# Patient Record
Sex: Female | Born: 1975 | Race: White | Hispanic: No | Marital: Married | Smoking: Never smoker
Health system: Southern US, Community
[De-identification: ages and names within clinical notes are randomized; demographics above are authoritative.]

---

## 2003-12-05 ENCOUNTER — Other Ambulatory Visit: Admission: RE | Admit: 2003-12-05 | Discharge: 2003-12-05 | Payer: Self-pay | Admitting: Obstetrics and Gynecology

## 2005-02-15 ENCOUNTER — Other Ambulatory Visit: Admission: RE | Admit: 2005-02-15 | Discharge: 2005-02-15 | Payer: Self-pay | Admitting: Obstetrics and Gynecology

## 2008-07-31 ENCOUNTER — Inpatient Hospital Stay (HOSPITAL_COMMUNITY): Admission: AD | Admit: 2008-07-31 | Discharge: 2008-08-04 | Payer: Self-pay | Admitting: Obstetrics and Gynecology

## 2010-12-29 NOTE — Op Note (Signed)
Monique [name], Monique [name]            ACCOUNT NO.:  [id number]   MEDICAL RECORD NO.:  [id number]          PATIENT TYPE:  INP   LOCATION:  9123                          FACILITY:  [facility]   PHYSICIAN:  Hal Morales, M.D.DATE OF BIRTH:  1975/10/09   DATE OF PROCEDURE:  07/31/2008  DATE OF DISCHARGE:                               OPERATIVE REPORT   PREOPERATIVE DIAGNOSES:  1. Intrauterine pregnancy at 70 weeks' gestation.  2. Premature rupture of membranes.  3. Failure to progress in labor.   POSTOPERATIVE DIAGNOSES:  1. Intrauterine pregnancy at 43 weeks' gestation.  2. Premature rupture of membranes.  3. Failure to progress in labor.  4,  Asynclitism.   SURGERY:  Primary low transverse cesarean section.   SURGEON:  Vanessa P. Pennie Rushing, MD   FIRST ASSISTANT:  Renaldo Reel. Emilee Hero, certified nurse midwife.   ANESTHESIA:  Epidural.   ESTIMATED BLOOD LOSS:  800 mL.   COMPLICATIONS:  None.   FINDINGS:  The patient was delivered of a female infant weighing 6 pounds  1 ounce with Apgars of 9 and 9 at 1 at 5 minutes respectively.  The  tubes and ovaries were normal for the gravid state.  The uterus  contained a 4-cm right fundal myoma.  The skin contained a 2-mm variably  pigmented lesion in the midline approximately 2 cm above the incision.  The patient declined to have this lesion removed.   PROCEDURE:  The patient was taken to the operating room after  appropriate identification and placed on the operating table.  After the  attainment of adequate surgical anesthesia by dosing the patient's labor  epidural, the abdomen was prepped with multiple layers of Betadine and  draped as a sterile field.  The suprapubic region was infiltrated with  20 mL of 0.25% Marcaine after assurance of adequate surgical anesthesia.  A transverse incision was made and the abdomen opened in layers.  The  peritoneum was entered and the bladder blade placed.  The uterus was  incised approximately 2 cm above  the uterovesical fold and that incision  taken laterally on either side bluntly.  The infant was then delivered  from the occiput transverse position, and after having the nares and  pharynx suctioned and the cord clamped and cut, was handed off to the  awaiting pediatricians.  The placenta was allowed to spontaneously  detach from the uterus and was removed from the operative field, then  given to the employees of [facility] for cord blood  donation.  The uterine cavity was cleared of products of conception with  laparotomy pads.  The uterine incision was closed with a running  interlocking suture of 0 Vicryl.  An imbricating suture of 0 Vicryl was  then placed.  Copious irrigation was carried out.  The abdominal  peritoneum was closed with a running suture of 2-0 Vicryl.  The rectus  muscles were reapproximated in the midline with figure-of-eight suture  of 0 Vicryl.  The fascia was closed with a running suture of 0 Vicryl  then reinforced on either side of midline with figure-of-eight sutures  of 0  Vicryl.  The subcutaneous tissue was made hemostatic and irrigated.  The subcutaneous tissue was reapproximated with mattress sutures of 2-0  Vicryl.  The skin incision was reapproximated with subcuticular sutures  of 3-0 Monocryl.  Steri-Strips were applied as well as sterile dressing.  The patient was taken from the operating room to the recovery room in  satisfactory condition having tolerated the procedure well with sponge  and instrument counts correct.  The infant went to the full-term  nursery.      Hal Morales, M.D.  Electronically Signed     VPH/MEDQ  D:  08/01/2008  T:  08/01/2008  Job:  [id number]

## 2010-12-29 NOTE — Discharge Summary (Signed)
[name], Monique [name]            ACCOUNT NO.:  [id number]   MEDICAL RECORD NO.:  [id number]          PATIENT TYPE:  INP   LOCATION:  9123                          [facility]ACILITY:  [facility]   PHYSICIAN:  Janine Limbo, M.D.DATE OF BIRTH:  1976/06/11   DATE OF ADMISSION:  07/31/2008  DATE OF DISCHARGE:  08/04/2008                               DISCHARGE SUMMARY   DISCHARGING PHYSICIAN:  Sherron Monday, MD   ADMISSION DIAGNOSES:  1. Intrauterine pregnancy at 38 weeks, spontaneous rupture of      membranes.  2. Group B streptococcus negative.   DISCHARGE DIAGNOSES:  1. Intrauterine pregnancy at 38 weeks, spontaneous rupture of      membranes.  2. Group B streptococcus negative.  3. Failure to progress in labor.  4. Status post cesarean delivery of a female infant weighing 6 pounds 11      ounces, Apgars 9 and 9.   HOSPITAL PROCEDURES:  1. Epidural anesthesia.  2. Electronic fetal monitoring.  3. Low-dose Pitocin augmentation of labor.  4. Amnioinfusion.  5. Primary low transverse cesarean section of a female infant weighing 6      pounds 11 ounces, Apgars 9 and 9.   HOSPITAL COURSE:  The patient was admitted with rupture of membranes on  July 31, 2008.  Cervix was fingertip and 50% at that time.  She  began with observation and progressed to low-dose Pitocin augmentation.  She proceeded to 4 cm by middle of the day on July 31, 2008, and by  that evening got to 6 to 7 cm and then proceeded to 7 cm by 9:30 p.m.  and by midnight or so was found to be unchanged in her cervix, and  decision was made to proceed with cesarean section, which was performed  by Dr. Pennie Rushing under epidural anesthesia for a female infant weighing 6  pounds 11 ounces, Apgars 9 and 9.  She was taken to recovery and then to  [facility], where she received routine care.  On postoperative day #1,  she was doing well.  She was afebrile.  Hemoglobin was 10.4.  She was  breast-feeding.  She had some increased gas on  that day and was treated  with Dulcolax suppositories and ambulation.  On postop day #2, she was  doing better, passed gas and loose stools.  She was afebrile, and she  continued to breast-feed and ambulate.  On postop day #3, she was ready  to go home, breast-feeding well.  Vital signs were stable.  Chest was  clear.  Heart: Regular rate and rhythm.  Abdomen was soft and  appropriately tender.  Incision was clean, dry, and intact.  Lochia was  normal, and she was deemed to have received full benefit of her hospital  stay and was discharged home.   DISCHARGE MEDICATIONS:  1. Motrin 600 mg p.o. q.6 h. p.r.n.  2. Tylox 1-2 p.o. q.4 h. p.r.n.   DISCHARGE LABORATORIES:  White blood cell count 12.3, hemoglobin 10.4,  and platelets 184.   DISCHARGE INSTRUCTIONS:  Per [facility] handout.  Discharge followup in 6 weeks  or p.r.n. and condition at  discharge is good.      Marie L. Mayford Knife, C.N.M.      ______________________________  Janine Limbo, M.D.    MLW/MEDQ  D:  08/04/2008  T:  08/04/2008  Job:  [id number]

## 2010-12-29 NOTE — H&P (Signed)
NAMEJOANY, KHATIB            ACCOUNT NO.:  [id number]   MEDICAL RECORD NO.:  [id number]         PATIENT TYPE:  WINP   LOCATION:  170                           [facility]ILITY:  [facility]   PHYSICIAN:  Janine Limbo, M.D.DATE OF BIRTH:  06-13-1976   DATE OF ADMISSION:  07/31/2008  DATE OF DISCHARGE:                              HISTORY & PHYSICAL   Monique [name] is a 35 year old gravida 1, para 0 at 56 weeks who presents  status post spontaneous rupture of membranes at 2:00 a.m. with clear  fluid noted and uterine contractions, unknown status.  She reports  positive fetal movement.  She denies bleeding.  She was seen in the  office on July 29, 2008, with a mild elevation of her blood  pressure.  She was to have a recheck on August 01, 2008.  She denies  headache, visual symptoms or epigastric pain.  Pregnancy has been  remarkable for:  1. Group B strep negative.  2. Fibroid noted on 18-week ultrasound.   PRENATAL LABS:  Blood type is O+, Rh antibody negative, VDRL  nonreactive, rubella titer positive, hepatitis B surface antigen  negative, HIV was nonreactive.  GC and Chlamydia cultures were declined  in the first and third trimester.  Cystic fibrosis testing was declined.  Hemoglobin upon entering the practice was 12.8 and was 11.3 at 27 weeks.  She declined first trimester screening.  She had a normal Glucola.  She  had a negative group B strep.   HISTORY OF PRESENT PREGNANCY:  The patient entered care at approximately  10 weeks.  She traveled to [place] after her first visit for  approximately 2 months.  She saw a family practitioner there for visits  during that time away.  We did an ultrasound at her second visit prior  to her leaving for dating purposes and verification of status.  It  confirmed an [facility] of August 15, 2008.  She declined genetic screening.  She had a 4 cm fibroid noted on that 12-week ultrasound.  She returned  in August at 19 weeks.  She had her anatomy  scan at that time showing an  anterior placenta, normal cervical length, normal growth and  development.  The fibroid had not changed in size since her last exam.  She had a normal Glucola and hemoglobin was 11.3 at 29 weeks.  She  declined H1N1 vaccine.  Group B strep culture was negative at 35 weeks.  At 37-5/7 weeks, she was seen in the office and had an initial blood  pressure of 120/100, but then had a repeat of 120/80.  She had no  protein in her urine.  She had no headache, visual symptoms or  epigastric pain.  The cervix at that time was fingertip, vertex, -2 to -  3.  She was placed on increased rest and was planned to return on  August 01, 2008, for recheck and there was an ultrasound ordered for  position.   OBSTETRICAL HISTORY:  The patient is a primigravida.   MEDICAL HISTORY:  She has history of the usual childhood illnesses.  She  has an indoor  cataract, but does not have any litter.   SURGICAL HISTORY:  Adenoids removed and tube in her ears at age 33.   ALLERGIES:  SHE HAS NO KNOWN MEDICATION ALLERGIES.   FAMILY HISTORY:  Her father has varicose veins.  Her mother had  lymphoma.   GENETIC HISTORY:  Unremarkable.   SOCIAL HISTORY:  The patient is married to the father of baby.  He is  involved and supportive, his name is Monique [name].  The patient is  Caucasian.  She denies a religious affiliation.  She is from [place].  The patient has her bachelor's degree, she is a Runner, broadcasting/film/video.  Her husband  has some college, but he is currently unemployed.  Patient denies any  alcohol, drug or tobacco use during this pregnancy.  She has been  followed by the certified nurse midwife service at [facility].   PHYSICAL EXAMINATION:  VITAL SIGNS:  Blood pressure is 124/72 and  132/85.  Other vital signs are stable.  HEENT:  Within normal limits.  LUNGS:  Breath sounds are clear.  HEART:  Regular rate and rhythm without murmur.  BREASTS:  Soft and nontender.  ABDOMEN:   Fundal height is approximately 38 cm.  Estimated fetal weight  is 7 pounds.  Uterine contractions are every 3 minutes, mild quality.  The patient is known to be leaking clear fluid with positive fern,  positive pooling and positive Nitrazine.  Fetal heart rate is reactive  with no decelerations.  There is a negative spontaneous CST.  The cervix  is a fingertip, 50%, vertex, -2 with vertex verified by bedside  ultrasound.  Urine is negative for protein.  Deep tendon reflexes are 2+  without clonus.  There is no edema noted.   IMPRESSION:  1. Intrauterine pregnancy at 38 weeks.  2. Spontaneous rupture of membranes with early labor.  3. Group B strep negative.   PLAN:  1. Admit to birthing suite per consult with Dr. Stefano Gaul as attending      physician.  2. Routine certified nurse midwife orders.  3. Will observe at present and augment p.r.n.  The patient understands      that we may need to augment if no change in labor status.  4. Declines need for pain medication at present, but is open to using      pain medication as labor advances.      Monique [name], C.N.M.      Janine Limbo, M.D.  Electronically Signed    VLL/MEDQ  D:  07/31/2008  T:  07/31/2008  Job:  [id number]

## 2011-05-21 LAB — URINALYSIS, ROUTINE W REFLEX MICROSCOPIC
Glucose, UA: NEGATIVE mg/dL
pH: 5.5 (ref 5.0–8.0)

## 2011-05-21 LAB — CBC
Hemoglobin: 10.4 g/dL — ABNORMAL LOW (ref 12.0–15.0)
MCHC: 33.8 g/dL (ref 30.0–36.0)
Platelets: 219 10*3/uL (ref 150–400)
RBC: 3.42 MIL/uL — ABNORMAL LOW (ref 3.87–5.11)
RDW: 13.8 % (ref 11.5–15.5)
WBC: 12.3 10*3/uL — ABNORMAL HIGH (ref 4.0–10.5)
WBC: 6.3 10*3/uL (ref 4.0–10.5)

## 2011-05-21 LAB — URINE MICROSCOPIC-ADD ON

## 2011-05-21 LAB — RPR: RPR Ser Ql: NONREACTIVE

## 2015-08-26 ENCOUNTER — Other Ambulatory Visit: Payer: Self-pay | Admitting: Family Medicine

## 2015-08-26 ENCOUNTER — Ambulatory Visit
Admission: RE | Admit: 2015-08-26 | Discharge: 2015-08-26 | Disposition: A | Discharge status: Home / Self Care | Payer: BC Managed Care – PPO | Source: Ambulatory Visit | Attending: Family Medicine | Admitting: Family Medicine

## 2015-08-26 DIAGNOSIS — R062 Wheezing: Secondary | ICD-10-CM

## 2015-08-29 ENCOUNTER — Emergency Department (HOSPITAL_COMMUNITY): Payer: BC Managed Care – PPO

## 2015-08-29 ENCOUNTER — Inpatient Hospital Stay (HOSPITAL_COMMUNITY)
Admission: EM | Admit: 2015-08-29 | Discharge: 2015-10-15 | DRG: 853 | Disposition: E | Payer: BC Managed Care – PPO | Attending: Pulmonary Disease | Admitting: Pulmonary Disease

## 2015-08-29 ENCOUNTER — Encounter (HOSPITAL_COMMUNITY): Payer: Self-pay | Admitting: Emergency Medicine

## 2015-08-29 DIAGNOSIS — R918 Other nonspecific abnormal finding of lung field: Secondary | ICD-10-CM | POA: Diagnosis not present

## 2015-08-29 DIAGNOSIS — IMO0002 Reserved for concepts with insufficient information to code with codable children: Secondary | ICD-10-CM

## 2015-08-29 DIAGNOSIS — J9602 Acute respiratory failure with hypercapnia: Secondary | ICD-10-CM | POA: Diagnosis not present

## 2015-08-29 DIAGNOSIS — D630 Anemia in neoplastic disease: Secondary | ICD-10-CM | POA: Diagnosis present

## 2015-08-29 DIAGNOSIS — J90 Pleural effusion, not elsewhere classified: Secondary | ICD-10-CM | POA: Diagnosis not present

## 2015-08-29 DIAGNOSIS — Z515 Encounter for palliative care: Secondary | ICD-10-CM | POA: Diagnosis present

## 2015-08-29 DIAGNOSIS — Z807 Family history of other malignant neoplasms of lymphoid, hematopoietic and related tissues: Secondary | ICD-10-CM

## 2015-08-29 DIAGNOSIS — N179 Acute kidney failure, unspecified: Secondary | ICD-10-CM

## 2015-08-29 DIAGNOSIS — I468 Cardiac arrest due to other underlying condition: Secondary | ICD-10-CM | POA: Diagnosis not present

## 2015-08-29 DIAGNOSIS — R531 Weakness: Secondary | ICD-10-CM

## 2015-08-29 DIAGNOSIS — E878 Other disorders of electrolyte and fluid balance, not elsewhere classified: Secondary | ICD-10-CM | POA: Diagnosis present

## 2015-08-29 DIAGNOSIS — Z4659 Encounter for fitting and adjustment of other gastrointestinal appliance and device: Secondary | ICD-10-CM

## 2015-08-29 DIAGNOSIS — Z452 Encounter for adjustment and management of vascular access device: Secondary | ICD-10-CM

## 2015-08-29 DIAGNOSIS — E8770 Fluid overload, unspecified: Secondary | ICD-10-CM | POA: Diagnosis present

## 2015-08-29 DIAGNOSIS — R0902 Hypoxemia: Secondary | ICD-10-CM

## 2015-08-29 DIAGNOSIS — C3490 Malignant neoplasm of unspecified part of unspecified bronchus or lung: Secondary | ICD-10-CM

## 2015-08-29 DIAGNOSIS — J9601 Acute respiratory failure with hypoxia: Secondary | ICD-10-CM

## 2015-08-29 DIAGNOSIS — R591 Generalized enlarged lymph nodes: Secondary | ICD-10-CM | POA: Insufficient documentation

## 2015-08-29 DIAGNOSIS — R0600 Dyspnea, unspecified: Secondary | ICD-10-CM | POA: Diagnosis not present

## 2015-08-29 DIAGNOSIS — C801 Malignant (primary) neoplasm, unspecified: Secondary | ICD-10-CM

## 2015-08-29 DIAGNOSIS — Y95 Nosocomial condition: Secondary | ICD-10-CM | POA: Diagnosis present

## 2015-08-29 DIAGNOSIS — G6281 Critical illness polyneuropathy: Secondary | ICD-10-CM | POA: Diagnosis not present

## 2015-08-29 DIAGNOSIS — C7989 Secondary malignant neoplasm of other specified sites: Secondary | ICD-10-CM | POA: Diagnosis present

## 2015-08-29 DIAGNOSIS — C771 Secondary and unspecified malignant neoplasm of intrathoracic lymph nodes: Secondary | ICD-10-CM | POA: Diagnosis present

## 2015-08-29 DIAGNOSIS — J9600 Acute respiratory failure, unspecified whether with hypoxia or hypercapnia: Secondary | ICD-10-CM

## 2015-08-29 DIAGNOSIS — R599 Enlarged lymph nodes, unspecified: Secondary | ICD-10-CM | POA: Insufficient documentation

## 2015-08-29 DIAGNOSIS — B3789 Other sites of candidiasis: Secondary | ICD-10-CM | POA: Diagnosis not present

## 2015-08-29 DIAGNOSIS — R229 Localized swelling, mass and lump, unspecified: Secondary | ICD-10-CM

## 2015-08-29 DIAGNOSIS — I959 Hypotension, unspecified: Secondary | ICD-10-CM | POA: Diagnosis not present

## 2015-08-29 DIAGNOSIS — G709 Myoneural disorder, unspecified: Secondary | ICD-10-CM | POA: Insufficient documentation

## 2015-08-29 DIAGNOSIS — E43 Unspecified severe protein-calorie malnutrition: Secondary | ICD-10-CM | POA: Diagnosis present

## 2015-08-29 DIAGNOSIS — C799 Secondary malignant neoplasm of unspecified site: Secondary | ICD-10-CM | POA: Insufficient documentation

## 2015-08-29 DIAGNOSIS — B370 Candidal stomatitis: Secondary | ICD-10-CM | POA: Diagnosis not present

## 2015-08-29 DIAGNOSIS — R197 Diarrhea, unspecified: Secondary | ICD-10-CM | POA: Diagnosis present

## 2015-08-29 DIAGNOSIS — A419 Sepsis, unspecified organism: Principal | ICD-10-CM | POA: Diagnosis present

## 2015-08-29 DIAGNOSIS — B3749 Other urogenital candidiasis: Secondary | ICD-10-CM | POA: Diagnosis not present

## 2015-08-29 DIAGNOSIS — C7A8 Other malignant neuroendocrine tumors: Secondary | ICD-10-CM | POA: Diagnosis present

## 2015-08-29 DIAGNOSIS — I82402 Acute embolism and thrombosis of unspecified deep veins of left lower extremity: Secondary | ICD-10-CM | POA: Diagnosis not present

## 2015-08-29 DIAGNOSIS — G9340 Encephalopathy, unspecified: Secondary | ICD-10-CM | POA: Diagnosis not present

## 2015-08-29 DIAGNOSIS — Z8249 Family history of ischemic heart disease and other diseases of the circulatory system: Secondary | ICD-10-CM

## 2015-08-29 DIAGNOSIS — T8579XA Infection and inflammatory reaction due to other internal prosthetic devices, implants and grafts, initial encounter: Secondary | ICD-10-CM

## 2015-08-29 DIAGNOSIS — Z79899 Other long term (current) drug therapy: Secondary | ICD-10-CM

## 2015-08-29 DIAGNOSIS — Z789 Other specified health status: Secondary | ICD-10-CM | POA: Insufficient documentation

## 2015-08-29 DIAGNOSIS — J9690 Respiratory failure, unspecified, unspecified whether with hypoxia or hypercapnia: Secondary | ICD-10-CM

## 2015-08-29 DIAGNOSIS — Z87891 Personal history of nicotine dependence: Secondary | ICD-10-CM

## 2015-08-29 DIAGNOSIS — D6959 Other secondary thrombocytopenia: Secondary | ICD-10-CM | POA: Diagnosis present

## 2015-08-29 DIAGNOSIS — R000 Tachycardia, unspecified: Secondary | ICD-10-CM

## 2015-08-29 DIAGNOSIS — N170 Acute kidney failure with tubular necrosis: Secondary | ICD-10-CM | POA: Diagnosis present

## 2015-08-29 DIAGNOSIS — E876 Hypokalemia: Secondary | ICD-10-CM | POA: Diagnosis present

## 2015-08-29 DIAGNOSIS — J189 Pneumonia, unspecified organism: Secondary | ICD-10-CM | POA: Diagnosis present

## 2015-08-29 DIAGNOSIS — N858 Other specified noninflammatory disorders of uterus: Secondary | ICD-10-CM

## 2015-08-29 DIAGNOSIS — D61810 Antineoplastic chemotherapy induced pancytopenia: Secondary | ICD-10-CM | POA: Diagnosis not present

## 2015-08-29 DIAGNOSIS — E874 Mixed disorder of acid-base balance: Secondary | ICD-10-CM | POA: Diagnosis present

## 2015-08-29 DIAGNOSIS — E870 Hyperosmolality and hypernatremia: Secondary | ICD-10-CM | POA: Diagnosis present

## 2015-08-29 DIAGNOSIS — Z7951 Long term (current) use of inhaled steroids: Secondary | ICD-10-CM

## 2015-08-29 DIAGNOSIS — C772 Secondary and unspecified malignant neoplasm of intra-abdominal lymph nodes: Secondary | ICD-10-CM | POA: Diagnosis present

## 2015-08-29 DIAGNOSIS — C7800 Secondary malignant neoplasm of unspecified lung: Secondary | ICD-10-CM | POA: Diagnosis present

## 2015-08-29 DIAGNOSIS — K567 Ileus, unspecified: Secondary | ICD-10-CM

## 2015-08-29 DIAGNOSIS — L89152 Pressure ulcer of sacral region, stage 2: Secondary | ICD-10-CM | POA: Diagnosis present

## 2015-08-29 DIAGNOSIS — T451X5A Adverse effect of antineoplastic and immunosuppressive drugs, initial encounter: Secondary | ICD-10-CM | POA: Diagnosis not present

## 2015-08-29 DIAGNOSIS — Z978 Presence of other specified devices: Secondary | ICD-10-CM

## 2015-08-29 DIAGNOSIS — D72829 Elevated white blood cell count, unspecified: Secondary | ICD-10-CM | POA: Diagnosis present

## 2015-08-29 DIAGNOSIS — Z66 Do not resuscitate: Secondary | ICD-10-CM | POA: Diagnosis present

## 2015-08-29 DIAGNOSIS — I82621 Acute embolism and thrombosis of deep veins of right upper extremity: Secondary | ICD-10-CM | POA: Diagnosis not present

## 2015-08-29 DIAGNOSIS — D72828 Other elevated white blood cell count: Secondary | ICD-10-CM | POA: Diagnosis not present

## 2015-08-29 DIAGNOSIS — G893 Neoplasm related pain (acute) (chronic): Secondary | ICD-10-CM | POA: Diagnosis present

## 2015-08-29 DIAGNOSIS — T17890A Other foreign object in other parts of respiratory tract causing asphyxiation, initial encounter: Secondary | ICD-10-CM | POA: Diagnosis not present

## 2015-08-29 DIAGNOSIS — I82409 Acute embolism and thrombosis of unspecified deep veins of unspecified lower extremity: Secondary | ICD-10-CM

## 2015-08-29 DIAGNOSIS — C7951 Secondary malignant neoplasm of bone: Secondary | ICD-10-CM | POA: Diagnosis present

## 2015-08-29 DIAGNOSIS — R29898 Other symptoms and signs involving the musculoskeletal system: Secondary | ICD-10-CM | POA: Insufficient documentation

## 2015-08-29 DIAGNOSIS — R4182 Altered mental status, unspecified: Secondary | ICD-10-CM

## 2015-08-29 DIAGNOSIS — R569 Unspecified convulsions: Secondary | ICD-10-CM | POA: Diagnosis not present

## 2015-08-29 DIAGNOSIS — R34 Anuria and oliguria: Secondary | ICD-10-CM | POA: Diagnosis present

## 2015-08-29 DIAGNOSIS — R6521 Severe sepsis with septic shock: Secondary | ICD-10-CM | POA: Diagnosis present

## 2015-08-29 DIAGNOSIS — L8990 Pressure ulcer of unspecified site, unspecified stage: Secondary | ICD-10-CM | POA: Insufficient documentation

## 2015-08-29 LAB — CBC WITH DIFFERENTIAL/PLATELET
BASOS ABS: 0 10*3/uL (ref 0.0–0.1)
Basophils Relative: 0 %
EOS ABS: 0 10*3/uL (ref 0.0–0.7)
EOS PCT: 0 %
HCT: 39.3 % (ref 36.0–46.0)
Hemoglobin: 13 g/dL (ref 12.0–15.0)
Lymphocytes Relative: 5 %
Lymphs Abs: 0.9 10*3/uL (ref 0.7–4.0)
MCH: 27.4 pg (ref 26.0–34.0)
MCHC: 33.1 g/dL (ref 30.0–36.0)
MCV: 82.7 fL (ref 78.0–100.0)
MONO ABS: 1.8 10*3/uL — AB (ref 0.1–1.0)
Monocytes Relative: 11 %
Neutro Abs: 14.1 10*3/uL — ABNORMAL HIGH (ref 1.7–7.7)
Neutrophils Relative %: 84 %
PLATELETS: 461 10*3/uL — AB (ref 150–400)
RBC: 4.75 MIL/uL (ref 3.87–5.11)
RDW: 13.4 % (ref 11.5–15.5)
WBC: 16.9 10*3/uL — AB (ref 4.0–10.5)

## 2015-08-29 LAB — COMPREHENSIVE METABOLIC PANEL
ALT: 16 U/L (ref 14–54)
AST: 68 U/L — AB (ref 15–41)
Albumin: 3.6 g/dL (ref 3.5–5.0)
Alkaline Phosphatase: 70 U/L (ref 38–126)
Anion gap: 14 (ref 5–15)
BUN: 15 mg/dL (ref 6–20)
CHLORIDE: 100 mmol/L — AB (ref 101–111)
CO2: 22 mmol/L (ref 22–32)
CREATININE: 0.8 mg/dL (ref 0.44–1.00)
Calcium: 9.7 mg/dL (ref 8.9–10.3)
Glucose, Bld: 119 mg/dL — ABNORMAL HIGH (ref 65–99)
POTASSIUM: 4.1 mmol/L (ref 3.5–5.1)
SODIUM: 136 mmol/L (ref 135–145)
Total Bilirubin: 1.2 mg/dL (ref 0.3–1.2)
Total Protein: 7.4 g/dL (ref 6.5–8.1)

## 2015-08-29 LAB — I-STAT CG4 LACTIC ACID, ED
LACTIC ACID, VENOUS: 2.94 mmol/L — AB (ref 0.5–2.0)
Lactic Acid, Venous: 2.17 mmol/L (ref 0.5–2.0)

## 2015-08-29 MED ORDER — SODIUM CHLORIDE 0.9 % IV BOLUS (SEPSIS)
500.0000 mL | Freq: Once | INTRAVENOUS | Status: AC
  Administered 2015-08-29: 500 mL via INTRAVENOUS

## 2015-08-29 MED ORDER — CEFTRIAXONE SODIUM 1 G IJ SOLR
1.0000 g | Freq: Once | INTRAMUSCULAR | Status: AC
  Administered 2015-08-29: 1 g via INTRAVENOUS
  Filled 2015-08-29: qty 10

## 2015-08-29 MED ORDER — ALBUTEROL (5 MG/ML) CONTINUOUS INHALATION SOLN
10.0000 mg/h | INHALATION_SOLUTION | Freq: Once | RESPIRATORY_TRACT | Status: AC
  Administered 2015-08-29: 10 mg/h via RESPIRATORY_TRACT
  Filled 2015-08-29: qty 20

## 2015-08-29 MED ORDER — SODIUM CHLORIDE 0.9 % IV SOLN
INTRAVENOUS | Status: DC
  Administered 2015-08-29: 18:00:00 via INTRAVENOUS

## 2015-08-29 MED ORDER — IOHEXOL 350 MG/ML SOLN
100.0000 mL | Freq: Once | INTRAVENOUS | Status: AC | PRN
  Administered 2015-08-29: 100 mL via INTRAVENOUS

## 2015-08-29 MED ORDER — AZITHROMYCIN 500 MG IV SOLR
500.0000 mg | Freq: Once | INTRAVENOUS | Status: AC
  Administered 2015-08-29: 500 mg via INTRAVENOUS
  Filled 2015-08-29: qty 500

## 2015-08-29 NOTE — ED Notes (Signed)
Pt, being sent by [facility], c/o SOB x weeks and PNA x 4 days.  O2 Sat 91% RA.  Pt given a breathing treatment in office.  Pt has been taking prescriptions w/o relief.

## 2015-08-29 NOTE — ED Provider Notes (Signed)
CSN: [id number]     Arrival date & time 08/25/2015  1354 History   First MD Initiated Contact with Patient 09/03/2015 1636     Chief Complaint  Patient presents with  . PNA      HPI Pt, being sent by [facility], c/o SOB x weeks and PNA x 4 days. O2 Sat 91% RA. Pt given a breathing treatment in office. Pt has been taking prescriptions w/o relief. History reviewed. No pertinent past medical history. History reviewed. No pertinent past surgical history. Family History  Problem Relation Age of Onset  . Lymphoma Mother   . CAD Mother    Social History  Substance Use Topics  . Smoking status: Never Smoker   . Smokeless tobacco: None  . Alcohol Use: Yes     Comment: rare   OB History    No data available     Review of Systems  All other systems reviewed and are negative  Allergies  Review of patient's allergies indicates no known allergies.  Home Medications   Prior to Admission medications   Medication Sig Start Date End Date Taking? Authorizing Provider  albuterol (PROVENTIL HFA;VENTOLIN HFA) 108 (90 Base) MCG/ACT inhaler Inhale 2 puffs into the lungs every 4 (four) hours as needed for wheezing or shortness of breath.   Yes Historical Provider, MD  Ascorbic Acid (VITAMIN C PO) Take 1 tablet by mouth daily.   Yes Historical Provider, MD  budesonide-formoterol (SYMBICORT) 160-4.5 MCG/ACT inhaler Inhale 2 puffs into the lungs 2 (two) times daily.   Yes Historical Provider, MD  Cholecalciferol (VITAMIN D PO) Take 1 tablet by mouth daily.   Yes Historical Provider, MD  clarithromycin (BIAXIN) 500 MG tablet Take 500 mg by mouth 2 (two) times daily. For 10 days 08/26/15-09/05/15 08/26/15  Yes Historical Provider, MD  levofloxacin (LEVAQUIN) 500 MG tablet Take 500 mg by mouth daily. Reported on 08/28/2015 08/18/15   Historical Provider, MD   BP 115/68 mmHg  Pulse 134  Temp(Src) 98.7 F (37.1 C) (Axillary)  Resp 20  Ht 5' 5.5" (1.664 m)  Wt 190 lb 0.6 oz (86.2 kg)  BMI 31.13 kg/m2  SpO2  93%  LMP 08/17/2015 Physical Exam Physical Exam  Nursing note and vitals reviewed. Constitutional: She is oriented to person, place, and time. She appears well-developed and well-nourished. No distress.  HENT:  Head: Normocephalic and atraumatic.  Eyes: Pupils are equal, round, and reactive to light.  Neck: Normal range of motion.  Cardiovascular: Normal rate and intact distal pulses.   Pulmonary/Chest: No respiratory distress.  scattered expiratory wheezes to auscultation. Abdominal: Normal appearance. She exhibits no distension.  Musculoskeletal: Normal range of motion.  Neurological: She is alert and oriented to person, place, and time. No cranial nerve deficit.  Skin: Skin is warm and dry. No rash noted.  Psychiatric: She has a normal mood and affect. Her behavior is normal.   ED Course  Procedures (including critical care time) Labs Review Labs Reviewed  CBC WITH DIFFERENTIAL/PLATELET - Abnormal; Notable for the following:    WBC 16.9 (*)    Platelets 461 (*)    Neutro Abs 14.1 (*)    Monocytes Absolute 1.8 (*)    All other components within normal limits  COMPREHENSIVE METABOLIC PANEL - Abnormal; Notable for the following:    Chloride 100 (*)    Glucose, Bld 119 (*)    AST 68 (*)    All other components within normal limits  COMPREHENSIVE METABOLIC PANEL - Abnormal; Notable for  the following:    Sodium 132 (*)    Chloride 97 (*)    Albumin 3.3 (*)    AST 63 (*)    Total Bilirubin 1.4 (*)    All other components within normal limits  CBC - Abnormal; Notable for the following:    WBC 17.4 (*)    All other components within normal limits  CA 125 - Abnormal; Notable for the following:    CA 125 780.8 (*)    All other components within normal limits  CANCER ANTIGEN 19-9 - Abnormal; Notable for the following:    CA 19-9 453 (*)    All other components within normal limits  COMPREHENSIVE METABOLIC PANEL - Abnormal; Notable for the following:    Chloride 95 (*)     Glucose, Bld 123 (*)    Albumin 2.7 (*)    AST 63 (*)    All other components within normal limits  CBC WITH DIFFERENTIAL/PLATELET - Abnormal; Notable for the following:    WBC 18.7 (*)    Hemoglobin 10.8 (*)    HCT 33.4 (*)    Platelets 405 (*)    Neutro Abs 16.2 (*)    Monocytes Absolute 1.6 (*)    All other components within normal limits  BASIC METABOLIC PANEL - Abnormal; Notable for the following:    Sodium 134 (*)    Potassium 3.2 (*)    Chloride 91 (*)    Glucose, Bld 106 (*)    Anion gap 16 (*)    All other components within normal limits  CBC - Abnormal; Notable for the following:    WBC 17.7 (*)    Hemoglobin 10.7 (*)    HCT 33.5 (*)    Platelets 416 (*)    All other components within normal limits  LACTATE DEHYDROGENASE - Abnormal; Notable for the following:    LDH 554 (*)    All other components within normal limits  CBC - Abnormal; Notable for the following:    WBC 17.5 (*)    Hemoglobin 10.8 (*)    HCT 34.1 (*)    Platelets 408 (*)    All other components within normal limits  BASIC METABOLIC PANEL - Abnormal; Notable for the following:    Sodium 134 (*)    Chloride 91 (*)    Glucose, Bld 102 (*)    Anion gap 16 (*)    All other components within normal limits  CBC - Abnormal; Notable for the following:    WBC 18.0 (*)    Hemoglobin 10.8 (*)    HCT 34.1 (*)    Platelets 406 (*)    All other components within normal limits  TRIGLYCERIDES - Abnormal; Notable for the following:    Triglycerides 170 (*)    All other components within normal limits  BLOOD GAS, ARTERIAL - Abnormal; Notable for the following:    pO2, Arterial 142 (*)    Bicarbonate 28.0 (*)    Acid-Base Excess 4.1 (*)    All other components within normal limits  BASIC METABOLIC PANEL - Abnormal; Notable for the following:    Chloride 97 (*)    Glucose, Bld 101 (*)    BUN 31 (*)    Anion gap 16 (*)    All other components within normal limits  CBC - Abnormal; Notable for the  following:    WBC 16.4 (*)    Hemoglobin 10.8 (*)    HCT 34.7 (*)    Platelets 408 (*)  All other components within normal limits  BLOOD GAS, ARTERIAL - Abnormal; Notable for the following:    pH, Arterial 7.518 (*)    pCO2 arterial 33.4 (*)    pO2, Arterial 52.4 (*)    Bicarbonate 27.0 (*)    Acid-Base Excess 4.5 (*)    All other components within normal limits  BLOOD GAS, ARTERIAL - Abnormal; Notable for the following:    pH, Arterial 7.474 (*)    pO2, Arterial 60.9 (*)    Bicarbonate 27.9 (*)    Acid-Base Excess 4.5 (*)    All other components within normal limits  GLUCOSE, CAPILLARY - Abnormal; Notable for the following:    Glucose-Capillary 108 (*)    All other components within normal limits  BASIC METABOLIC PANEL - Abnormal; Notable for the following:    Chloride 99 (*)    Glucose, Bld 144 (*)    BUN 58 (*)    Creatinine, Ser 1.70 (*)    GFR calc non Af Amer 37 (*)    GFR calc Af Amer 42 (*)    All other components within normal limits  CBC - Abnormal; Notable for the following:    WBC 21.1 (*)    RBC 3.86 (*)    Hemoglobin 10.2 (*)    HCT 32.8 (*)    All other components within normal limits  GLUCOSE, CAPILLARY - Abnormal; Notable for the following:    Glucose-Capillary 132 (*)    All other components within normal limits  GLUCOSE, CAPILLARY - Abnormal; Notable for the following:    Glucose-Capillary 103 (*)    All other components within normal limits  GLUCOSE, CAPILLARY - Abnormal; Notable for the following:    Glucose-Capillary 123 (*)    All other components within normal limits  URINALYSIS, ROUTINE W REFLEX MICROSCOPIC (NOT AT [facility]) - Abnormal; Notable for the following:    APPearance CLOUDY (*)    Hgb urine dipstick MODERATE (*)    Leukocytes, UA SMALL (*)    All other components within normal limits  LACTIC ACID, PLASMA - Abnormal; Notable for the following:    Lactic Acid, Venous 3.2 (*)    All other components within normal limits  GLUCOSE,  CAPILLARY - Abnormal; Notable for the following:    Glucose-Capillary 136 (*)    All other components within normal limits  URINE MICROSCOPIC-ADD ON - Abnormal; Notable for the following:    Squamous Epithelial / LPF 0-5 (*)    Bacteria, UA FEW (*)    All other components within normal limits  GLUCOSE, CAPILLARY - Abnormal; Notable for the following:    Glucose-Capillary 138 (*)    All other components within normal limits  HEPATIC FUNCTION PANEL - Abnormal; Notable for the following:    Total Protein 5.8 (*)    Albumin 1.7 (*)    AST 128 (*)    Alkaline Phosphatase 131 (*)    Total Bilirubin 1.5 (*)    Bilirubin, Direct 0.9 (*)    All other components within normal limits  LIPASE, BLOOD - Abnormal; Notable for the following:    Lipase 134 (*)    All other components within normal limits  LACTIC ACID, PLASMA - Abnormal; Notable for the following:    Lactic Acid, Venous 2.2 (*)    All other components within normal limits  GLUCOSE, CAPILLARY - Abnormal; Notable for the following:    Glucose-Capillary 139 (*)    All other components within normal limits  TRIGLYCERIDES - Abnormal;  Notable for the following:    Triglycerides 319 (*)    All other components within normal limits  GLUCOSE, CAPILLARY - Abnormal; Notable for the following:    Glucose-Capillary 129 (*)    All other components within normal limits  GLUCOSE, CAPILLARY - Abnormal; Notable for the following:    Glucose-Capillary 366 (*)    All other components within normal limits  GLUCOSE, CAPILLARY - Abnormal; Notable for the following:    Glucose-Capillary 143 (*)    All other components within normal limits  CBC - Abnormal; Notable for the following:    WBC 20.0 (*)    RBC 3.50 (*)    Hemoglobin 9.3 (*)    HCT 31.0 (*)    All other components within normal limits  BASIC METABOLIC PANEL - Abnormal; Notable for the following:    Sodium 148 (*)    Glucose, Bld 150 (*)    BUN 66 (*)    Creatinine, Ser 1.44 (*)     Calcium 8.8 (*)    GFR calc non Af Amer 45 (*)    GFR calc Af Amer 52 (*)    All other components within normal limits  LACTIC ACID, PLASMA - Abnormal; Notable for the following:    Lactic Acid, Venous 2.6 (*)    All other components within normal limits  TROPONIN I - Abnormal; Notable for the following:    Troponin I 0.09 (*)    All other components within normal limits  TROPONIN I - Abnormal; Notable for the following:    Troponin I 0.09 (*)    All other components within normal limits  TROPONIN I - Abnormal; Notable for the following:    Troponin I 0.11 (*)    All other components within normal limits  GLUCOSE, CAPILLARY - Abnormal; Notable for the following:    Glucose-Capillary 137 (*)    All other components within normal limits  GLUCOSE, CAPILLARY - Abnormal; Notable for the following:    Glucose-Capillary 125 (*)    All other components within normal limits  BLOOD GAS, ARTERIAL - Abnormal; Notable for the following:    pH, Arterial 7.309 (*)    pCO2 arterial 55.0 (*)    pO2, Arterial 51.0 (*)    Bicarbonate 25.9 (*)    All other components within normal limits  GLUCOSE, CAPILLARY - Abnormal; Notable for the following:    Glucose-Capillary 129 (*)    All other components within normal limits  CBC - Abnormal; Notable for the following:    WBC 18.7 (*)    RBC 3.45 (*)    Hemoglobin 9.3 (*)    HCT 29.9 (*)    All other components within normal limits  BASIC METABOLIC PANEL - Abnormal; Notable for the following:    Sodium 154 (*)    Chloride 113 (*)    Glucose, Bld 128 (*)    BUN 64 (*)    Creatinine, Ser 1.36 (*)    Calcium 8.3 (*)    GFR calc non Af Amer 48 (*)    GFR calc Af Amer 56 (*)    Anion gap 16 (*)    All other components within normal limits  MAGNESIUM - Abnormal; Notable for the following:    Magnesium 2.9 (*)    All other components within normal limits  HEPATIC FUNCTION PANEL - Abnormal; Notable for the following:    Total Protein 6.1 (*)     Albumin 1.8 (*)    AST 140 (*)  Alkaline Phosphatase 128 (*)    Total Bilirubin 1.8 (*)    Bilirubin, Direct 0.9 (*)    All other components within normal limits  LACTIC ACID, PLASMA - Abnormal; Notable for the following:    Lactic Acid, Venous 2.6 (*)    All other components within normal limits  GLUCOSE, CAPILLARY - Abnormal; Notable for the following:    Glucose-Capillary 143 (*)    All other components within normal limits  GLUCOSE, CAPILLARY - Abnormal; Notable for the following:    Glucose-Capillary 120 (*)    All other components within normal limits  GLUCOSE, CAPILLARY - Abnormal; Notable for the following:    Glucose-Capillary 122 (*)    All other components within normal limits  GLUCOSE, CAPILLARY - Abnormal; Notable for the following:    Glucose-Capillary 133 (*)    All other components within normal limits  GLUCOSE, CAPILLARY - Abnormal; Notable for the following:    Glucose-Capillary 146 (*)    All other components within normal limits  CBC - Abnormal; Notable for the following:    WBC 24.0 (*)    RBC 3.23 (*)    Hemoglobin 8.7 (*)    HCT 28.6 (*)    RDW 15.6 (*)    All other components within normal limits  COMPREHENSIVE METABOLIC PANEL - Abnormal; Notable for the following:    Sodium 153 (*)    Chloride 113 (*)    Glucose, Bld 142 (*)    BUN 85 (*)    Creatinine, Ser 2.26 (*)    Total Protein 5.8 (*)    Albumin 1.7 (*)    AST 122 (*)    Alkaline Phosphatase 134 (*)    Total Bilirubin 1.4 (*)    GFR calc non Af Amer 26 (*)    GFR calc Af Amer 30 (*)    Anion gap 18 (*)    All other components within normal limits  GLUCOSE, CAPILLARY - Abnormal; Notable for the following:    Glucose-Capillary 123 (*)    All other components within normal limits  GLUCOSE, CAPILLARY - Abnormal; Notable for the following:    Glucose-Capillary 137 (*)    All other components within normal limits  GLUCOSE, CAPILLARY - Abnormal; Notable for the following:     Glucose-Capillary 133 (*)    All other components within normal limits  GLUCOSE, CAPILLARY - Abnormal; Notable for the following:    Glucose-Capillary 136 (*)    All other components within normal limits  VANCOMYCIN, TROUGH - Abnormal; Notable for the following:    Vancomycin Tr 36 (*)    All other components within normal limits  GLUCOSE, CAPILLARY - Abnormal; Notable for the following:    Glucose-Capillary 152 (*)    All other components within normal limits  GLUCOSE, CAPILLARY - Abnormal; Notable for the following:    Glucose-Capillary 144 (*)    All other components within normal limits  CBC - Abnormal; Notable for the following:    WBC 18.5 (*)    RBC 3.08 (*)    Hemoglobin 8.3 (*)    HCT 27.9 (*)    MCHC 29.7 (*)    RDW 16.1 (*)    All other components within normal limits  BASIC METABOLIC PANEL - Abnormal; Notable for the following:    Sodium 148 (*)    Glucose, Bld 140 (*)    BUN 108 (*)    Creatinine, Ser 2.55 (*)    GFR calc non Af Amer 22 (*)  GFR calc Af Amer 26 (*)    All other components within normal limits  MAGNESIUM - Abnormal; Notable for the following:    Magnesium 3.1 (*)    All other components within normal limits  LACTIC ACID, PLASMA - Abnormal; Notable for the following:    Lactic Acid, Venous 4.2 (*)    All other components within normal limits  GLUCOSE, CAPILLARY - Abnormal; Notable for the following:    Glucose-Capillary 117 (*)    All other components within normal limits  BLOOD GAS, ARTERIAL - Abnormal; Notable for the following:    pH, Arterial 7.260 (*)    pCO2 arterial 59.8 (*)    Bicarbonate 25.8 (*)    All other components within normal limits  GLUCOSE, CAPILLARY - Abnormal; Notable for the following:    Glucose-Capillary 143 (*)    All other components within normal limits  I-STAT CG4 LACTIC ACID, ED - Abnormal; Notable for the following:    Lactic Acid, Venous 2.94 (*)    All other components within normal limits  I-STAT CG4  LACTIC ACID, ED - Abnormal; Notable for the following:    Lactic Acid, Venous 2.17 (*)    All other components within normal limits  CULTURE, BLOOD (ROUTINE X 2)  CULTURE, BLOOD (ROUTINE X 2)  MRSA PCR SCREENING  CULTURE, RESPIRATORY (NON-EXPECTORATED)  C DIFFICILE QUICK SCREEN W PCR REFLEX  CULTURE, BLOOD (ROUTINE X 2)  CULTURE, BLOOD (ROUTINE X 2)  CULTURE, BAL-QUANTITATIVE  FUNGUS CULTURE W SMEAR  TSH  HIV ANTIBODY (ROUTINE TESTING)  MAGNESIUM  PHOSPHORUS  PROTIME-INR  STREP PNEUMONIAE URINARY ANTIGEN  LEGIONELLA ANTIGEN, URINE  CEA  VANCOMYCIN, TROUGH  PREGNANCY, URINE  AFP TUMOR MARKER  VANCOMYCIN, TROUGH  MAGNESIUM  PROCALCITONIN  PROCALCITONIN  AMYLASE  PROCALCITONIN  PHOSPHORUS  PHOSPHORUS  VANCOMYCIN, RANDOM  HEPARIN LEVEL (UNFRACTIONATED)  HEPARIN LEVEL (UNFRACTIONATED)  BLOOD GAS, ARTERIAL  CYTOLOGY - NON PAP  CYTOLOGY - NON PAP  CYTOLOGY - NON PAP  SURGICAL PATHOLOGY  CYTOLOGY - NON PAP  SURGICAL PATHOLOGY  CYTOLOGY - NON PAP    Imaging Review  IMPRESSION: 1. Bilateral airspace opacities are worsened, with underlying interstitial accentuation and vague nodularity of these opacities. Although multilobar bacterial pneumonia is a for possibility, other possibilities might include metastatic disease to the lungs, fungal disease, fulminant tuberculosis, or pulmonary hemorrhage. Correlate with the patient's symptoms. Chest CT to be utilized for further characterization if clinically warranted.     MDM   Final diagnoses:  Lung mass  Hypoxia        Leonard Schwartz, MD 09/10/15 [phone]

## 2015-08-29 NOTE — ED Notes (Signed)
Per pt, states sick since christmas-states diagnosed with PNA on Tuesday-on antibiotics-increased SOB this am

## 2015-08-29 NOTE — ED Notes (Signed)
RT called

## 2015-08-29 NOTE — H&P (Signed)
PCP:  No primary care provider on file.    Referring provider Dr. Audie Pinto   Chief Complaint:   Dyspnea  HPI: Monique [name] is a 40 y.o. female   has no past medical history on file.   Presented with about the month worsens worsening shortness of breath and wheezing. She has been seen for this by her primary care provider diagnosed with pneumonia based on chest x-ray and completed antibiotics is not improvement repeat chest x-ray showed persistent pneumonia she had a total three courses of antibiotics Including z-pack, levaquin and clarithromycin. She denies significant weight loss. Reports poor appetite. Denies night sweats no fevers no chills no hemoptysis She was given breathing treatments is set any improvement. Finally her primary care provider sent her in to emergency department. In ER CT scan of the chest was done showing large central right middle lobe lung mass with associated postobstructive atelectasis bulky mediastinal and bilateral hilar adenopathy multiple bilateral discrete pulmonary nodules obstructing right middle lobe bronchus lymphadenopathy causing narrowing of pulmonary arteries with adenopathy over upper abdomen near the gastroesophageal junction.  Hospitalist was called for admission for lung mass  Review of Systems:    Pertinent positives include:  shortness of breath at rest, dyspnea on exertion, productive cough,   Constitutional:  No weight loss, night sweats, Fevers, chills, fatigue, weight loss  HEENT:  No headaches, Difficulty swallowing,Tooth/dental problems,Sore throat,  No sneezing, itching, ear ache, nasal congestion, post nasal drip,  Cardio-vascular:  No chest pain, Orthopnea, PND, anasarca, dizziness, palpitations.no Bilateral lower extremity swelling  GI:  No heartburn, indigestion, abdominal pain, nausea, vomiting, diarrhea, change in bowel habits, loss of appetite, melena, blood in stool, hematemesis Resp:    No excess mucus, noNo  non-productive cough, No coughing up of blood.No change in color of mucus.No wheezing. Skin:  no rash or lesions. No jaundice GU:  no dysuria, change in color of urine, no urgency or frequency. No straining to urinate.  No flank pain.  Musculoskeletal:  No joint pain or no joint swelling. No decreased range of motion. No back pain.  Psych:  No change in mood or affect. No depression or anxiety. No memory loss.  Neuro: no localizing neurological complaints, no tingling, no weakness, no double vision, no gait abnormality, no slurred speech, no confusion  Otherwise ROS are negative except for above, 10 systems were reviewed  Past Medical History: History reviewed. No pertinent past medical history. History reviewed. No pertinent past surgical history.   Medications: Prior to Admission medications   Medication Sig Start Date End Date Taking? Authorizing Provider  albuterol (PROVENTIL HFA;VENTOLIN HFA) 108 (90 Base) MCG/ACT inhaler Inhale 2 puffs into the lungs every 4 (four) hours as needed for wheezing or shortness of breath.   Yes Historical Provider, MD  Ascorbic Acid (VITAMIN C PO) Take 1 tablet by mouth daily.   Yes Historical Provider, MD  budesonide-formoterol (SYMBICORT) 160-4.5 MCG/ACT inhaler Inhale 2 puffs into the lungs 2 (two) times daily.   Yes Historical Provider, MD  Cholecalciferol (VITAMIN D PO) Take 1 tablet by mouth daily.   Yes Historical Provider, MD  clarithromycin (BIAXIN) 500 MG tablet Take 500 mg by mouth 2 (two) times daily. For 10 days 08/26/15-09/05/15 08/26/15  Yes Historical Provider, MD  levofloxacin (LEVAQUIN) 500 MG tablet Take 500 mg by mouth daily. Reported on 09/09/2015 08/18/15   Historical Provider, MD    Allergies:  No Known Allergies  Social History:  Ambulatory independently   Lives at home  With family     reports that she has never smoked. She does not have any smokeless tobacco history on file. She reports that she drinks alcohol. She reports  that she does not use illicit drugs.     Family History: family history includes CAD in her mother; Lymphoma in her mother.    Physical Exam: Patient Vitals for the past 24 hrs:  BP Temp Temp src Pulse Resp SpO2  09/03/2015 2035 135/87 mmHg - - (!) 128 22 93 %  08/25/2015 1930 134/87 mmHg - - (!) 129 - 91 %  09/16/2015 1910 135/94 mmHg - - (!) 136 16 (!) 89 %  09/16/2015 1642 144/78 mmHg - - (!) 138 20 93 %  09/10/2015 1640 - - - - - 91 %  09/04/2015 1638 144/78 mmHg - - (!) 137 - 90 %  08/23/2015 1407 - - - - - (!) 88 %  08/17/2015 1406 (!) 161/110 mmHg 98.1 F (36.7 C) Oral (!) 136 20 96 %    1. General:  in No Acute distress but with increase work of breathing 2. Psychological: Alert and Oriented 3. Head/ENT:   Moist  Mucous Membranes                          Head Non traumatic, neck supple                          Normal   Dentition 4. SKIN:   decreased Skin turgor,  Skin clean Dry and intact no rash 5. Heart: Regular rate and rhythm no Murmur, Rub or gallop 6. Lungs:some wheezes mild crackles   7. Abdomen: Soft, non-tender, Non distended 8. Lower extremities: no clubbing, cyanosis, or edema 9. Neurologically Grossly intact, moving all 4 extremities equally 10. MSK: Normal range of motion  body mass index is unknown because there is no height or weight on file.   Labs on Admission:   Results for orders placed or performed during the hospital encounter of 08/30/2015 (from the past 24 hour(s))  Culture, blood (Routine X 2) w Reflex to ID Panel     Status: None (Preliminary result)   Collection Time: 09/15/2015  5:40 PM  Result Value Ref Range   Specimen Description      BLOOD LEFT ANTECUBITAL Performed at [place]    Culture PENDING    Report Status PENDING   Culture, blood (Routine X 2) w Reflex to ID Panel     Status: None (Preliminary result)   Collection Time: 08/20/2015  5:45 PM  Result Value Ref Range   Specimen  Description      BLOOD LEFT ANTECUBITAL Performed at [place]    Culture PENDING    Report Status PENDING   CBC with Differential/Platelet     Status: Abnormal   Collection Time: 09/02/2015  5:50 PM  Result Value Ref Range   WBC 16.9 (H) 4.0 - 10.5 K/uL   RBC 4.75 3.87 - 5.11 MIL/uL   Hemoglobin 13.0 12.0 - 15.0 g/dL   HCT 39.3 36.0 - 46.0 %   MCV 82.7 78.0 - 100.0 fL   MCH 27.4 26.0 - 34.0 pg   MCHC 33.1 30.0 - 36.0 g/dL   RDW 13.4 11.5 - 15.5 %   Platelets 461 (H) 150 -  400 K/uL   Neutrophils Relative % 84 %   Neutro Abs 14.1 (H) 1.7 - 7.7 K/uL   Lymphocytes Relative 5 %   Lymphs Abs 0.9 0.7 - 4.0 K/uL   Monocytes Relative 11 %   Monocytes Absolute 1.8 (H) 0.1 - 1.0 K/uL   Eosinophils Relative 0 %   Eosinophils Absolute 0.0 0.0 - 0.7 K/uL   Basophils Relative 0 %   Basophils Absolute 0.0 0.0 - 0.1 K/uL  Comprehensive metabolic panel     Status: Abnormal   Collection Time: 08/30/2015  5:50 PM  Result Value Ref Range   Sodium 136 135 - 145 mmol/L   Potassium 4.1 3.5 - 5.1 mmol/L   Chloride 100 (L) 101 - 111 mmol/L   CO2 22 22 - 32 mmol/L   Glucose, Bld 119 (H) 65 - 99 mg/dL   BUN 15 6 - 20 mg/dL   Creatinine, Ser 0.80 0.44 - 1.00 mg/dL   Calcium 9.7 8.9 - 10.3 mg/dL   Total Protein 7.4 6.5 - 8.1 g/dL   Albumin 3.6 3.5 - 5.0 g/dL   AST 68 (H) 15 - 41 U/L   ALT 16 14 - 54 U/L   Alkaline Phosphatase 70 38 - 126 U/L   Total Bilirubin 1.2 0.3 - 1.2 mg/dL   GFR calc non Af Amer >60 >60 mL/min   GFR calc Af Amer >60 >60 mL/min   Anion gap 14 5 - 15  I-Stat CG4 Lactic Acid, ED     Status: Abnormal   Collection Time: 09/01/2015  6:00 PM  Result Value Ref Range   Lactic Acid, Venous 2.94 (HH) 0.5 - 2.0 mmol/L   Comment NOTIFIED PHYSICIAN   I-Stat CG4 Lactic Acid, ED     Status: Abnormal   Collection Time: 09/15/2015  8:48 PM  Result Value Ref Range   Lactic Acid, Venous 2.17 (HH) 0.5 - 2.0 mmol/L   Comment  NOTIFIED PHYSICIAN     UA not obtained  No results found for: HGBA1C  CrCl cannot be calculated (Unknown ideal weight.).  BNP (last 3 results) No results for input(s): PROBNP in the last 8760 hours.  Other results:  I have pearsonaly reviewed this: ECG REPORT  Not obtained   There were no vitals filed for this visit.   Cultures:    Component Value Date/Time   SDES  08/26/2015 1745    BLOOD LEFT ANTECUBITAL Performed at [place] 09/16/2015 1745   CULT PENDING 09/15/2015 1745   REPTSTATUS PENDING 08/17/2015 1745     Radiological Exams on Admission: Dg Chest 2 View  09/09/2015  CLINICAL DATA:  Shortness of breath. Pneumonia over the past 4 days. EXAM: CHEST  2 VIEW COMPARISON:  08/26/2015 FINDINGS: Worsening patchy bilateral airspace opacities most confluent in the right middle lobe. The these have a nodular component if and underlying malignancy, fungal disease, or fulminant tuberculosis cannot be excluded particularly if the patient has immunosuppression. Right lower paratracheal density may be incidental but could be from adenopathy. There is fullness of the left hilum. IMPRESSION: 1. Bilateral airspace opacities are worsened, with underlying interstitial accentuation and vague nodularity of these opacities. Although multilobar bacterial pneumonia is a for possibility, other possibilities might include metastatic disease to the lungs, fungal disease, fulminant tuberculosis, or pulmonary hemorrhage. Correlate with the patient's symptoms. Chest CT to be utilized for further characterization if clinically warranted. Electronically Signed   By: Van Clines  M.D.   On: 09/02/2015 16:59   Ct Angio Chest Pe W/cm &/or Wo Cm  09/13/2015  CLINICAL DATA:  Patient states ill since Christmas, diagnosed with pneumonia 4 days ago on antibiotics with increase shortness of breath this morning. EXAM: CT ANGIOGRAPHY CHEST  WITH CONTRAST TECHNIQUE: Multidetector CT imaging of the chest was performed using the standard protocol during bolus administration of intravenous contrast. Multiplanar CT image reconstructions and MIPs were obtained to evaluate the vascular anatomy. CONTRAST:  158m OMNIPAQUE IOHEXOL 350 MG/ML SOLN COMPARISON:  Chest x-ray 08/28/2015 and 08/26/2015 FINDINGS: Lungs are adequately inflated demonstrate multiple bilateral discrete nodules and nodular regions of airspace opacification. There is masslike consolidation over the right midlung continuous with soft tissue density over the right hilum and subcarinal region. There is no pleural effusion. There is extensive mediastinal and bilateral hilar adenopathy. Subcarinal adenopathy measures 3.2 cm by short axis. A large right peritracheal node measures 2.7 cm by short axis. Adenopathy extends into the superior mediastinum and neck base at the level of the thyroid gland bilateral. Findings are concerning for central right lung neoplasm with metastatic disease. Adenopathy causes narrowing of the distal aspect of the main pulmonary arteries bilaterally. There is associated airway obstruction over the central right middle lobe. Heart is normal in size. No definite evidence of pulmonary embolism. No axillary adenopathy. Images through the upper abdomen demonstrate a few small right pericardial phrenic lymph nodes. Adenopathy adjacent the gastroesophageal junction with lymph node measuring 1.8 cm by short axis. There are degenerative changes of the spine. Review of the MIP images confirms the above findings. IMPRESSION: Large central right middle lobe lung mass without clear borders likely with mild associated postobstructive atelectasis. Bulky mediastinal and bilateral hilar adenopathy as described. Multiple bilateral discrete pulmonary nodules and nodular areas of airspace opacification likely metastatic disease. Right lung mass obstructs the right middle lobe bronchus.  Adenopathy causes narrowing of the main pulmonary arteries. Adenopathy over the upper abdomen near the gastroesophageal junction likely metastatic disease. Recommend tissue diagnosis. No evidence of pulmonary embolism. Electronically Signed   By: DMarin OlpM.D.   On: 08/17/2015 20:38    Chart has been reviewed  Family at  Bedside  plan of care was discussed with  Husband CAllyce [phone] Assessment/Plan 41year old female and in no significant prior history presents with one-month history of dyspnea was initially treated for presumed pneumonia but did not improve despite antibiotics CT scan today showed evidence of large lung mass  Present on Admission:  . Lung mass will need tissue diagnosis of note history of lymphoma in mother O discussed with pulmonology for possible bronchoscopy versus IR referral  . Hypoxia continue on oxygen patient requiring 4 L to maintain oxygen saturation more monitor and step down  . Acute respiratory failure ([place] with hypoxia will admit to step down patient is requiring oxygen significant shortness of breath unable to lay flat      Prophylaxis: scd  CODE STATUS:  FULL CODE    Disposition:  To home once workup is complete and patient is stable   Other plan as per orders.  I have spent a total of 65 min on this admission discussed with pulumonary for consult for possible bronchoscopy for biopsy  Yarden Hillis 08/30/2015, 12:27 AM    [facility]  Pager [phone]  after 2 AM please page floor coverage PA If 7AM-7PM, please contact the day team taking care of the patient  [email or web address]  Password [email or web address]

## 2015-08-29 NOTE — ED Notes (Signed)
Lactic acid result given to Dr Audie Pinto

## 2015-08-30 ENCOUNTER — Encounter (HOSPITAL_COMMUNITY): Payer: Self-pay | Admitting: Radiology

## 2015-08-30 ENCOUNTER — Observation Stay (HOSPITAL_COMMUNITY): Payer: BC Managed Care – PPO

## 2015-08-30 DIAGNOSIS — I8000 Phlebitis and thrombophlebitis of superficial vessels of unspecified lower extremity: Secondary | ICD-10-CM | POA: Diagnosis not present

## 2015-08-30 DIAGNOSIS — K567 Ileus, unspecified: Secondary | ICD-10-CM | POA: Diagnosis not present

## 2015-08-30 DIAGNOSIS — D696 Thrombocytopenia, unspecified: Secondary | ICD-10-CM | POA: Diagnosis not present

## 2015-08-30 DIAGNOSIS — J189 Pneumonia, unspecified organism: Secondary | ICD-10-CM | POA: Diagnosis present

## 2015-08-30 DIAGNOSIS — C3431 Malignant neoplasm of lower lobe, right bronchus or lung: Secondary | ICD-10-CM | POA: Diagnosis not present

## 2015-08-30 DIAGNOSIS — C779 Secondary and unspecified malignant neoplasm of lymph node, unspecified: Secondary | ICD-10-CM | POA: Diagnosis present

## 2015-08-30 DIAGNOSIS — Y929 Unspecified place or not applicable: Secondary | ICD-10-CM | POA: Diagnosis not present

## 2015-08-30 DIAGNOSIS — I749 Embolism and thrombosis of unspecified artery: Secondary | ICD-10-CM | POA: Diagnosis not present

## 2015-08-30 DIAGNOSIS — G9340 Encephalopathy, unspecified: Secondary | ICD-10-CM | POA: Diagnosis not present

## 2015-08-30 DIAGNOSIS — Y939 Activity, unspecified: Secondary | ICD-10-CM | POA: Diagnosis not present

## 2015-08-30 DIAGNOSIS — J9811 Atelectasis: Secondary | ICD-10-CM | POA: Diagnosis present

## 2015-08-30 DIAGNOSIS — N179 Acute kidney failure, unspecified: Secondary | ICD-10-CM | POA: Diagnosis not present

## 2015-08-30 DIAGNOSIS — G709 Myoneural disorder, unspecified: Secondary | ICD-10-CM | POA: Diagnosis not present

## 2015-08-30 DIAGNOSIS — R509 Fever, unspecified: Secondary | ICD-10-CM | POA: Diagnosis not present

## 2015-08-30 DIAGNOSIS — C569 Malignant neoplasm of unspecified ovary: Secondary | ICD-10-CM | POA: Diagnosis not present

## 2015-08-30 DIAGNOSIS — R29898 Other symptoms and signs involving the musculoskeletal system: Secondary | ICD-10-CM | POA: Diagnosis not present

## 2015-08-30 DIAGNOSIS — I82409 Acute embolism and thrombosis of unspecified deep veins of unspecified lower extremity: Secondary | ICD-10-CM | POA: Diagnosis not present

## 2015-08-30 DIAGNOSIS — E874 Mixed disorder of acid-base balance: Secondary | ICD-10-CM | POA: Diagnosis not present

## 2015-08-30 DIAGNOSIS — C801 Malignant (primary) neoplasm, unspecified: Secondary | ICD-10-CM | POA: Diagnosis not present

## 2015-08-30 DIAGNOSIS — A419 Sepsis, unspecified organism: Secondary | ICD-10-CM | POA: Diagnosis present

## 2015-08-30 DIAGNOSIS — R062 Wheezing: Secondary | ICD-10-CM | POA: Diagnosis not present

## 2015-08-30 DIAGNOSIS — D72829 Elevated white blood cell count, unspecified: Secondary | ICD-10-CM | POA: Diagnosis present

## 2015-08-30 DIAGNOSIS — E878 Other disorders of electrolyte and fluid balance, not elsewhere classified: Secondary | ICD-10-CM | POA: Diagnosis not present

## 2015-08-30 DIAGNOSIS — M79606 Pain in leg, unspecified: Secondary | ICD-10-CM | POA: Diagnosis not present

## 2015-08-30 DIAGNOSIS — R531 Weakness: Secondary | ICD-10-CM | POA: Diagnosis not present

## 2015-08-30 DIAGNOSIS — R1900 Intra-abdominal and pelvic swelling, mass and lump, unspecified site: Secondary | ICD-10-CM | POA: Diagnosis present

## 2015-08-30 DIAGNOSIS — R590 Localized enlarged lymph nodes: Secondary | ICD-10-CM | POA: Diagnosis present

## 2015-08-30 DIAGNOSIS — Z5111 Encounter for antineoplastic chemotherapy: Secondary | ICD-10-CM | POA: Diagnosis not present

## 2015-08-30 DIAGNOSIS — Z87891 Personal history of nicotine dependence: Secondary | ICD-10-CM | POA: Diagnosis not present

## 2015-08-30 DIAGNOSIS — J9601 Acute respiratory failure with hypoxia: Secondary | ICD-10-CM | POA: Diagnosis present

## 2015-08-30 DIAGNOSIS — N9489 Other specified conditions associated with female genital organs and menstrual cycle: Secondary | ICD-10-CM | POA: Diagnosis not present

## 2015-08-30 DIAGNOSIS — R591 Generalized enlarged lymph nodes: Secondary | ICD-10-CM | POA: Diagnosis not present

## 2015-08-30 DIAGNOSIS — J9690 Respiratory failure, unspecified, unspecified whether with hypoxia or hypercapnia: Secondary | ICD-10-CM | POA: Diagnosis not present

## 2015-08-30 DIAGNOSIS — R401 Stupor: Secondary | ICD-10-CM | POA: Diagnosis not present

## 2015-08-30 DIAGNOSIS — T17990A Other foreign object in respiratory tract, part unspecified in causing asphyxiation, initial encounter: Secondary | ICD-10-CM | POA: Diagnosis not present

## 2015-08-30 DIAGNOSIS — R599 Enlarged lymph nodes, unspecified: Secondary | ICD-10-CM | POA: Diagnosis not present

## 2015-08-30 DIAGNOSIS — D6489 Other specified anemias: Secondary | ICD-10-CM | POA: Diagnosis present

## 2015-08-30 DIAGNOSIS — R0902 Hypoxemia: Secondary | ICD-10-CM | POA: Diagnosis not present

## 2015-08-30 DIAGNOSIS — X58XXXA Exposure to other specified factors, initial encounter: Secondary | ICD-10-CM | POA: Diagnosis present

## 2015-08-30 DIAGNOSIS — C7B8 Other secondary neuroendocrine tumors: Secondary | ICD-10-CM | POA: Diagnosis not present

## 2015-08-30 DIAGNOSIS — E870 Hyperosmolality and hypernatremia: Secondary | ICD-10-CM | POA: Diagnosis not present

## 2015-08-30 DIAGNOSIS — C7800 Secondary malignant neoplasm of unspecified lung: Secondary | ICD-10-CM | POA: Diagnosis not present

## 2015-08-30 DIAGNOSIS — D3910 Neoplasm of uncertain behavior of unspecified ovary: Secondary | ICD-10-CM | POA: Diagnosis not present

## 2015-08-30 DIAGNOSIS — C7A8 Other malignant neuroendocrine tumors: Secondary | ICD-10-CM | POA: Diagnosis not present

## 2015-08-30 DIAGNOSIS — C562 Malignant neoplasm of left ovary: Secondary | ICD-10-CM | POA: Diagnosis not present

## 2015-08-30 DIAGNOSIS — R918 Other nonspecific abnormal finding of lung field: Secondary | ICD-10-CM | POA: Diagnosis present

## 2015-08-30 DIAGNOSIS — Z515 Encounter for palliative care: Secondary | ICD-10-CM | POA: Diagnosis not present

## 2015-08-30 DIAGNOSIS — N19 Unspecified kidney failure: Secondary | ICD-10-CM | POA: Diagnosis not present

## 2015-08-30 DIAGNOSIS — E876 Hypokalemia: Secondary | ICD-10-CM | POA: Diagnosis present

## 2015-08-30 DIAGNOSIS — E781 Pure hyperglyceridemia: Secondary | ICD-10-CM | POA: Diagnosis present

## 2015-08-30 DIAGNOSIS — R4020 Unspecified coma: Secondary | ICD-10-CM | POA: Diagnosis not present

## 2015-08-30 DIAGNOSIS — J9600 Acute respiratory failure, unspecified whether with hypoxia or hypercapnia: Secondary | ICD-10-CM | POA: Diagnosis not present

## 2015-08-30 DIAGNOSIS — C3490 Malignant neoplasm of unspecified part of unspecified bronchus or lung: Secondary | ICD-10-CM | POA: Diagnosis not present

## 2015-08-30 LAB — COMPREHENSIVE METABOLIC PANEL
ALBUMIN: 3.3 g/dL — AB (ref 3.5–5.0)
ALT: 15 U/L (ref 14–54)
AST: 63 U/L — AB (ref 15–41)
Alkaline Phosphatase: 74 U/L (ref 38–126)
Anion gap: 13 (ref 5–15)
BUN: 15 mg/dL (ref 6–20)
CHLORIDE: 97 mmol/L — AB (ref 101–111)
CO2: 22 mmol/L (ref 22–32)
Calcium: 8.9 mg/dL (ref 8.9–10.3)
Creatinine, Ser: 0.7 mg/dL (ref 0.44–1.00)
GFR calc Af Amer: >60 mL/min (ref >60)
Glucose, Bld: 93 mg/dL (ref 65–99)
POTASSIUM: 4.2 mmol/L (ref 3.5–5.1)
SODIUM: 132 mmol/L — AB (ref 135–145)
Total Bilirubin: 1.4 mg/dL — ABNORMAL HIGH (ref 0.3–1.2)
Total Protein: 6.7 g/dL (ref 6.5–8.1)

## 2015-08-30 LAB — CBC
HEMATOCRIT: 37.8 % (ref 36.0–46.0)
Hemoglobin: 12.3 g/dL (ref 12.0–15.0)
MCH: 26.9 pg (ref 26.0–34.0)
MCHC: 32.5 g/dL (ref 30.0–36.0)
MCV: 82.7 fL (ref 78.0–100.0)
Platelets: 387 10*3/uL (ref 150–400)
RBC: 4.57 MIL/uL (ref 3.87–5.11)
RDW: 13.5 % (ref 11.5–15.5)
WBC: 17.4 10*3/uL — AB (ref 4.0–10.5)

## 2015-08-30 LAB — TSH: TSH: 0.55 u[IU]/mL (ref 0.350–4.500)

## 2015-08-30 LAB — STREP PNEUMONIAE URINARY ANTIGEN: STREP PNEUMO URINARY ANTIGEN: NEGATIVE

## 2015-08-30 LAB — MRSA PCR SCREENING: MRSA by PCR: NEGATIVE

## 2015-08-30 LAB — PHOSPHORUS: PHOSPHORUS: 2.7 mg/dL (ref 2.5–4.6)

## 2015-08-30 LAB — PROTIME-INR
INR: 1.18 (ref 0.00–1.49)
Prothrombin Time: 15.2 seconds (ref 11.6–15.2)

## 2015-08-30 LAB — MAGNESIUM: MAGNESIUM: 2.2 mg/dL (ref 1.7–2.4)

## 2015-08-30 MED ORDER — SODIUM CHLORIDE 0.9 % IV BOLUS (SEPSIS)
1000.0000 mL | Freq: Once | INTRAVENOUS | Status: AC
  Administered 2015-08-30: 1000 mL via INTRAVENOUS

## 2015-08-30 MED ORDER — VANCOMYCIN HCL IN DEXTROSE 1-5 GM/200ML-% IV SOLN
1000.0000 mg | Freq: Three times a day (TID) | INTRAVENOUS | Status: DC
  Administered 2015-08-30 – 2015-09-04 (×15): 1000 mg via INTRAVENOUS
  Filled 2015-08-30 (×13): qty 200

## 2015-08-30 MED ORDER — CETYLPYRIDINIUM CHLORIDE 0.05 % MT LIQD
7.0000 mL | Freq: Two times a day (BID) | OROMUCOSAL | Status: DC
  Administered 2015-08-30 – 2015-09-16 (×25): 7 mL via OROMUCOSAL

## 2015-08-30 MED ORDER — SODIUM CHLORIDE 0.9 % IJ SOLN
3.0000 mL | Freq: Two times a day (BID) | INTRAMUSCULAR | Status: DC
  Administered 2015-08-30 – 2015-09-19 (×21): 3 mL via INTRAVENOUS

## 2015-08-30 MED ORDER — ONDANSETRON HCL 4 MG/2ML IJ SOLN: 4.0000 mg | Freq: Four times a day (QID) | INTRAMUSCULAR | Status: DC | PRN

## 2015-08-30 MED ORDER — IOHEXOL 300 MG/ML  SOLN
25.0000 mL | Freq: Once | INTRAMUSCULAR | Status: AC | PRN
  Administered 2015-08-30: 25 mL via ORAL

## 2015-08-30 MED ORDER — LEVALBUTEROL HCL 0.63 MG/3ML IN NEBU: 0.6300 mg | INHALATION_SOLUTION | RESPIRATORY_TRACT | Status: DC | PRN

## 2015-08-30 MED ORDER — ONDANSETRON HCL 4 MG PO TABS: 4.0000 mg | ORAL_TABLET | Freq: Four times a day (QID) | ORAL | Status: DC | PRN

## 2015-08-30 MED ORDER — BUDESONIDE-FORMOTEROL FUMARATE 160-4.5 MCG/ACT IN AERO
2.0000 | INHALATION_SPRAY | Freq: Two times a day (BID) | RESPIRATORY_TRACT | Status: DC
  Administered 2015-08-30 – 2015-09-01 (×4): 2 via RESPIRATORY_TRACT
  Filled 2015-08-30: qty 6

## 2015-08-30 MED ORDER — IPRATROPIUM-ALBUTEROL 0.5-2.5 (3) MG/3ML IN SOLN
3.0000 mL | RESPIRATORY_TRACT | Status: DC
  Administered 2015-08-30 – 2015-08-31 (×3): 3 mL via RESPIRATORY_TRACT
  Filled 2015-08-30 (×3): qty 3

## 2015-08-30 MED ORDER — SODIUM CHLORIDE 0.9 % IV SOLN
INTRAVENOUS | Status: DC
  Administered 2015-09-04: 17:00:00 via INTRAVENOUS

## 2015-08-30 MED ORDER — ACETAMINOPHEN 325 MG PO TABS
650.0000 mg | ORAL_TABLET | Freq: Four times a day (QID) | ORAL | Status: DC | PRN
  Administered 2015-09-05 – 2015-09-07 (×4): 650 mg via ORAL
  Filled 2015-08-30 (×3): qty 2

## 2015-08-30 MED ORDER — HYDRALAZINE HCL 10 MG PO TABS
10.0000 mg | ORAL_TABLET | Freq: Once | ORAL | Status: AC
  Administered 2015-08-30: 10 mg via ORAL
  Filled 2015-08-30: qty 1

## 2015-08-30 MED ORDER — IPRATROPIUM-ALBUTEROL 0.5-2.5 (3) MG/3ML IN SOLN: 3.0000 mL | RESPIRATORY_TRACT | Status: DC | PRN

## 2015-08-30 MED ORDER — HYDROCODONE-ACETAMINOPHEN 5-325 MG PO TABS: 1.0000 | ORAL_TABLET | ORAL | Status: DC | PRN

## 2015-08-30 MED ORDER — HYDRALAZINE HCL 20 MG/ML IJ SOLN
10.0000 mg | Freq: Four times a day (QID) | INTRAMUSCULAR | Status: DC | PRN
  Administered 2015-08-30 – 2015-09-03 (×3): 10 mg via INTRAVENOUS
  Filled 2015-08-30 (×4): qty 1

## 2015-08-30 MED ORDER — PIPERACILLIN-TAZOBACTAM 3.375 G IVPB
3.3750 g | Freq: Three times a day (TID) | INTRAVENOUS | Status: AC
  Administered 2015-08-30 – 2015-09-05 (×20): 3.375 g via INTRAVENOUS
  Filled 2015-08-30 (×19): qty 50

## 2015-08-30 MED ORDER — ACETAMINOPHEN 650 MG RE SUPP: 650.0000 mg | Freq: Four times a day (QID) | RECTAL | Status: DC | PRN

## 2015-08-30 MED ORDER — GUAIFENESIN ER 600 MG PO TB12
600.0000 mg | ORAL_TABLET | Freq: Two times a day (BID) | ORAL | Status: DC
  Administered 2015-08-30 – 2015-09-02 (×6): 600 mg via ORAL
  Filled 2015-08-30 (×7): qty 1

## 2015-08-30 MED ORDER — IOHEXOL 300 MG/ML  SOLN
100.0000 mL | Freq: Once | INTRAMUSCULAR | Status: AC | PRN
  Administered 2015-08-30: 100 mL via INTRAVENOUS

## 2015-08-30 NOTE — ED Notes (Signed)
Called charge nurse, take patient up around 1:30pm

## 2015-08-30 NOTE — Progress Notes (Signed)
ANTIBIOTIC CONSULT NOTE - INITIAL  Pharmacy Consult for vancomycin/Zosyn Indication: HCAP/post obstructive PNA  No Known Allergies  Patient Measurements: Height: 5' 5.5" (166.4 cm) Weight: 182 lb (82.555 kg) IBW/kg (Calculated) : 58.15 Adjusted Body Weight:   Vital Signs: Temp: 97.6 F (36.4 C) (01/14 0618) Temp Source: Oral (01/14 0618) BP: 143/96 mmHg (01/14 0618) Pulse Rate: 106 (01/14 0618) Intake/Output from previous day:   Intake/Output from this shift:    Labs:  Recent Labs  08/30/2015 1750  WBC 16.9*  HGB 13.0  PLT 461*  CREATININE 0.80   Estimated Creatinine Clearance: 100.3 mL/min (by C-G formula based on Cr of 0.8). No results for input(s): VANCOTROUGH, VANCOPEAK, VANCORANDOM, GENTTROUGH, GENTPEAK, GENTRANDOM, TOBRATROUGH, TOBRAPEAK, TOBRARND, AMIKACINPEAK, AMIKACINTROU, AMIKACIN in the last 72 hours.   Microbiology: Recent Results (from the past 720 hour(s))  Culture, blood (Routine X 2) w Reflex to ID Panel     Status: None (Preliminary result)   Collection Time: 09/05/2015  5:40 PM  Result Value Ref Range Status   Specimen Description   Final    BLOOD LEFT ANTECUBITAL Performed at [facility]  Final   Culture PENDING  Incomplete   Report Status PENDING  Incomplete  Culture, blood (Routine X 2) w Reflex to ID Panel     Status: None (Preliminary result)   Collection Time: 08/22/2015  5:45 PM  Result Value Ref Range Status   Specimen Description   Final    BLOOD LEFT ANTECUBITAL Performed at [facility]  Final   Culture PENDING  Incomplete   Report Status PENDING  Incomplete    Medical History: History reviewed. No pertinent past medical history.  Medications:  Scheduled:  . budesonide-formoterol  2 puff Inhalation BID   Assessment: Pt is a 40 yo female recently diagnosed with HCAP/post obstructive PNA. Pt had no PMHx  but developed dyspnea, wheezing, for the past 3-5 weeks. CT revealed lung mass. Pt had recent treatment with levofloxacin, azithromycin and clarithromycin recently. Pharmacy is consulted to dose vancomycin and Zosyn.   Levofloxacin, azithromycin, clarithromycin PTA in last month   1/14 >> vancomycin >> 1/14  >> Zosyn >>    1/14/ blood x 2   Goal of Therapy:  Vancomycin trough level 15-20 mcg/ml  Monitor clinical course  Plan:  Follow up culture results  Vancomycin 1000 mg IV q8h Zosyn 3.375 gr IV q8h EI  Royetta Asal, PharmD, BCPS Pager [phone] 08/30/2015 10:47 AM

## 2015-08-30 NOTE — ED Notes (Signed)
Pt denies pain and continues to rest comfortably with spouse at the bedside.  O2 saturation WNL on 2L nasal cannula.  RN provided ice and repositioned pt per request. No additional needs and no acute distress noted at this time.

## 2015-08-30 NOTE — Progress Notes (Addendum)
Patient ID: Monique [name], female   DOB: March 04, 1976, 40 y.o.   MRN: [id number] [facility] PROGRESS NOTE  Monique [name] AOZ:[id number] DOB: July 26, 1976 DOA: 08/31/2015 PCP: No primary care provider on file.  Brief narrative:    40 year old female with no significant past medical history , has family history of lymphoma in her mother (succesfully treated), patient husband smokes but not in household and pt does not smoke. She first presented to PCP 12/19 because of shortness of breath and was given zpack. She got better initially but then started to get short of breath. She went to PCP and was given albuterol inhaler, got better for a short time and then again shortness of breath started. She went back to PCP and was given Levaquin and steroids which helped for few days but then she again got short of breath and came to ED for further evaluation.   In ED, CXR and CT with findings concerning for malignancy although sarcoidosis or COP remain in the differential. Plan is for bronchoscopy per pulmonary schedule.    Assessment/Plan:    Active Problems: Acute respiratory failure with hypoxia / Postobstructive pneumonia / Lung malignancy / Leukocytosis  - Ultimately she will need tissue biopsy, i think per pulmonary this will be done via bronchoscope - Appreciate pulmonary assistance - Start vanco and zosyn - Added duoneb every 4 hours PRN and scheduled - Oxygen support via [place] to keep O2 sats above 90%  Sepsis due to pneumonia - Sepsis criteria met on admission with tachycardia, tachypnea, hypoxia, leukocytosis, lactic acidosis - She is on vanco and zosyn - Blood cultures so far negative  - HIV test pending - Resp culture is pending, strep pneumo and legionella are pending    DVT Prophylaxis  - SCD's bilaterally    Code Status: Full.  Family Communication:  plan of care discussed with the patient Disposition Plan: Home once resp status better, probably by 09/02/2015  IV  access:  Peripheral IV  Procedures and diagnostic studies:    Dg Chest 2 View 09/11/2015   1. Bilateral airspace opacities are worsened, with underlying interstitial accentuation and vague nodularity of these opacities. Although multilobar bacterial pneumonia is a for possibility, other possibilities might include metastatic disease to the lungs, fungal disease, fulminant tuberculosis, or pulmonary hemorrhage. Correlate with the patient's symptoms. Chest CT to be utilized for further characterization if clinically warranted. Electronically Signed   By: Van Clines M.D.   On: 09/14/2015 16:59   Dg Chest 2 View 08/26/2015  Multifocal alveolar opacities bilaterally most compatible with pneumonia. The right middle lobe appears the most significantly involved. These results will be called to the ordering clinician or representative by the Radiologist Assistant, and communication documented in the PACS or zVision Dashboard. Electronically Signed   By: David  Martinique M.D.   On: 08/26/2015 12:31   Ct Angio Chest Pe W/cm &/or Wo Cm 09/10/2015  Large central right middle lobe lung mass without clear borders likely with mild associated postobstructive atelectasis. Bulky mediastinal and bilateral hilar adenopathy as described. Multiple bilateral discrete pulmonary nodules and nodular areas of airspace opacification likely metastatic disease. Right lung mass obstructs the right middle lobe bronchus. Adenopathy causes narrowing of the main pulmonary arteries. Adenopathy over the upper abdomen near the gastroesophageal junction likely metastatic disease. Recommend tissue diagnosis. No evidence of pulmonary embolism. Electronically Signed   By: Marin Olp M.D.   On: 09/13/2015 20:38    Medical Consultants:  Pulmonary   Other  Consultants:  None   IAnti-Infectives:   Azithromycin and rocephin 1/13 Vanco and zosyn 08/30/2015 -->    Leisa Lenz, MD  [facility] Pager [phone]  Time spent in  minutes: 25 minutes  If 7PM-7AM, please contact night-coverage [email or web address] Password [facility] 08/30/2015, 8:28 AM      HPI/Subjective: No acute overnight events. Patient reports she feels better with oxygen.  Objective: Filed Vitals:   08/30/15 0200 08/30/15 0230 08/30/15 0241 08/30/15 0618  BP: 149/128 134/62 134/62 143/96  Pulse: 111 116 109 106  Temp:    97.6 F (36.4 C)  TempSrc:    Oral  Resp:   16 19  SpO2: 90% 89% 89% 90%   No intake or output data in the 24 hours ending 08/30/15 0828  Exam:   General:  Pt is alert, follows commands appropriately, not in acute distress  Cardiovascular: Regular rate and rhythm, S1/S2, no murmurs  Respiratory: diminished on right side but no wheezing, air entry better on left side   Abdomen: Soft, non tender, non distended, bowel sounds present  Extremities: No edema, pulses DP and PT palpable bilaterally  Neuro: Grossly nonfocal  Data Reviewed: Basic Metabolic Panel:  Recent Labs Lab 08/25/2015 1750  NA 136  K 4.1  CL 100*  CO2 22  GLUCOSE 119*  BUN 15  CREATININE 0.80  CALCIUM 9.7   Liver Function Tests:  Recent Labs Lab 09/14/2015 1750  AST 68*  ALT 16  ALKPHOS 70  BILITOT 1.2  PROT 7.4  ALBUMIN 3.6   No results for input(s): LIPASE, AMYLASE in the last 168 hours. No results for input(s): AMMONIA in the last 168 hours. CBC:  Recent Labs Lab 08/20/2015 1750  WBC 16.9*  NEUTROABS 14.1*  HGB 13.0  HCT 39.3  MCV 82.7  PLT 461*   Cardiac Enzymes: No results for input(s): CKTOTAL, CKMB, CKMBINDEX, TROPONINI in the last 168 hours. BNP: Invalid input(s): POCBNP CBG: No results for input(s): GLUCAP in the last 168 hours.  Recent Results (from the past 240 hour(s))  Culture, blood (Routine X 2) w Reflex to ID Panel     Status: None (Preliminary result)   Collection Time: 09/05/2015  5:40 PM  Result Value Ref Range Status   Specimen Description   Final    BLOOD LEFT ANTECUBITAL Performed at [place] [facility]  Final   Culture PENDING  Incomplete   Report Status PENDING  Incomplete  Culture, blood (Routine X 2) w Reflex to ID Panel     Status: None (Preliminary result)   Collection Time: 08/17/2015  5:45 PM  Result Value Ref Range Status   Specimen Description   Final    BLOOD LEFT ANTECUBITAL Performed at [facility]  Final   Culture PENDING  Incomplete   Report Status PENDING  Incomplete     Scheduled Meds:  Continuous Infusions: . sodium chloride 125 mL/hr at 09/12/2015 1747

## 2015-08-30 NOTE — ED Notes (Signed)
Pt ambulated to BR and back to room w/o difficulty accompanied by significant other

## 2015-08-30 NOTE — Consult Note (Addendum)
PULMONARY MEDICINE CONSULTATION   Name: Monique [name] MRN: [id number] DOB: [birth year]    ADMISSION DATE:  09/04/2015 CONSULTATION DATE: August 30, 2015  REQUESTING CLINICIAN: Alison Murray, MD PRIMARY SERVICE: [facility]  CHIEF COMPLAINT:  Dyspnea   HISTORY OF PRESENT ILLNESS:   Monique [name] is a 40 y/o woman with no PMHx who has had dyspnea, wheezing, for the past 3-5 weeks. She had seen her PCP and treated for possible pneumonia with azithromycin, etc. without improvement. She ultimately presented to the ED where a CT was concerning for malignancy. Pulm was consulted for possible intervention.  PAST MEDICAL HISTORY :  History reviewed. No pertinent past medical history. History reviewed. No pertinent past surgical history. Prior to Admission medications   Medication Sig Start Date End Date Taking? Authorizing Provider  albuterol (PROVENTIL HFA;VENTOLIN HFA) 108 (90 Base) MCG/ACT inhaler Inhale 2 puffs into the lungs every 4 (four) hours as needed for wheezing or shortness of breath.   Yes Historical Provider, MD  Ascorbic Acid (VITAMIN C PO) Take 1 tablet by mouth daily.   Yes Historical Provider, MD  budesonide-formoterol (SYMBICORT) 160-4.5 MCG/ACT inhaler Inhale 2 puffs into the lungs 2 (two) times daily.   Yes Historical Provider, MD  Cholecalciferol (VITAMIN D PO) Take 1 tablet by mouth daily.   Yes Historical Provider, MD  clarithromycin (BIAXIN) 500 MG tablet Take 500 mg by mouth 2 (two) times daily. For 10 days 08/26/15-09/05/15 08/26/15  Yes Historical Provider, MD  levofloxacin (LEVAQUIN) 500 MG tablet Take 500 mg by mouth daily. Reported on 08/23/2015 08/18/15   Historical Provider, MD   No Known Allergies  FAMILY HISTORY:  Family History  Problem Relation Age of Onset  . Lymphoma Mother   . CAD Mother    SOCIAL HISTORY:  reports that she has never smoked. She does not have any smokeless tobacco history on file. She reports that she drinks alcohol. She reports  that she does not use illicit drugs.  REVIEW OF SYSTEMS:  No "B" symptoms, no weight loss, no significant cough.  SUBJECTIVE:   VITAL SIGNS: Temp:  [97.6 F (36.4 C)-98.1 F (36.7 C)] 97.6 F (36.4 C) (01/14 0618) Pulse Rate:  [106-138] 106 (01/14 0618) Resp:  [16-26] 19 (01/14 0618) BP: (115-161)/(62-128) 143/96 mmHg (01/14 0618) SpO2:  [88 %-96 %] 90 % (01/14 0618) HEMODYNAMICS:   VENTILATOR SETTINGS:   INTAKE / OUTPUT: Intake/Output    None     PHYSICAL EXAMINATION: General:  Well appearing woman, NAD Neuro:  A&O x3 HEENT:  Normal Neck: No LAD Cardiovascular:  Heart rate normal Lungs:  Expiratory wheezes heard centrally. Abdomen:  Obese Musculoskeletal:  No joint swelling Skin:  No rashes.  Imaging Dg Chest 2 View  08/25/2015  CLINICAL DATA:  Shortness of breath. Pneumonia over the past 4 days. EXAM: CHEST  2 VIEW COMPARISON:  08/26/2015 FINDINGS: Worsening patchy bilateral airspace opacities most confluent in the right middle lobe. The these have a nodular component if and underlying malignancy, fungal disease, or fulminant tuberculosis cannot be excluded particularly if the patient has immunosuppression. Right lower paratracheal density may be incidental but could be from adenopathy. There is fullness of the left hilum. IMPRESSION: 1. Bilateral airspace opacities are worsened, with underlying interstitial accentuation and vague nodularity of these opacities. Although multilobar bacterial pneumonia is a for possibility, other possibilities might include metastatic disease to the lungs, fungal disease, fulminant tuberculosis, or pulmonary hemorrhage. Correlate with the patient's symptoms. Chest CT to be utilized for further  characterization if clinically warranted. Electronically Signed   By: Van Clines M.D.   On: 09/02/2015 16:59   Ct Angio Chest Pe W/cm &/or Wo Cm  08/28/2015  CLINICAL DATA:  Patient states ill since Christmas, diagnosed with pneumonia 4 days ago  on antibiotics with increase shortness of breath this morning. EXAM: CT ANGIOGRAPHY CHEST WITH CONTRAST TECHNIQUE: Multidetector CT imaging of the chest was performed using the standard protocol during bolus administration of intravenous contrast. Multiplanar CT image reconstructions and MIPs were obtained to evaluate the vascular anatomy. CONTRAST:  156m OMNIPAQUE IOHEXOL 350 MG/ML SOLN COMPARISON:  Chest x-ray 08/26/2015 and 08/26/2015 FINDINGS: Lungs are adequately inflated demonstrate multiple bilateral discrete nodules and nodular regions of airspace opacification. There is masslike consolidation over the right midlung continuous with soft tissue density over the right hilum and subcarinal region. There is no pleural effusion. There is extensive mediastinal and bilateral hilar adenopathy. Subcarinal adenopathy measures 3.2 cm by short axis. A large right peritracheal node measures 2.7 cm by short axis. Adenopathy extends into the superior mediastinum and neck base at the level of the thyroid gland bilateral. Findings are concerning for central right lung neoplasm with metastatic disease. Adenopathy causes narrowing of the distal aspect of the main pulmonary arteries bilaterally. There is associated airway obstruction over the central right middle lobe. Heart is normal in size. No definite evidence of pulmonary embolism. No axillary adenopathy. Images through the upper abdomen demonstrate a few small right pericardial phrenic lymph nodes. Adenopathy adjacent the gastroesophageal junction with lymph node measuring 1.8 cm by short axis. There are degenerative changes of the spine. Review of the MIP images confirms the above findings. IMPRESSION: Large central right middle lobe lung mass without clear borders likely with mild associated postobstructive atelectasis. Bulky mediastinal and bilateral hilar adenopathy as described. Multiple bilateral discrete pulmonary nodules and nodular areas of airspace  opacification likely metastatic disease. Right lung mass obstructs the right middle lobe bronchus. Adenopathy causes narrowing of the main pulmonary arteries. Adenopathy over the upper abdomen near the gastroesophageal junction likely metastatic disease. Recommend tissue diagnosis. No evidence of pulmonary embolism. Electronically Signed   By: DMarin OlpM.D.   On: 08/26/2015 20:38    ASSESSMENT / PLAN:  Ms. PPreteis an unfortunate 40y/o woman with imaging findings concerning for malignancy, but other items (sarcoidosis, COP) remain on the differential. A tissue diganosis will be impairitive. Additionally, [place] new markers such as EGFR, etc are critical in providing patients with targeted therapies. The patient will need a bronchoscopy with probable EBUS for staging, although the timing of this is not clear. We will keep the patient NPO and discuss with the daytime team what timing is appropriate. Additionally, consideration should be given for evaluation for interventional pulmonology at a tertiary/quatinary care center as she does have narrowing of her airways and may ultimately benefit from a stent, although her involved airways are likely distal at present.   ALuz Brazen MD Pulmonary and CMangumPager: [phone]  08/30/2015, 6:54 AM

## 2015-08-30 NOTE — ED Notes (Signed)
Pt gets very SOB when ambulating to restroom; increased O2 to 3L nasal cannula for pt comfort.  Pt O2 saturation remains in low 90s on 3L.

## 2015-08-30 NOTE — ED Notes (Signed)
Per Dr Charlies Silvers, pt provided with juice and water. Dr Charlies Silvers sts that she is placing orders for pt

## 2015-08-31 DIAGNOSIS — C801 Malignant (primary) neoplasm, unspecified: Secondary | ICD-10-CM

## 2015-08-31 DIAGNOSIS — D3910 Neoplasm of uncertain behavior of unspecified ovary: Secondary | ICD-10-CM

## 2015-08-31 LAB — VANCOMYCIN, TROUGH: VANCOMYCIN TR: 15 ug/mL (ref 10.0–20.0)

## 2015-08-31 LAB — HIV ANTIBODY (ROUTINE TESTING W REFLEX): HIV Screen 4th Generation wRfx: NONREACTIVE

## 2015-08-31 LAB — C DIFFICILE QUICK SCREEN W PCR REFLEX
C DIFFICILE (CDIFF) INTERP: NEGATIVE
C DIFFICILE (CDIFF) TOXIN: NEGATIVE
C Diff antigen: NEGATIVE

## 2015-08-31 MED ORDER — SODIUM CHLORIDE 0.9 % IJ SOLN: 10.0000 mL | INTRAMUSCULAR | Status: DC | PRN

## 2015-08-31 MED ORDER — LORAZEPAM 2 MG/ML IJ SOLN
INTRAMUSCULAR | Status: AC
  Administered 2015-08-31: 0.5 mg
  Filled 2015-08-31: qty 1

## 2015-08-31 MED ORDER — SIMETHICONE 80 MG PO CHEW
80.0000 mg | CHEWABLE_TABLET | Freq: Four times a day (QID) | ORAL | Status: DC | PRN
  Administered 2015-08-31: 80 mg via ORAL
  Filled 2015-08-31 (×2): qty 1

## 2015-08-31 MED ORDER — MORPHINE SULFATE (PF) 2 MG/ML IV SOLN
INTRAVENOUS | Status: AC
  Administered 2015-08-31: 0.5 mg via INTRAVENOUS
  Filled 2015-08-31: qty 1

## 2015-08-31 MED ORDER — LEVALBUTEROL HCL 1.25 MG/0.5ML IN NEBU
1.2500 mg | INHALATION_SOLUTION | Freq: Four times a day (QID) | RESPIRATORY_TRACT | Status: DC | PRN
  Administered 2015-09-01: 1.25 mg via RESPIRATORY_TRACT
  Filled 2015-08-31: qty 0.5

## 2015-08-31 MED ORDER — LEVALBUTEROL HCL 0.63 MG/3ML IN NEBU
0.6300 mg | INHALATION_SOLUTION | RESPIRATORY_TRACT | Status: DC
  Administered 2015-08-31 – 2015-09-01 (×5): 0.63 mg via RESPIRATORY_TRACT
  Filled 2015-08-31 (×8): qty 3

## 2015-08-31 MED ORDER — MORPHINE SULFATE (PF) 2 MG/ML IV SOLN
0.5000 mg | INTRAVENOUS | Status: DC | PRN
  Administered 2015-09-01 – 2015-09-03 (×5): 0.5 mg via INTRAVENOUS
  Filled 2015-08-31 (×7): qty 1

## 2015-08-31 MED ORDER — LORAZEPAM 2 MG/ML IJ SOLN
0.5000 mg | Freq: Four times a day (QID) | INTRAMUSCULAR | Status: DC | PRN
  Administered 2015-09-01 – 2015-09-04 (×2): 0.5 mg via INTRAVENOUS
  Filled 2015-08-31 (×3): qty 1

## 2015-08-31 MED ORDER — SODIUM CHLORIDE 0.9 % IJ SOLN
10.0000 mL | Freq: Two times a day (BID) | INTRAMUSCULAR | Status: DC
Start: 2015-08-31 — End: 2015-09-30
  Administered 2015-09-01: 20 mL
  Administered 2015-09-01 – 2015-09-09 (×11): 10 mL
  Administered 2015-09-09: 20 mL
  Administered 2015-09-10: 10 mL
  Administered 2015-09-10: 20 mL
  Administered 2015-09-11 – 2015-09-19 (×9): 10 mL
  Administered 2015-09-20: 20 mL
  Administered 2015-09-21 – 2015-09-28 (×12): 10 mL

## 2015-08-31 NOTE — Progress Notes (Signed)
Pharmacy Antibiotic Follow-up Note  Monique [name] is a 40 y.o. year-old female admitted on 08/17/2015.  The patient is currently on day 2 of Vancomycin 1g IV q8h and Zosyn 3.375g IV q8h (4 hour infusion time) for post-obstructive pneumonia.  Failed outpatient treatments with azithromycin, levofloxacin, clarithromycin in past month.  Assessment/Plan: This patient's current antibiotics will be continued without adjustments.  Obtain vancomycin trough this evening and may adjust dose based on level.  Temp (24hrs), Avg:98.9 F (37.2 C), Min:98.4 F (36.9 C), Max:99.2 F (37.3 C)   Recent Labs Lab 09/09/2015 1750 08/30/15 1826  WBC 16.9* 17.4*    Recent Labs Lab 09/09/2015 1750 08/30/15 1826  CREATININE 0.80 0.70   Estimated Creatinine Clearance: 99 mL/min (by C-G formula based on Cr of 0.7).    No Known Allergies  Antimicrobials this admission: 1/14 >> vancomycin >> 1/14 >> Zosyn >>   Levels/dose changes this admission: 1/15 1900 VT: ___ on 1g IV q8h  Microbiology results: 1/13 blood x 2 ngtd < 24 h HIV screen neg Strep pneumo ur ag neg MRSA neg Cdiff sent  Thank you for allowing pharmacy to be a part of this patient's care.  Hershal Coria PharmD 08/31/2015 1:45 PM

## 2015-08-31 NOTE — Consult Note (Deleted)
Reason for Consult: Pelvic mass Referring Physician: Leisa Lenz, MD  Monique [name] is an 40 y.o. female.  HPI:  40 yo P1 LMP 1/13 who is seen at the request of Leisa Lenz MD for further evaluation of a newly diagnosed pelvic mass.  The patient gives a several month history of cough, DOE and fevers/night sweats.  She has been treated for a community-acquired pneumonia.  She has also developed diarrhea.  She presented to the [facility] 2 days ago with worsening symptoms.  Labs were remarkable for a leukocytosis.  Imaging including a CT of the chest showed a right-middle lobe mass with lesions worrisome for metastatic disease.  A CT scan of the abdomen/pelvis a possible 10 cm adnexal mass.  She denies any changes in her weight, bladder or bowel habits prior to the onset of the diarrhea.  She denies any recent abnormal uterine bleeding.  Her Pap smears have been normal per her report.  She has not started routine mammogram screenings.  The couple uses the withdrawal method for contraception.  History reviewed. No pertinent past medical history.  History reviewed. No pertinent past surgical history.  Family History  Problem Relation Age of Onset  . Lymphoma Mother   . CAD Mother     Social History:  reports that she has never smoked. She does not have any smokeless tobacco history on file. She reports that she drinks alcohol. She reports that she does not use illicit drugs.  Allergies: No Known Allergies  Medications: I have reviewed the patient's current medications.  Results for orders placed or performed during the hospital encounter of 08/31/2015 (from the past 48 hour(s))  Culture, blood (Routine X 2) w Reflex to ID Panel     Status: None (Preliminary result)   Collection Time: 09/15/2015  5:40 PM  Result Value Ref Range   Specimen Description BLOOD LEFT ANTECUBITAL    Special Requests BOTTLES DRAWN AEROBIC ONLY 6CC    Culture      NO GROWTH < 24 HOURS Performed at [facility]     Report Status PENDING   Culture, blood (Routine X 2) w Reflex to ID Panel     Status: None (Preliminary result)   Collection Time: 09/11/2015  5:45 PM  Result Value Ref Range   Specimen Description BLOOD LEFT ANTECUBITAL    Special Requests BOTTLES DRAWN AEROBIC AND ANAEROBIC 6CC    Culture      NO GROWTH < 24 HOURS Performed at [facility]    Report Status PENDING   CBC with Differential/Platelet     Status: Abnormal   Collection Time: 08/26/2015  5:50 PM  Result Value Ref Range   WBC 16.9 (H) 4.0 - 10.5 K/uL   RBC 4.75 3.87 - 5.11 MIL/uL   Hemoglobin 13.0 12.0 - 15.0 g/dL   HCT 39.3 36.0 - 46.0 %   MCV 82.7 78.0 - 100.0 fL   MCH 27.4 26.0 - 34.0 pg   MCHC 33.1 30.0 - 36.0 g/dL   RDW 13.4 11.5 - 15.5 %   Platelets 461 (H) 150 - 400 K/uL   Neutrophils Relative % 84 %   Neutro Abs 14.1 (H) 1.7 - 7.7 K/uL   Lymphocytes Relative 5 %   Lymphs Abs 0.9 0.7 - 4.0 K/uL   Monocytes Relative 11 %   Monocytes Absolute 1.8 (H) 0.1 - 1.0 K/uL   Eosinophils Relative 0 %   Eosinophils Absolute 0.0 0.0 - 0.7 K/uL   Basophils Relative  0 %   Basophils Absolute 0.0 0.0 - 0.1 K/uL  Comprehensive metabolic panel     Status: Abnormal   Collection Time: 08/26/2015  5:50 PM  Result Value Ref Range   Sodium 136 135 - 145 mmol/L   Potassium 4.1 3.5 - 5.1 mmol/L   Chloride 100 (L) 101 - 111 mmol/L   CO2 22 22 - 32 mmol/L   Glucose, Bld 119 (H) 65 - 99 mg/dL   BUN 15 6 - 20 mg/dL   Creatinine, Ser 0.80 0.44 - 1.00 mg/dL   Calcium 9.7 8.9 - 10.3 mg/dL   Total Protein 7.4 6.5 - 8.1 g/dL   Albumin 3.6 3.5 - 5.0 g/dL   AST 68 (H) 15 - 41 U/L   ALT 16 14 - 54 U/L   Alkaline Phosphatase 70 38 - 126 U/L   Total Bilirubin 1.2 0.3 - 1.2 mg/dL   GFR calc non Af Amer >60 >60 mL/min   GFR calc Af Amer >60 >60 mL/min    Comment: (NOTE) The eGFR has been calculated using the CKD EPI equation. This calculation has not been validated in all clinical situations. eGFR's persistently <60 mL/min signify  possible Chronic Kidney Disease.    Anion gap 14 5 - 15  I-Stat CG4 Lactic Acid, ED     Status: Abnormal   Collection Time: 08/18/2015  6:00 PM  Result Value Ref Range   Lactic Acid, Venous 2.94 (HH) 0.5 - 2.0 mmol/L   Comment NOTIFIED PHYSICIAN   I-Stat CG4 Lactic Acid, ED     Status: Abnormal   Collection Time: 08/18/2015  8:48 PM  Result Value Ref Range   Lactic Acid, Venous 2.17 (HH) 0.5 - 2.0 mmol/L   Comment NOTIFIED PHYSICIAN   TSH     Status: None   Collection Time: 08/30/15 10:30 AM  Result Value Ref Range   TSH 0.550 0.350 - 4.500 uIU/mL  HIV antibody     Status: None   Collection Time: 08/30/15 11:59 AM  Result Value Ref Range   HIV Screen 4th Generation wRfx Non Reactive Non Reactive    Comment: (NOTE) Performed At: [place], [place] [id number] Lindon Romp MD RP:[id number]   MRSA PCR Screening     Status: None   Collection Time: 08/30/15  2:30 PM  Result Value Ref Range   MRSA by PCR NEGATIVE NEGATIVE    Comment:        The GeneXpert MRSA Assay (FDA approved for NASAL specimens only), is one component of a comprehensive MRSA colonization surveillance program. It is not intended to diagnose MRSA infection nor to guide or monitor treatment for MRSA infections.   Strep pneumoniae urinary antigen     Status: None   Collection Time: 08/30/15  5:00 PM  Result Value Ref Range   Strep Pneumo Urinary Antigen NEGATIVE NEGATIVE    Comment:        Infection due to S. pneumoniae cannot be absolutely ruled out since the antigen present may be below the detection limit of the test. Performed at [facility]   Magnesium     Status: None   Collection Time: 08/30/15  6:26 PM  Result Value Ref Range   Magnesium 2.2 1.7 - 2.4 mg/dL  Phosphorus     Status: None   Collection Time: 08/30/15  6:26 PM  Result Value Ref Range   Phosphorus 2.7 2.5 - 4.6 mg/dL  Comprehensive metabolic panel  Status: Abnormal   Collection  Time: 08/30/15  6:26 PM  Result Value Ref Range   Sodium 132 (L) 135 - 145 mmol/L   Potassium 4.2 3.5 - 5.1 mmol/L   Chloride 97 (L) 101 - 111 mmol/L   CO2 22 22 - 32 mmol/L   Glucose, Bld 93 65 - 99 mg/dL   BUN 15 6 - 20 mg/dL   Creatinine, Ser 0.70 0.44 - 1.00 mg/dL   Calcium 8.9 8.9 - 10.3 mg/dL   Total Protein 6.7 6.5 - 8.1 g/dL   Albumin 3.3 (L) 3.5 - 5.0 g/dL   AST 63 (H) 15 - 41 U/L   ALT 15 14 - 54 U/L   Alkaline Phosphatase 74 38 - 126 U/L   Total Bilirubin 1.4 (H) 0.3 - 1.2 mg/dL   GFR calc non Af Amer >60 >60 mL/min   GFR calc Af Amer >60 >60 mL/min    Comment: (NOTE) The eGFR has been calculated using the CKD EPI equation. This calculation has not been validated in all clinical situations. eGFR's persistently <60 mL/min signify possible Chronic Kidney Disease.    Anion gap 13 5 - 15  CBC     Status: Abnormal   Collection Time: 08/30/15  6:26 PM  Result Value Ref Range   WBC 17.4 (H) 4.0 - 10.5 K/uL   RBC 4.57 3.87 - 5.11 MIL/uL   Hemoglobin 12.3 12.0 - 15.0 g/dL   HCT 37.8 36.0 - 46.0 %   MCV 82.7 78.0 - 100.0 fL   MCH 26.9 26.0 - 34.0 pg   MCHC 32.5 30.0 - 36.0 g/dL   RDW 13.5 11.5 - 15.5 %   Platelets 387 150 - 400 K/uL  Protime-INR     Status: None   Collection Time: 08/30/15  6:26 PM  Result Value Ref Range   Prothrombin Time 15.2 11.6 - 15.2 seconds   INR 1.18 0.00 - 1.49    Dg Chest 2 View  08/19/2015  CLINICAL DATA:  Shortness of breath. Pneumonia over the past 4 days. EXAM: CHEST  2 VIEW COMPARISON:  08/26/2015 FINDINGS: Worsening patchy bilateral airspace opacities most confluent in the right middle lobe. The these have a nodular component if and underlying malignancy, fungal disease, or fulminant tuberculosis cannot be excluded particularly if the patient has immunosuppression. Right lower paratracheal density may be incidental but could be from adenopathy. There is fullness of the left hilum. IMPRESSION: 1. Bilateral airspace opacities are worsened,  with underlying interstitial accentuation and vague nodularity of these opacities. Although multilobar bacterial pneumonia is a for possibility, other possibilities might include metastatic disease to the lungs, fungal disease, fulminant tuberculosis, or pulmonary hemorrhage. Correlate with the patient's symptoms. Chest CT to be utilized for further characterization if clinically warranted. Electronically Signed   By: Van Clines M.D.   On: 08/20/2015 16:59   Ct Angio Chest Pe W/cm &/or Wo Cm  09/15/2015  CLINICAL DATA:  Patient states ill since Christmas, diagnosed with pneumonia 4 days ago on antibiotics with increase shortness of breath this morning. EXAM: CT ANGIOGRAPHY CHEST WITH CONTRAST TECHNIQUE: Multidetector CT imaging of the chest was performed using the standard protocol during bolus administration of intravenous contrast. Multiplanar CT image reconstructions and MIPs were obtained to evaluate the vascular anatomy. CONTRAST:  174m OMNIPAQUE IOHEXOL 350 MG/ML SOLN COMPARISON:  Chest x-ray 08/22/2015 and 08/26/2015 FINDINGS: Lungs are adequately inflated demonstrate multiple bilateral discrete nodules and nodular regions of airspace opacification. There is masslike consolidation over the  right midlung continuous with soft tissue density over the right hilum and subcarinal region. There is no pleural effusion. There is extensive mediastinal and bilateral hilar adenopathy. Subcarinal adenopathy measures 3.2 cm by short axis. A large right peritracheal node measures 2.7 cm by short axis. Adenopathy extends into the superior mediastinum and neck base at the level of the thyroid gland bilateral. Findings are concerning for central right lung neoplasm with metastatic disease. Adenopathy causes narrowing of the distal aspect of the main pulmonary arteries bilaterally. There is associated airway obstruction over the central right middle lobe. Heart is normal in size. No definite evidence of pulmonary  embolism. No axillary adenopathy. Images through the upper abdomen demonstrate a few small right pericardial phrenic lymph nodes. Adenopathy adjacent the gastroesophageal junction with lymph node measuring 1.8 cm by short axis. There are degenerative changes of the spine. Review of the MIP images confirms the above findings. IMPRESSION: Large central right middle lobe lung mass without clear borders likely with mild associated postobstructive atelectasis. Bulky mediastinal and bilateral hilar adenopathy as described. Multiple bilateral discrete pulmonary nodules and nodular areas of airspace opacification likely metastatic disease. Right lung mass obstructs the right middle lobe bronchus. Adenopathy causes narrowing of the main pulmonary arteries. Adenopathy over the upper abdomen near the gastroesophageal junction likely metastatic disease. Recommend tissue diagnosis. No evidence of pulmonary embolism. Electronically Signed   By: Marin Olp M.D.   On: 09/09/2015 20:38   Ct Abdomen Pelvis W Contrast  08/30/2015  CLINICAL DATA:  Pulmonary mass and bilateral metastatic nodules. EXAM: CT ABDOMEN AND PELVIS WITH CONTRAST TECHNIQUE: Multidetector CT imaging of the abdomen and pelvis was performed using the standard protocol following bolus administration of intravenous contrast. CONTRAST:  104m OMNIPAQUE IOHEXOL 300 MG/ML SOLN, 1052mOMNIPAQUE IOHEXOL 300 MG/ML SOLN COMPARISON:  Chest CT 08/30/2015 FINDINGS: Lower chest: Mass in the rib RIGHT lower lobe/RIGHT middle lobe is again demonstrated. Multiple bilateral pulmonary nodules. Hepatobiliary: No focal hepatic lesion. No biliary duct dilatation. Gallbladder is normal. Common bile duct is normal. Pancreas: Pancreas is normal. No ductal dilatation. No pancreatic inflammation. Spleen: Normal spleen Adrenals/urinary tract: Adrenal glands and kidneys are normal. The ureters and bladder normal. Stomach/Bowel: Stomach, small bowel, appendix, and cecum are normal. The  colon and rectosigmoid colon are normal. Vascular/Lymphatic: Abdominal aorta normal caliber. Gastrohepatic ligament and periaortic retroperitoneal adenopathy noted. Example gastrohepatic lymph node measures 19 mm (image 14, series 2). LEFT periaortic lymph node measures 1.9 cm (image 28, series 2). Reproductive: There is a large mass within the central pelvis superior to the uterus measuring 10 cm by 9.5 cm (image 58, series 2). This mass positioned between the ovaries. RIGHT ovary appears normal. LEFT ovary is not as well-defined. Uterus appears normal. Other: Free fluid the pelvis. Musculoskeletal: No aggressive osseous lesion. IMPRESSION: 1. Large mass superior to the uterus and position between the ovaries measuring up to 10 cm. Differential includes metastasis from potential bronchogenic carcinoma versus a PRIMARY OVARIAN MALIGNANCY. Lymphoma could also be considered. 2. Retroperitoneal and gastrohepatic ligament metastatic adenopathy. 3. 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 Differential would include primary bronchogenic carcinoma including SMALL CELL LUNG CARCINOMA. Electronically Signed   By: StSuzy Bouchard.D.   On: 08/30/2015 19:15    Review of Systems  Constitutional: Positive for fever.  Respiratory: Positive for cough and shortness of breath.   Gastrointestinal: Negative for abdominal pain.   Blood pressure 166/100, pulse 126, temperature 98.4 F (36.9 C), temperature source Oral, resp. rate 36,  height 5' 5.5" (1.664 m), weight 177 lb 7.5 oz (80.5 kg), last menstrual period 08/26/2015, SpO2 94 %. Physical Exam  Constitutional: She appears well-developed. No distress.  GI: Soft. She exhibits no distension. There is no tenderness.    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   No obvious peritoneal carcinomatosis on imaging  >aggree with efforts to establish a tissue diagnosis; biopsies  planned of the pulmonary lesion and periaortic lymph node >will check tumor markers >will continue to follow along with you  JACKSON-MOORE,Koty Anctil A 08/31/2015, 2:32 PM

## 2015-08-31 NOTE — Progress Notes (Addendum)
Pt was lying in bed, w/oxygen, when I arrived. Her friend and co-worker was bedside. She indicated problems w/breathing (of which nurse was aware and attempting to treat). Because of her breathing, [place] visit was brief. Explained to pt we are here for her whenever she needs.  Please page whenever support is needed. [place], MDiv   08/31/15 1500  Clinical Encounter Type  Visited With Patient and family together

## 2015-08-31 NOTE — Progress Notes (Signed)
Patient ID: Monique [name], female   DOB: 1975-09-10, 40 y.o.   MRN: [id number] We are referred this patient for possible abdominal biopsy. CT CAP demonstrates widespread lung lesions, adenopathy, and a large pelvic mass. Rec Gyn/Onc consultation prior to pelvic mass biopsy.

## 2015-08-31 NOTE — Progress Notes (Addendum)
Patient ID: Monique [name], female   DOB: [birth year], 40 y.o.   MRN: [id number] [facility] PROGRESS NOTE  Daizee Firmin Mcdermott YTK:[id number] DOB: 04-25-1976 DOA: 08/18/2015 PCP: No primary care provider on file.  Brief narrative:    40 year old female with no significant past medical history, has family history of lymphoma in her mother (succesfully treated), patient husband smokes but not in household and pt does not smoke. She first presented to PCP 12/19 because of shortness of breath and was given zpack. She got better initially but then started to get short of breath. She went to PCP and was given albuterol inhaler, got better for a short time and then again shortness of breath started. She went back to PCP and was given Levaquin and steroids which helped for few days but then she again got short of breath and came to ED for further evaluation.   In ED, CXR and CT chest with findings concerning for malignancy. She has large central right middle lobe lung mass associated with postobstructive atelectasis, bulky mediastinal and bilateral hilar adenopathy, multiple bilateral discrete pulmonary nodules and nodular areas of airspace opacification likely representing metastatic disease, right lung mass obstructing the right middle lobe bronchus, adenopathy causing the narrowing of the main pulmonary arteries. Further imaging of the abdomen area points towards malignancy. She has a large mass superior to the uterus positioned between the ovaries and measures about 10 cm with a differential including metastatic disease from bronchogenic carcinoma or primary ovarian carcinoma and possible lymphoma. She also has retroperitoneal and gastrohepatic ligament metastatic adenopathy. Also visualized is again a right lower lobe mass with pulmonary metastasis.   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 I spoke with GYN oncology who will see the patient in consultation and at this point suggesting biopsying lung mass and not biopsying the mass superior to the uterus  - I also spoke with oncology, Dr. 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 - Appreciate very much everyone's help and input for this patient - Per GYN oncology, prefer to have biopsy of lung mass  Sepsis due to postobstructive pneumonia - Sepsis criteria met on admission with tachycardia, tachypnea, hypoxia, leukocytosis, lactic acidosis - Continue vanco and zosyn - Blood cultures so far negative  - Resp culture is pending, legionella are pending  - Strep pneumo is negative   DVT Prophylaxis  - SCD's bilaterally in hospital    Code Status: Full.  Family Communication:  plan of care discussed with the patient and husband at the bedside  Disposition Plan: Patient is acutely ill, unclear at this point when she will be stable for discharge  IV access:  Peripheral IV  Procedures and diagnostic studies:    Ct Abdomen Pelvis W Contrast 08/30/2015  1. Large mass superior to the uterus and  position between the ovaries measuring up to 10 cm. Differential includes metastasis from potential bronchogenic carcinoma versus a PRIMARY OVARIAN MALIGNANCY. Lymphoma could also be considered. 2. Retroperitoneal and  gastrohepatic ligament metastatic adenopathy. 3. 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 Differential would include primary bronchogenic carcinoma including SMALL CELL LUNG CARCINOMA. Electronically Signed   By: Suzy Bouchard M.D.   On: 08/30/2015 19:15   Dg Chest 2 View 08/28/2015   1. Bilateral airspace opacities are worsened, with underlying interstitial accentuation and vague nodularity of these opacities. Although multilobar bacterial pneumonia is a for possibility, other possibilities might include metastatic disease to the lungs, fungal disease, fulminant tuberculosis, or pulmonary hemorrhage. Correlate with the patient's symptoms. Chest CT to be utilized for further characterization if clinically warranted. Electronically Signed   By: Van Clines M.D.   On: 08/28/2015 16:59   Dg Chest 2 View 08/26/2015  Multifocal alveolar opacities bilaterally most compatible with pneumonia. The right middle lobe appears the most significantly involved. These results will be called to the ordering clinician or representative by the Radiologist Assistant, and communication documented in the PACS or zVision Dashboard. Electronically Signed   By: David  Martinique M.D.   On: 08/26/2015 12:31   Ct Angio Chest Pe W/cm &/or Wo Cm 09/15/2015  Large central right middle lobe lung mass without clear borders likely with mild associated postobstructive atelectasis. Bulky mediastinal and bilateral hilar adenopathy as described. Multiple bilateral discrete pulmonary nodules and nodular areas of airspace opacification likely metastatic disease. Right lung mass obstructs the right middle lobe bronchus. Adenopathy causes narrowing of the main pulmonary arteries. Adenopathy over the upper abdomen near the gastroesophageal junction likely metastatic disease. Recommend tissue diagnosis. No evidence of pulmonary embolism. Electronically Signed   By: Marin Olp M.D.   On: 08/25/2015 20:38    Medical  Consultants:  Pulmonary  GYN ON - Dr. Delsa Sale Oncology, Dr. Alen Blew IR  Other Consultants:  None   IAnti-Infectives:   Azithromycin and rocephin 1/13 Vanco and zosyn 08/30/2015 -->    Leisa Lenz, MD  [facility] Pager [phone]  Time spent in minutes: 25 minutes  If 7PM-7AM, please contact night-coverage [email or web address] Password [facility] 08/31/2015, 9:17 AM   LOS: 1 day    HPI/Subjective: No acute overnight events. Patient still short of breath.  Objective: Filed Vitals:   08/31/15 0300 08/31/15 0400 08/31/15 0500 08/31/15 0600  BP: 143/96 154/96 140/123 142/88  Pulse: 134 135 138 134  Temp:  98.4 F (36.9 C)    TempSrc:  Oral    Resp: 35 29 30 35  Height:      Weight:      SpO2: 93% 94% 92% 92%    Intake/Output Summary (Last 24 hours) at 08/31/15 0917 Last data filed at 08/31/15 0600  Gross per 24 hour  Intake   2515 ml  Output      0 ml  Net   2515 ml    Exam:   General:  Pt is alert, not in acute distress  Cardiovascular: Tachycardic, S1/S2 (+)  Respiratory: diminished with wheezing   Abdomen: obese, non tender, (+) BS  Extremities: No swelling, pulses palpable bilaterally  Neuro: Nonfocal  Data Reviewed: Basic Metabolic Panel:  Recent Labs Lab 08/25/2015 1750 08/30/15 1826  NA 136 132*  K 4.1 4.2  CL 100* 97*  CO2 22 22  GLUCOSE 119* 93  BUN 15 15  CREATININE 0.80 0.70  CALCIUM 9.7 8.9  MG  --  2.2  PHOS  --  2.7   Liver Function Tests:  Recent Labs Lab 08/30/2015 1750 08/30/15 1826  AST 68* 63*  ALT 16 15  ALKPHOS 70 74  BILITOT 1.2 1.4*  PROT 7.4 6.7  ALBUMIN 3.6 3.3*   No results for input(s): LIPASE, AMYLASE in the last 168 hours. No results for input(s): AMMONIA in the last 168 hours. CBC:  Recent Labs Lab 09/10/2015 1750 08/30/15 1826  WBC 16.9* 17.4*  NEUTROABS 14.1*  --   HGB 13.0 12.3  HCT 39.3 37.8  MCV 82.7 82.7  PLT 461* 387   Cardiac Enzymes: No results for input(s): CKTOTAL, CKMB,  CKMBINDEX, TROPONINI in the last 168 hours. BNP: Invalid input(s): POCBNP CBG: No results for input(s): GLUCAP in the last 168 hours.  Recent Results (from the past 240 hour(s))  Culture, blood (Routine X 2) w Reflex to ID Panel     Status: None (Preliminary result)   Collection Time: 09/15/2015  5:40 PM  Result Value Ref Range Status   Specimen Description BLOOD LEFT ANTECUBITAL  Final   Special Requests BOTTLES DRAWN AEROBIC ONLY [place]  Final   Culture   Final    NO GROWTH < 24 HOURS Performed at [facility]    Report Status PENDING  Incomplete  Culture, blood (Routine X 2) w Reflex to ID Panel     Status: None (Preliminary result)   Collection Time: 08/19/2015  5:45 PM  Result Value Ref Range Status   Specimen Description BLOOD LEFT ANTECUBITAL  Final   Special Requests BOTTLES DRAWN AEROBIC AND ANAEROBIC 6CC  Final   Culture   Final    NO GROWTH < 24 HOURS Performed at [facility]    Report Status PENDING  Incomplete  MRSA PCR Screening     Status: None   Collection Time: 08/30/15  2:30 PM  Result Value Ref Range Status   MRSA by PCR NEGATIVE NEGATIVE Final    Comment:        The GeneXpert MRSA Assay (FDA approved for NASAL specimens only), is one component of a comprehensive MRSA colonization surveillance program. It is not intended to diagnose MRSA infection nor to guide or monitor treatment for MRSA infections.      Scheduled Meds: . budesonide-formoterol  2 puff Inhalation BID  . guaiFENesin  600 mg Oral BID  . levalbuterol  0.63 mg Nebulization Q4H  . piperacillin-tazobactam (ZOSYN)  IV  3.375 g Intravenous Q8H  . vancomycin  1,000 mg Intravenous Q8H   Continuous Infusions: . sodium chloride 10 mL/hr at 08/30/15 2000

## 2015-08-31 NOTE — Consult Note (Signed)
Reason for Consult: Pelvic mass Referring Physician: Leisa Lenz, MD  Monique [name] is an 40 y.o. female.  HPI:  40 yo P1 LMP 1/13 who is seen at the request of Leisa Lenz MD for further evaluation of [name] newly diagnosed pelvic mass.  The patient gives [name] several month history of cough, DOE and fevers/night sweats.  She has been treated for [name] community-acquired pneumonia.  She has also developed diarrhea.  She presented to the [facility] 2 days ago with worsening symptoms.  Labs were remarkable for [name] leukocytosis.  Imaging including [name] CT of the chest showed [name] right-middle lobe mass with lesions worrisome for metastatic disease.  [name] CT scan of the abdomen/pelvis [name] possible 10 cm adnexal mass.  She denies any changes in her weight, bladder or bowel habits prior to the onset of the diarrhea.  She denies any recent abnormal uterine bleeding.  Her Pap smears have been normal per her report.  She has not started routine mammogram screenings.  The couple uses the withdrawal method for contraception.  History reviewed. No pertinent past medical history.  History reviewed. No pertinent past surgical history.  Family History  Problem Relation Age of Onset  . Lymphoma Mother   . CAD Mother     Social History:  reports that she has never smoked. She does not have any smokeless tobacco history on file. She reports that she drinks alcohol. She reports that she does not use illicit drugs.  Allergies: No Known Allergies  Medications: I have reviewed the patient's current medications.  Results for orders placed or performed during the hospital encounter of 08/31/2015 (from the past 48 hour(s))  Culture, blood (Routine X 2) w Reflex to ID Panel     Status: None (Preliminary result)   Collection Time: 09/15/2015  5:40 PM  Result Value Ref Range   Specimen Description BLOOD LEFT ANTECUBITAL    Special Requests BOTTLES DRAWN AEROBIC ONLY 6CC    Culture      NO GROWTH < 24 HOURS Performed at [facility]     Report Status PENDING   Culture, blood (Routine X 2) w Reflex to ID Panel     Status: None (Preliminary result)   Collection Time: 09/11/2015  5:45 PM  Result Value Ref Range   Specimen Description BLOOD LEFT ANTECUBITAL    Special Requests BOTTLES DRAWN AEROBIC AND ANAEROBIC 6CC    Culture      NO GROWTH < 24 HOURS Performed at [facility]    Report Status PENDING   CBC with Differential/Platelet     Status: Abnormal   Collection Time: 08/26/2015  5:50 PM  Result Value Ref Range   WBC 16.9 (H) 4.0 - 10.5 K/uL   RBC 4.75 3.87 - 5.11 MIL/uL   Hemoglobin 13.0 12.0 - 15.0 g/dL   HCT 39.3 36.0 - 46.0 %   MCV 82.7 78.0 - 100.0 fL   MCH 27.4 26.0 - 34.0 pg   MCHC 33.1 30.0 - 36.0 g/dL   RDW 13.4 11.5 - 15.5 %   Platelets 461 (H) 150 - 400 K/uL   Neutrophils Relative % 84 %   Neutro Abs 14.1 (H) 1.7 - 7.7 K/uL   Lymphocytes Relative 5 %   Lymphs Abs 0.9 0.7 - 4.0 K/uL   Monocytes Relative 11 %   Monocytes Absolute 1.8 (H) 0.1 - 1.0 K/uL   Eosinophils Relative 0 %   Eosinophils Absolute 0.0 0.0 - 0.7 K/uL   Basophils Relative  0 %   Basophils Absolute 0.0 0.0 - 0.1 K/uL  Comprehensive metabolic panel     Status: Abnormal   Collection Time: 08/26/2015  5:50 PM  Result Value Ref Range   Sodium 136 135 - 145 mmol/L   Potassium 4.1 3.5 - 5.1 mmol/L   Chloride 100 (L) 101 - 111 mmol/L   CO2 22 22 - 32 mmol/L   Glucose, Bld 119 (H) 65 - 99 mg/dL   BUN 15 6 - 20 mg/dL   Creatinine, Ser 0.80 0.44 - 1.00 mg/dL   Calcium 9.7 8.9 - 10.3 mg/dL   Total Protein 7.4 6.5 - 8.1 g/dL   Albumin 3.6 3.5 - 5.0 g/dL   AST 68 (H) 15 - 41 U/L   ALT 16 14 - 54 U/L   Alkaline Phosphatase 70 38 - 126 U/L   Total Bilirubin 1.2 0.3 - 1.2 mg/dL   GFR calc non Af Amer >60 >60 mL/min   GFR calc Af Amer >60 >60 mL/min    Comment: (NOTE) The eGFR has been calculated using the CKD EPI equation. This calculation has not been validated in all clinical situations. eGFR's persistently <60 mL/min signify  possible Chronic Kidney Disease.    Anion gap 14 5 - 15  I-Stat CG4 Lactic Acid, ED     Status: Abnormal   Collection Time: 08/18/2015  6:00 PM  Result Value Ref Range   Lactic Acid, Venous 2.94 (HH) 0.5 - 2.0 mmol/L   Comment NOTIFIED PHYSICIAN   I-Stat CG4 Lactic Acid, ED     Status: Abnormal   Collection Time: 08/18/2015  8:48 PM  Result Value Ref Range   Lactic Acid, Venous 2.17 (HH) 0.5 - 2.0 mmol/L   Comment NOTIFIED PHYSICIAN   TSH     Status: None   Collection Time: 08/30/15 10:30 AM  Result Value Ref Range   TSH 0.550 0.350 - 4.500 uIU/mL  HIV antibody     Status: None   Collection Time: 08/30/15 11:59 AM  Result Value Ref Range   HIV Screen 4th Generation wRfx Non Reactive Non Reactive    Comment: (NOTE) Performed At: [place], [place] [id number] Lindon Romp MD RP:[id number]   MRSA PCR Screening     Status: None   Collection Time: 08/30/15  2:30 PM  Result Value Ref Range   MRSA by PCR NEGATIVE NEGATIVE    Comment:        The GeneXpert MRSA Assay (FDA approved for NASAL specimens only), is one component of [name] comprehensive MRSA colonization surveillance program. It is not intended to diagnose MRSA infection nor to guide or monitor treatment for MRSA infections.   Strep pneumoniae urinary antigen     Status: None   Collection Time: 08/30/15  5:00 PM  Result Value Ref Range   Strep Pneumo Urinary Antigen NEGATIVE NEGATIVE    Comment:        Infection due to S. pneumoniae cannot be absolutely ruled out since the antigen present may be below the detection limit of the test. Performed at [facility]   Magnesium     Status: None   Collection Time: 08/30/15  6:26 PM  Result Value Ref Range   Magnesium 2.2 1.7 - 2.4 mg/dL  Phosphorus     Status: None   Collection Time: 08/30/15  6:26 PM  Result Value Ref Range   Phosphorus 2.7 2.5 - 4.6 mg/dL  Comprehensive metabolic panel  Status: Abnormal   Collection  Time: 08/30/15  6:26 PM  Result Value Ref Range   Sodium 132 (L) 135 - 145 mmol/L   Potassium 4.2 3.5 - 5.1 mmol/L   Chloride 97 (L) 101 - 111 mmol/L   CO2 22 22 - 32 mmol/L   Glucose, Bld 93 65 - 99 mg/dL   BUN 15 6 - 20 mg/dL   Creatinine, Ser 0.70 0.44 - 1.00 mg/dL   Calcium 8.9 8.9 - 10.3 mg/dL   Total Protein 6.7 6.5 - 8.1 g/dL   Albumin 3.3 (L) 3.5 - 5.0 g/dL   AST 63 (H) 15 - 41 U/L   ALT 15 14 - 54 U/L   Alkaline Phosphatase 74 38 - 126 U/L   Total Bilirubin 1.4 (H) 0.3 - 1.2 mg/dL   GFR calc non Af Amer >60 >60 mL/min   GFR calc Af Amer >60 >60 mL/min    Comment: (NOTE) The eGFR has been calculated using the CKD EPI equation. This calculation has not been validated in all clinical situations. eGFR's persistently <60 mL/min signify possible Chronic Kidney Disease.    Anion gap 13 5 - 15  CBC     Status: Abnormal   Collection Time: 08/30/15  6:26 PM  Result Value Ref Range   WBC 17.4 (H) 4.0 - 10.5 K/uL   RBC 4.57 3.87 - 5.11 MIL/uL   Hemoglobin 12.3 12.0 - 15.0 g/dL   HCT 37.8 36.0 - 46.0 %   MCV 82.7 78.0 - 100.0 fL   MCH 26.9 26.0 - 34.0 pg   MCHC 32.5 30.0 - 36.0 g/dL   RDW 13.5 11.5 - 15.5 %   Platelets 387 150 - 400 K/uL  Protime-INR     Status: None   Collection Time: 08/30/15  6:26 PM  Result Value Ref Range   Prothrombin Time 15.2 11.6 - 15.2 seconds   INR 1.18 0.00 - 1.49    Dg Chest 2 View  08/27/2015  CLINICAL DATA:  Shortness of breath. Pneumonia over the past 4 days. EXAM: CHEST  2 VIEW COMPARISON:  08/26/2015 FINDINGS: Worsening patchy bilateral airspace opacities most confluent in the right middle lobe. The these have [name] nodular component if and underlying malignancy, fungal disease, or fulminant tuberculosis cannot be excluded particularly if the patient has immunosuppression. Right lower paratracheal density may be incidental but could be from adenopathy. There is fullness of the left hilum. IMPRESSION: 1. Bilateral airspace opacities are worsened,  with underlying interstitial accentuation and vague nodularity of these opacities. Although multilobar bacterial pneumonia is [name] for possibility, other possibilities might include metastatic disease to the lungs, fungal disease, fulminant tuberculosis, or pulmonary hemorrhage. Correlate with the patient's symptoms. Chest CT to be utilized for further characterization if clinically warranted. Electronically Signed   By: Van Clines M.D.   On: 09/06/2015 16:59   Ct Angio Chest Pe W/cm &/or Wo Cm  09/01/2015  CLINICAL DATA:  Patient states ill since Christmas, diagnosed with pneumonia 4 days ago on antibiotics with increase shortness of breath this morning. EXAM: CT ANGIOGRAPHY CHEST WITH CONTRAST TECHNIQUE: Multidetector CT imaging of the chest was performed using the standard protocol during bolus administration of intravenous contrast. Multiplanar CT image reconstructions and MIPs were obtained to evaluate the vascular anatomy. CONTRAST:  174m OMNIPAQUE IOHEXOL 350 MG/ML SOLN COMPARISON:  Chest x-ray 08/27/2015 and 08/26/2015 FINDINGS: Lungs are adequately inflated demonstrate multiple bilateral discrete nodules and nodular regions of airspace opacification. There is masslike consolidation over the  right midlung continuous with soft tissue density over the right hilum and subcarinal region. There is no pleural effusion. There is extensive mediastinal and bilateral hilar adenopathy. Subcarinal adenopathy measures 3.2 cm by short axis. [name] large right peritracheal node measures 2.7 cm by short axis. Adenopathy extends into the superior mediastinum and neck base at the level of the thyroid gland bilateral. Findings are concerning for central right lung neoplasm with metastatic disease. Adenopathy causes narrowing of the distal aspect of the main pulmonary arteries bilaterally. There is associated airway obstruction over the central right middle lobe. Heart is normal in size. No definite evidence of pulmonary  embolism. No axillary adenopathy. Images through the upper abdomen demonstrate [name] few small right pericardial phrenic lymph nodes. Adenopathy adjacent the gastroesophageal junction with lymph node measuring 1.8 cm by short axis. There are degenerative changes of the spine. Review of the MIP images confirms the above findings. IMPRESSION: Large central right middle lobe lung mass without clear borders likely with mild associated postobstructive atelectasis. Bulky mediastinal and bilateral hilar adenopathy as described. Multiple bilateral discrete pulmonary nodules and nodular areas of airspace opacification likely metastatic disease. Right lung mass obstructs the right middle lobe bronchus. Adenopathy causes narrowing of the main pulmonary arteries. Adenopathy over the upper abdomen near the gastroesophageal junction likely metastatic disease. Recommend tissue diagnosis. No evidence of pulmonary embolism. Electronically Signed   By: Marin Olp M.D.   On: 09/09/2015 20:38   Ct Abdomen Pelvis W Contrast  08/30/2015  CLINICAL DATA:  Pulmonary mass and bilateral metastatic nodules. EXAM: CT ABDOMEN AND PELVIS WITH CONTRAST TECHNIQUE: Multidetector CT imaging of the abdomen and pelvis was performed using the standard protocol following bolus administration of intravenous contrast. CONTRAST:  104m OMNIPAQUE IOHEXOL 300 MG/ML SOLN, 1052mOMNIPAQUE IOHEXOL 300 MG/ML SOLN COMPARISON:  Chest CT 08/30/2015 FINDINGS: Lower chest: Mass in the rib RIGHT lower lobe/RIGHT middle lobe is again demonstrated. Multiple bilateral pulmonary nodules. Hepatobiliary: No focal hepatic lesion. No biliary duct dilatation. Gallbladder is normal. Common bile duct is normal. Pancreas: Pancreas is normal. No ductal dilatation. No pancreatic inflammation. Spleen: Normal spleen Adrenals/urinary tract: Adrenal glands and kidneys are normal. The ureters and bladder normal. Stomach/Bowel: Stomach, small bowel, appendix, and cecum are normal. The  colon and rectosigmoid colon are normal. Vascular/Lymphatic: Abdominal aorta normal caliber. Gastrohepatic ligament and periaortic retroperitoneal adenopathy noted. Example gastrohepatic lymph node measures 19 mm (image 14, series 2). LEFT periaortic lymph node measures 1.9 cm (image 28, series 2). Reproductive: There is [name] large mass within the central pelvis superior to the uterus measuring 10 cm by 9.5 cm (image 58, series 2). This mass positioned between the ovaries. RIGHT ovary appears normal. LEFT ovary is not as well-defined. Uterus appears normal. Other: Free fluid the pelvis. Musculoskeletal: No aggressive osseous lesion. IMPRESSION: 1. Large mass superior to the uterus and position between the ovaries measuring up to 10 cm. Differential includes metastasis from potential bronchogenic carcinoma versus [name] PRIMARY OVARIAN MALIGNANCY. Lymphoma could also be considered. 2. Retroperitoneal and gastrohepatic ligament metastatic adenopathy. 3. 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 Differential would include primary bronchogenic carcinoma including SMALL CELL LUNG CARCINOMA. Electronically Signed   By: StSuzy Bouchard.D.   On: 08/30/2015 19:15    Review of Systems  Constitutional: Positive for fever.  Respiratory: Positive for cough and shortness of breath.   Gastrointestinal: Negative for abdominal pain.   Blood pressure 166/100, pulse 126, temperature 98.4 F (36.9 C), temperature source Oral, resp. rate 36,  height 5' 5.5" (1.664 m), weight 177 lb 7.5 oz (80.5 kg), last menstrual period 08/22/2015, SpO2 94 %. Physical Exam  Constitutional: She appears well-developed. No distress.  GI: Soft. She exhibits no distension. There is no tenderness.    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   No obvious peritoneal carcinomatosis on imaging  >aggree with efforts to establish [name] tissue diagnosis; biopsies  planned of the pulmonary lesion and periaortic lymph node >will check tumor markers >will continue to follow along with you  JACKSON-MOORE,Monique [name] 08/31/2015, 1:52 PM

## 2015-08-31 NOTE — Progress Notes (Signed)
PCCM PROGRESS NOTE  ADMISSION DATE: 08/20/2015 CONSULT DATE: 08/30/2015 REFERRING PROVIDER: Dr. Roel Cluck, [facility]  CC: Short of breath  SUBJECTIVE: C/o diarrhea and bloating.  Has some wheezing  VITAL SIGNS: BP 142/88 mmHg  Pulse 134  Temp(Src) 98.4 F (36.9 C) (Oral)  Resp 35  Ht 5' 5.5" (1.664 m)  Wt 177 lb 7.5 oz (80.5 kg)  BMI 29.07 kg/m2  SpO2 92%  LMP 09/10/2015  INTAKE/OUTPUT: I/O last 3 completed shifts: In: 2515 [I.V.:140; Other:1000; IV Piggyback:1375] Out: -   General: alert HEENT: no sinus tenderness Cardiac: regular, tachycardic Chest: b/l wheeze, rales Rt > Lt base Abd: bloated, soft, non tender Ext: no edema Neuro: normal strength Skin: no rashes   CMP Latest Ref Rng 08/30/2015 08/23/2015  Glucose 65 - 99 mg/dL 93 119(H)  BUN 6 - 20 mg/dL 15 15  Creatinine 0.44 - 1.00 mg/dL 0.70 0.80  Sodium 135 - 145 mmol/L 132(L) 136  Potassium 3.5 - 5.1 mmol/L 4.2 4.1  Chloride 101 - 111 mmol/L 97(L) 100(L)  CO2 22 - 32 mmol/L 22 22  Calcium 8.9 - 10.3 mg/dL 8.9 9.7  Total Protein 6.5 - 8.1 g/dL 6.7 7.4  Total Bilirubin 0.3 - 1.2 mg/dL 1.4(H) 1.2  Alkaline Phos 38 - 126 U/L 74 70  AST 15 - 41 U/L 63(H) 68(H)  ALT 14 - 54 U/L 15 16     CBC Latest Ref Rng 08/30/2015 08/25/2015 08/02/2008  WBC 4.0 - 10.5 K/uL 17.4(H) 16.9(H) 12.3(H)  Hemoglobin 12.0 - 15.0 g/dL 12.3 13.0 10.4(L)  Hematocrit 36.0 - 46.0 % 37.8 39.3 30.9(L)  Platelets 150 - 400 K/uL 387 461(H) 184     Dg Chest 2 View  09/02/2015  CLINICAL DATA:  Shortness of breath. Pneumonia over the past 4 days. EXAM: CHEST  2 VIEW COMPARISON:  08/26/2015 FINDINGS: Worsening patchy bilateral airspace opacities most confluent in the right middle lobe. The these have a nodular component if and underlying malignancy, fungal disease, or fulminant tuberculosis cannot be excluded particularly if the patient has immunosuppression. Right lower paratracheal density may be incidental but could be from adenopathy. There is  fullness of the left hilum. IMPRESSION: 1. Bilateral airspace opacities are worsened, with underlying interstitial accentuation and vague nodularity of these opacities. Although multilobar bacterial pneumonia is a for possibility, other possibilities might include metastatic disease to the lungs, fungal disease, fulminant tuberculosis, or pulmonary hemorrhage. Correlate with the patient's symptoms. Chest CT to be utilized for further characterization if clinically warranted. Electronically Signed   By: Van Clines M.D.   On: 08/30/2015 16:59   Ct Angio Chest Pe W/cm &/or Wo Cm  08/20/2015  CLINICAL DATA:  Patient states ill since Christmas, diagnosed with pneumonia 4 days ago on antibiotics with increase shortness of breath this morning. EXAM: CT ANGIOGRAPHY CHEST WITH CONTRAST TECHNIQUE: Multidetector CT imaging of the chest was performed using the standard protocol during bolus administration of intravenous contrast. Multiplanar CT image reconstructions and MIPs were obtained to evaluate the vascular anatomy. CONTRAST:  124m OMNIPAQUE IOHEXOL 350 MG/ML SOLN COMPARISON:  Chest x-ray 08/26/2015 and 08/26/2015 FINDINGS: Lungs are adequately inflated demonstrate multiple bilateral discrete nodules and nodular regions of airspace opacification. There is masslike consolidation over the right midlung continuous with soft tissue density over the right hilum and subcarinal region. There is no pleural effusion. There is extensive mediastinal and bilateral hilar adenopathy. Subcarinal adenopathy measures 3.2 cm by short axis. A large right peritracheal node measures 2.7 cm by short axis. Adenopathy  extends into the superior mediastinum and neck base at the level of the thyroid gland bilateral. Findings are concerning for central right lung neoplasm with metastatic disease. Adenopathy causes narrowing of the distal aspect of the main pulmonary arteries bilaterally. There is associated airway obstruction over the  central right middle lobe. Heart is normal in size. No definite evidence of pulmonary embolism. No axillary adenopathy. Images through the upper abdomen demonstrate a few small right pericardial phrenic lymph nodes. Adenopathy adjacent the gastroesophageal junction with lymph node measuring 1.8 cm by short axis. There are degenerative changes of the spine. Review of the MIP images confirms the above findings. IMPRESSION: Large central right middle lobe lung mass without clear borders likely with mild associated postobstructive atelectasis. Bulky mediastinal and bilateral hilar adenopathy as described. Multiple bilateral discrete pulmonary nodules and nodular areas of airspace opacification likely metastatic disease. Right lung mass obstructs the right middle lobe bronchus. Adenopathy causes narrowing of the main pulmonary arteries. Adenopathy over the upper abdomen near the gastroesophageal junction likely metastatic disease. Recommend tissue diagnosis. No evidence of pulmonary embolism. Electronically Signed   By: Marin Olp M.D.   On: 09/01/2015 20:38   Ct Abdomen Pelvis W Contrast  08/30/2015  CLINICAL DATA:  Pulmonary mass and bilateral metastatic nodules. EXAM: CT ABDOMEN AND PELVIS WITH CONTRAST TECHNIQUE: Multidetector CT imaging of the abdomen and pelvis was performed using the standard protocol following bolus administration of intravenous contrast. CONTRAST:  56m OMNIPAQUE IOHEXOL 300 MG/ML SOLN, 1060mOMNIPAQUE IOHEXOL 300 MG/ML SOLN COMPARISON:  Chest CT 08/25/2015 FINDINGS: Lower chest: Mass in the rib RIGHT lower lobe/RIGHT middle lobe is again demonstrated. Multiple bilateral pulmonary nodules. Hepatobiliary: No focal hepatic lesion. No biliary duct dilatation. Gallbladder is normal. Common bile duct is normal. Pancreas: Pancreas is normal. No ductal dilatation. No pancreatic inflammation. Spleen: Normal spleen Adrenals/urinary tract: Adrenal glands and kidneys are normal. The ureters and  bladder normal. Stomach/Bowel: Stomach, small bowel, appendix, and cecum are normal. The colon and rectosigmoid colon are normal. Vascular/Lymphatic: Abdominal aorta normal caliber. Gastrohepatic ligament and periaortic retroperitoneal adenopathy noted. Example gastrohepatic lymph node measures 19 mm (image 14, series 2). LEFT periaortic lymph node measures 1.9 cm (image 28, series 2). Reproductive: There is a large mass within the central pelvis superior to the uterus measuring 10 cm by 9.5 cm (image 58, series 2). This mass positioned between the ovaries. RIGHT ovary appears normal. LEFT ovary is not as well-defined. Uterus appears normal. Other: Free fluid the pelvis. Musculoskeletal: No aggressive osseous lesion. IMPRESSION: 1. Large mass superior to the uterus and position between the ovaries measuring up to 10 cm. Differential includes metastasis from potential bronchogenic carcinoma versus a PRIMARY OVARIAN MALIGNANCY. Lymphoma could also be considered. 2. Retroperitoneal and gastrohepatic ligament metastatic adenopathy. 3. 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 Differential would include primary bronchogenic carcinoma including SMALL CELL LUNG CARCINOMA. Electronically Signed   By: StSuzy Bouchard.D.   On: 08/30/2015 19:15     CULTURES: 1/13 Blood >> 1/14 Pneumococcal Ag >> negative 1/14 Legionella Ag >>  1/15 C diff PCR >>   ANTIBIOTICS: 1/13 Rocephin >> 1/13 1/13 Zithromax >> 1/13 1/14 Vancomycin >> 1/14 Zosyn >>   STUDIES: 1/13 CT chest >> multiple b/l nodules, mass like consolidation Rt mid lung, Rt hilar and subcarinal mass, Lt hilar LAN, 2.7 cm Rt paratracheal LAN 1/14 CT abd/pelvis >> 10 cm mass superior to uterus  EVENTS: 1/13 Admit  DISCUSSION: [age]o female with progressive dyspnea, wheezing.  She was found to have abnormal CT chest/abdomen/pelvis concerning for metastatic cancer with ?of lung versus Gyn primary.  ASSESSMENT/PLAN:  Metastatic  cancer. Plan: - d/w IR about whether it would be better to try doing biopsy of abdominal mass first >> if not, then will arrange for bronchoscopy - primary team to order tumor markers - okay to resume diet for now  Post-obstructive pneumonia Rt lung. Plan: - Day 3 of Abx per primary team  Diarrhea. Plan: - f/u C diff PCR  D/w Dr. Charlies Silvers.  Updated pt's family at bedside.  Chesley Mires, MD [facility] Pulmonary/Critical Care 08/31/2015, 9:08 AM Pager:  [phone] After 3pm call: [phone]

## 2015-08-31 NOTE — Progress Notes (Signed)
Pharmacy Antibiotic Follow-up Note  Monique [name] is a 40 y.o. year-old female admitted on 09/04/2015.  The patient is currently on day 2 of Vancomycin 1g IV q8h and Zosyn 3.375g IV q8h (4 hour infusion time) for post-obstructive pneumonia.  Failed outpatient treatments with azithromycin, levofloxacin, clarithromycin in past month.  Assessment/Plan: This patient's current antibiotics will be continued without adjustments.  Obtain vancomycin trough this evening and may adjust dose based on level.   Vancomycin trough on 1/15 is 15 mcg/ml. Continue current dosing of 1000 mg IV q8h.  Temp (24hrs), Avg:98.9 F (37.2 C), Min:98.4 F (36.9 C), Max:99.2 F (37.3 C)   Recent Labs Lab 08/28/2015 1750 08/30/15 1826  WBC 16.9* 17.4*     Recent Labs Lab 09/01/2015 1750 08/30/15 1826  CREATININE 0.80 0.70   Estimated Creatinine Clearance: 99 mL/min (by C-G formula based on Cr of 0.7).    No Known Allergies  Antimicrobials this admission: 1/14 >> vancomycin >> 1/14 >> Zosyn >>   Levels/dose changes this admission: 1/15 1900 VT: 15 on 1g IV q8h  Microbiology results: 1/13 blood x 2 ngtd < 24 h HIV screen neg Strep pneumo ur ag neg MRSA neg Cdiff sent  Thank you for allowing pharmacy to be a part of this patient's care.  Royetta Asal, PharmD, BCPS Pager [phone] 08/31/2015 8:12 PM

## 2015-09-01 ENCOUNTER — Inpatient Hospital Stay (HOSPITAL_COMMUNITY): Payer: BC Managed Care – PPO

## 2015-09-01 DIAGNOSIS — J189 Pneumonia, unspecified organism: Secondary | ICD-10-CM

## 2015-09-01 DIAGNOSIS — R0902 Hypoxemia: Secondary | ICD-10-CM

## 2015-09-01 DIAGNOSIS — R062 Wheezing: Secondary | ICD-10-CM

## 2015-09-01 DIAGNOSIS — N858 Other specified noninflammatory disorders of uterus: Secondary | ICD-10-CM | POA: Insufficient documentation

## 2015-09-01 DIAGNOSIS — R918 Other nonspecific abnormal finding of lung field: Secondary | ICD-10-CM

## 2015-09-01 DIAGNOSIS — J9601 Acute respiratory failure with hypoxia: Secondary | ICD-10-CM

## 2015-09-01 LAB — LEGIONELLA ANTIGEN, URINE

## 2015-09-01 LAB — CA 125: CA 125: 780.8 U/mL — ABNORMAL HIGH (ref 0.0–38.1)

## 2015-09-01 LAB — CANCER ANTIGEN 19-9: CA 19-9: 453 U/mL — ABNORMAL HIGH (ref 0–35)

## 2015-09-01 LAB — PREGNANCY, URINE: PREG TEST UR: NEGATIVE

## 2015-09-01 LAB — CEA: CEA: 3.9 ng/mL (ref 0.0–4.7)

## 2015-09-01 MED ORDER — BUDESONIDE 0.5 MG/2ML IN SUSP
0.5000 mg | Freq: Two times a day (BID) | RESPIRATORY_TRACT | Status: DC
  Administered 2015-09-01 – 2015-09-26 (×51): 0.5 mg via RESPIRATORY_TRACT
  Filled 2015-09-01 (×52): qty 2

## 2015-09-01 MED ORDER — DEXMEDETOMIDINE HCL IN NACL 200 MCG/50ML IV SOLN
0.2000 ug/kg/h | INTRAVENOUS | Status: DC
  Administered 2015-09-01: 0.2 ug/kg/h via INTRAVENOUS
  Filled 2015-09-01: qty 50

## 2015-09-01 MED ORDER — FENTANYL CITRATE (PF) 100 MCG/2ML IJ SOLN
200.0000 ug | Freq: Once | INTRAMUSCULAR | Status: AC
  Administered 2015-09-01: 125 ug via INTRAVENOUS
  Filled 2015-09-01: qty 4

## 2015-09-01 MED ORDER — LEVALBUTEROL HCL 1.25 MG/0.5ML IN NEBU
1.2500 mg | INHALATION_SOLUTION | RESPIRATORY_TRACT | Status: DC | PRN
  Administered 2015-09-01 – 2015-09-24 (×4): 1.25 mg via RESPIRATORY_TRACT
  Filled 2015-09-01 (×6): qty 0.5

## 2015-09-01 MED ORDER — LEVALBUTEROL HCL 0.63 MG/3ML IN NEBU
0.6300 mg | INHALATION_SOLUTION | Freq: Four times a day (QID) | RESPIRATORY_TRACT | Status: DC
  Administered 2015-09-01 – 2015-09-04 (×11): 0.63 mg via RESPIRATORY_TRACT
  Filled 2015-09-01 (×11): qty 3

## 2015-09-01 MED ORDER — LIDOCAINE HCL 1 % IJ SOLN
6.0000 mL | Freq: Once | INTRAMUSCULAR | Status: AC
  Administered 2015-09-01: 6 mL via RESPIRATORY_TRACT

## 2015-09-01 MED ORDER — PHENYLEPHRINE HCL 0.25 % NA SOLN
1.0000 | Freq: Four times a day (QID) | NASAL | Status: DC | PRN
  Administered 2015-09-01: 1 via NASAL
  Filled 2015-09-01: qty 15

## 2015-09-01 MED ORDER — MIDAZOLAM HCL 2 MG/2ML IJ SOLN
INTRAMUSCULAR | Status: AC
  Administered 2015-09-01: 4 mg via INTRAVENOUS
  Filled 2015-09-01: qty 6

## 2015-09-01 MED ORDER — LIDOCAINE HCL 2 % EX GEL
1.0000 "application " | Freq: Once | CUTANEOUS | Status: AC
  Administered 2015-09-01: 1
  Filled 2015-09-01: qty 5

## 2015-09-01 MED ORDER — MIDAZOLAM HCL 2 MG/2ML IJ SOLN
4.0000 mg | Freq: Once | INTRAMUSCULAR | Status: AC
  Administered 2015-09-01: 4 mg via INTRAVENOUS
  Filled 2015-09-01: qty 4

## 2015-09-01 NOTE — Procedures (Signed)
Bedside Bronchoscopy Procedure Note KATIEJO GILROY [id number] Nov 29, 1975  Procedure: Bronchoscopy Indications: Obtain specimens for culture and/or other diagnostic studies  Procedure Details: Bite block in place: No, bronchoscope entered through nose In preparation for procedure, Patient hyper-oxygenated with 100 % FiO2 NRB mask Airway entered and the following bronchi were examined: RUL, RML, RLL, LUL, LLL and Bronchi.   Bronchoscope removed.  , Patient remained on 100% NRB throughout procedure.    Evaluation BP 161/79 mmHg  Pulse 134  Temp(Src) 98.4 F (36.9 C) (Oral)  Resp 34  Ht 5' 5.5" (1.664 m)  Wt 177 lb 7.5 oz (80.5 kg)  BMI 29.07 kg/m2  SpO2 97%  LMP 08/28/2015 Breath Sounds:Expiratory wheezes O2 sats: stable throughout Patient's Current Condition: stable Specimens: bronchial brushings, bronchial biopsies, needle biopsies, bronchial washings Complications: No apparent complications Patient did tolerate procedure well.   Kathie Dike 09/01/2015, 3:13 PM

## 2015-09-01 NOTE — Procedures (Signed)
Video Bronchoscopy Procedure Note  Date of Operation: 09/01/2015  Pre-op Diagnosis: Bilateral lung masses, mediastinal lymphadenopathy  Post-op Diagnosis: Same  Surgeon: Baltazar Apo  Assistants: Noe Gens   Anesthesia: conscious sedation, moderate sedation  Meds Given: fentanyl 198mg, versed '4mg'$  in divided doses, Precedex infusion titrated up to 0.9,  1% lidocaine 20cc total  Operation: Flexible video fiberoptic bronchoscopy and biopsies.  Estimated Blood Loss: 168GS Complications: none noted  Indications and History: Monique KIRSTENis 40y.o. with history of a newly discovered pelvic mass and multiple round metastatic lesions bilaterally in the lungs, the largest taking up multiple segments of the RML. Also noted was widespread mediastinal LAD particularly at SDyess 4L and 7.  Recommendation was to perform video fiberoptic bronchoscopy with biopsies. The risks, benefits, complications, treatment options and expected outcomes were discussed with the patient.  The possibilities of pneumothorax, pneumonia, reaction to medication, pulmonary aspiration, perforation of a viscus, bleeding, failure to diagnose a condition and creating a complication requiring transfusion or operation were discussed with the patient who freely signed the consent.    Description of Procedure: [facility] was examined and was deemed appropriate to proceed.  The patient was identified as Monique ELIZONDOand the procedure verified as Flexible Video Fiberoptic Bronchoscopy.  A Time Out was held and the above information confirmed.   Conscious sedation was initiated as indicated above. The video fiberoptic bronchoscope was introduced via the L nare and a general inspection was performed which showed cobblestoning of the posterior pharynx without any obvious mass. There were normal false and true cords. The trachea was entered and appeared normal, terminating in a slightly  splayed main carina. The R sided airways were inspected and were diffusely erythematous, narrowed. All lobar bronchi were narrowed. The RUL bronchus was passable and revealed a small exophytic endobronchial lesion in the anterior segment that was brushed and biopsied. The RLL bronchus was similarly narrow but all segmental airways were visible. The RML was narrowed and had an apparent endobronchial lesion occluding it. The scope would not pass into the RML. Endobronchial brushings and biopsies were obtained at the RML. The L sided exam was significant for narrow erythematous airways but there were no endobronchial lesions seen. Wang needle biopsies were obtained from the station 7 node and were sent for cytology. Finally bronchial washings were obtained from the RUL to be sent for cytology. The patient tolerated the procedure well. The bronchoscope was removed. There were no obvious complications.   Samples: 1. Endobronchial brushings from RUL and RML bronchi 2. Wang needle biopsies from Station 7 node 3. Endobronchial forceps biopsies from RUL and RML bronchi 4. Bronchial washings from RUL  Plans:  We will review the cytology, pathology results with the patient when they become available.     RBaltazar Apo MD, PhD 09/01/2015, 3:04 PM [facility] [phone] if no answer [phone]

## 2015-09-01 NOTE — Progress Notes (Signed)
PCCM PROGRESS NOTE  ADMISSION DATE: 08/18/2015 CONSULT DATE: 08/30/2015 REFERRING PROVIDER: Dr. Roel Cluck, [facility]  CC: Short of breath  SUBJECTIVE: Breathing better after getting nebulizer treatment.  Still feels short of breath.  VITAL SIGNS: BP 147/87 mmHg  Pulse 138  Temp(Src) 98.2 F (36.8 C) (Oral)  Resp 27  Ht 5' 5.5" (1.664 m)  Wt 177 lb 7.5 oz (80.5 kg)  BMI 29.07 kg/m2  SpO2 92%  LMP 09/14/2015  INTAKE/OUTPUT: I/O last 3 completed shifts: In: 4185 [P.O.:960; I.V.:350; Other:1000; IV Piggyback:1875] Out: -   General: alert HEENT: no sinus tenderness Cardiac: regular, tachycardic Chest: faint b/l wheeze, poor air movement Abd: bloated, soft, non tender Ext: no edema Neuro: normal strength Skin: no rashes   CMP Latest Ref Rng 08/30/2015 08/31/2015  Glucose 65 - 99 mg/dL 93 119(H)  BUN 6 - 20 mg/dL 15 15  Creatinine 0.44 - 1.00 mg/dL 0.70 0.80  Sodium 135 - 145 mmol/L 132(L) 136  Potassium 3.5 - 5.1 mmol/L 4.2 4.1  Chloride 101 - 111 mmol/L 97(L) 100(L)  CO2 22 - 32 mmol/L 22 22  Calcium 8.9 - 10.3 mg/dL 8.9 9.7  Total Protein 6.5 - 8.1 g/dL 6.7 7.4  Total Bilirubin 0.3 - 1.2 mg/dL 1.4(H) 1.2  Alkaline Phos 38 - 126 U/L 74 70  AST 15 - 41 U/L 63(H) 68(H)  ALT 14 - 54 U/L 15 16     CBC Latest Ref Rng 08/30/2015 09/10/2015 08/02/2008  WBC 4.0 - 10.5 K/uL 17.4(H) 16.9(H) 12.3(H)  Hemoglobin 12.0 - 15.0 g/dL 12.3 13.0 10.4(L)  Hematocrit 36.0 - 46.0 % 37.8 39.3 30.9(L)  Platelets 150 - 400 K/uL 387 461(H) 184     Ct Abdomen Pelvis W Contrast  08/30/2015  CLINICAL DATA:  Pulmonary mass and bilateral metastatic nodules. EXAM: CT ABDOMEN AND PELVIS WITH CONTRAST TECHNIQUE: Multidetector CT imaging of the abdomen and pelvis was performed using the standard protocol following bolus administration of intravenous contrast. CONTRAST:  34m OMNIPAQUE IOHEXOL 300 MG/ML SOLN, 1050mOMNIPAQUE IOHEXOL 300 MG/ML SOLN COMPARISON:  Chest CT 08/28/2015 FINDINGS: Lower chest:  Mass in the rib RIGHT lower lobe/RIGHT middle lobe is again demonstrated. Multiple bilateral pulmonary nodules. Hepatobiliary: No focal hepatic lesion. No biliary duct dilatation. Gallbladder is normal. Common bile duct is normal. Pancreas: Pancreas is normal. No ductal dilatation. No pancreatic inflammation. Spleen: Normal spleen Adrenals/urinary tract: Adrenal glands and kidneys are normal. The ureters and bladder normal. Stomach/Bowel: Stomach, small bowel, appendix, and cecum are normal. The colon and rectosigmoid colon are normal. Vascular/Lymphatic: Abdominal aorta normal caliber. Gastrohepatic ligament and periaortic retroperitoneal adenopathy noted. Example gastrohepatic lymph node measures 19 mm (image 14, series 2). LEFT periaortic lymph node measures 1.9 cm (image 28, series 2). Reproductive: There is a large mass within the central pelvis superior to the uterus measuring 10 cm by 9.5 cm (image 58, series 2). This mass positioned between the ovaries. RIGHT ovary appears normal. LEFT ovary is not as well-defined. Uterus appears normal. Other: Free fluid the pelvis. Musculoskeletal: No aggressive osseous lesion. IMPRESSION: 1. Large mass superior to the uterus and position between the ovaries measuring up to 10 cm. Differential includes metastasis from potential bronchogenic carcinoma versus a PRIMARY OVARIAN MALIGNANCY. Lymphoma could also be considered. 2. Retroperitoneal and gastrohepatic ligament metastatic adenopathy. 3. 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 Differential would include primary bronchogenic carcinoma including SMALL CELL LUNG CARCINOMA. Electronically Signed   By: StSuzy Bouchard.D.   On: 08/30/2015 19:15  CULTURES: 1/13 Blood >> 1/14 Pneumococcal Ag >> negative 1/14 Legionella Ag >>  1/15 C diff PCR >> negative  ANTIBIOTICS: 1/13 Rocephin >> 1/13 1/13 Zithromax >> 1/13 1/14 Vancomycin >> 1/14 Zosyn >>   LINES/TUBES: 1/15 Rt PICC >>    STUDIES: 1/13 CT chest >> multiple b/l nodules, mass like consolidation Rt mid lung, Rt hilar and subcarinal mass, Lt hilar LAN, 2.7 cm Rt paratracheal LAN 1/14 CT abd/pelvis >> 10 cm mass superior to uterus 1/15 Tumor markers >>   EVENTS: 1/13 Admit 1/15 Gyn oncology consulted  DISCUSSION: 40 yo female with progressive dyspnea, wheezing.  She was found to have abnormal CT chest/abdomen/pelvis concerning for metastatic cancer with ?of lung versus Gyn primary.  ASSESSMENT/PLAN:  Metastatic cancer. Plan: - needs tissue sampling >> IR concerned about seeding track with Bx, Gyn-oncology concerned about 2 separate primaries possibilty - will therefore need to arrange for bronchoscopy  - f/u tumor markers   Post-obstructive pneumonia Rt lung. Wheezing. Plan: - Day 4 of Abx per primary team - continue xopenex, add pulmicort >> d/c symbicort  Acute hypoxic respiratory failure. Plan: - oxygen to keep SpO2 > 92%  SUMMARY: Will try to arrange for bronchoscopy this afternoon.  Will try to do this in ICU at bedside >> will be able to use precedex for sedation, and therefore minimize benzo's/opiates.  Procedure explained to pt.  Alternatives discussed.  Risks detailed as bleeding, pneumothorax, infection, respiratory failure, and non diagnosis.  If procedure can be done this afternoon, then will be done by Dr. Lamonte Sakai.  If needs to be scheduled for tomorrow, then I will do procedure.  Updated pt's family at bedside.  Chesley Mires, MD [facility] Pulmonary/Critical Care 09/01/2015, 9:40 AM Pager:  [phone] After 3pm call: [phone]

## 2015-09-01 NOTE — Progress Notes (Signed)
eLink Physician-Brief Progress Note Patient Name: Monique [name] DOB: 02-25-1976 MRN: [id number]   Date of Service  09/01/2015  HPI/Events of Note  RN contacted regarding hypertension, tachycardia & bilateral wheezing. Camera chk shows patient sitting up & appears comfortable. Speaking in complete sentences. Nurse notes bilateral wheezing right > left. Wheezing noted by Dr. Halford Chessman earlier today & suspect secondary to post-obstructive pneumonia. Tachycardia is sinus on telemetry.   eICU Interventions  1. Adding Flutter valve q4hr while awake 2. Continue nebs & antibiotics as ordered 3. Holding off on steroid therapy at this time 4. Giving Hydralazine 5. Monitoring rhythm on telemetry     Intervention Category Intermediate Interventions: Respiratory distress - evaluation and management  Tera Partridge 09/01/2015, 1:13 AM

## 2015-09-01 NOTE — Progress Notes (Signed)
Patient ID: Monique [name], female   DOB: 10/10/1975, 40 y.o.   MRN: [id number] [facility] PROGRESS NOTE  Monique [name] VEH:[id number] DOB: 1976-01-04 DOA: 09/13/2015 PCP: No primary care provider on file.  Brief narrative:    40 year old female with no significant past medical history, has family history of lymphoma in her mother (succesfully treated), patient husband smokes but not in household and pt does not smoke. She first presented to PCP 12/19 because of shortness of breath and was given zpack. She got better initially but then started to get short of breath. She went to PCP and was given albuterol inhaler, got better for a short time and then again shortness of breath started. She went back to PCP and was given Levaquin and steroids which helped for few days but then she again got short of breath and came to ED for further evaluation.   In ED, CXR and CT chest with findings concerning for malignancy. She has large central right middle lobe lung mass associated with postobstructive atelectasis, bulky mediastinal and bilateral hilar adenopathy, multiple bilateral discrete pulmonary nodules and nodular areas of airspace opacification likely representing metastatic disease, right lung mass obstructing the right middle lobe bronchus, adenopathy causing the narrowing of the main pulmonary arteries. Further imaging of the abdomen area points towards malignancy. She has a large mass superior to the uterus positioned between the ovaries and measures about 10 cm with a differential including metastatic disease from bronchogenic carcinoma or primary ovarian carcinoma and possible lymphoma. She also has retroperitoneal and gastrohepatic ligament metastatic adenopathy.  She was seen by GYN oncology, not recommended to biopsy ovaries. Plan fro bronch today or tomorrow.   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 - Plan for bronchoscopy for tissue diagnoses - Pulmonary and gyn onc on board - Continue vanco and zosyn - Blood cultures negative, strep pneumonia neg, legionella negative, c.diff negative  - Continue oxygen support via nasal cannula to keep oxygen saturation above 90%   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  Family Communication:  plan of care discussed with the patient and husband at the bedside  Disposition Plan: Patient is acutely ill, unclear at this point when she will be stable for discharge  IV access:  Peripheral IV  Procedures and diagnostic studies:    Ct Abdomen Pelvis W Contrast 08/30/2015  1. Large mass superior to the uterus and position between the ovaries measuring up to 10 cm. Differential includes metastasis from potential bronchogenic carcinoma versus a PRIMARY OVARIAN MALIGNANCY. Lymphoma could also be considered. 2. Retroperitoneal and gastrohepatic ligament metastatic adenopathy. 3. 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 Differential would include primary bronchogenic carcinoma including SMALL CELL LUNG CARCINOMA. Electronically Signed   By: Suzy Bouchard M.D.   On: 08/30/2015 19:15   Dg Chest 2 View 08/23/2015   1. Bilateral airspace  opacities are worsened, with underlying interstitial accentuation and vague nodularity of these opacities. Although  multilobar bacterial pneumonia is a for possibility, other possibilities might include metastatic disease to the lungs, fungal disease, fulminant tuberculosis, or pulmonary hemorrhage. Correlate with the patient's symptoms. Chest CT to be utilized for further characterization if clinically warranted. Electronically Signed   By: Van Clines M.D.   On: 08/31/2015 16:59   Dg Chest 2 View 08/26/2015  Multifocal alveolar opacities bilaterally most compatible with pneumonia. The right middle lobe appears the most significantly involved. These results will be called to the ordering clinician or representative by the Radiologist Assistant, and communication documented in the PACS or zVision Dashboard. Electronically Signed   By: David  Martinique M.D.   On: 08/26/2015 12:31   Ct Angio Chest Pe W/cm &/or Wo Cm 08/28/2015  Large central right middle lobe lung mass without clear borders likely with mild associated postobstructive atelectasis. Bulky mediastinal and bilateral hilar adenopathy as described. Multiple bilateral discrete pulmonary nodules and nodular areas of airspace opacification likely metastatic disease. Right lung mass obstructs the right middle lobe bronchus. Adenopathy causes narrowing of the main pulmonary arteries. Adenopathy over the upper abdomen near the gastroesophageal junction likely metastatic disease. Recommend tissue diagnosis. No evidence of pulmonary embolism. Electronically Signed   By: Marin Olp M.D.   On: 08/24/2015 20:38    Medical Consultants:  Pulmonary  GYN ON - Dr. Delsa Sale Oncology, Dr. Alen Blew IR  Other Consultants:  None   IAnti-Infectives:   Azithromycin and rocephin 1/13 Vanco and zosyn 08/30/2015 -->    Leisa Lenz, MD  [facility] Pager [phone]  Time spent in minutes: 25 minutes  If 7PM-7AM, please contact night-coverage [email or web address] Password [facility] 09/01/2015, 8:41 AM   LOS: 2 days    HPI/Subjective: No acute overnight events.  Patient still short of breath.  Objective: Filed Vitals:   09/01/15 0500 09/01/15 0600 09/01/15 0645 09/01/15 0800  BP:  148/86  147/87  Pulse: 126 139  138  Temp:    98.2 F (36.8 C)  TempSrc:    Oral  Resp: 32 28  27  Height:      Weight:      SpO2: 94% 93% 93% 92%    Intake/Output Summary (Last 24 hours) at 09/01/15 0841 Last data filed at 09/01/15 8338  Gross per 24 hour  Intake   1940 ml  Output      0 ml  Net   1940 ml    Exam:   General:  Pt is short of breath  Cardiovascular: Tachycardic, S1/S2 appreciated   Respiratory: diminished, wheezing throughout   Abdomen: (+) BS, non tender   Extremities: No edema, palpable pulses   Neuro: No focal deficits   Data Reviewed: Basic Metabolic Panel:  Recent Labs Lab 08/22/2015 1750 08/30/15 1826  NA 136 132*  K 4.1 4.2  CL 100* 97*  CO2 22 22  GLUCOSE 119* 93  BUN 15 15  CREATININE 0.80 0.70  CALCIUM 9.7 8.9  MG  --  2.2  PHOS  --  2.7   Liver Function Tests:  Recent Labs Lab 08/20/2015 1750 08/30/15 1826  AST 68* 63*  ALT 16 15  ALKPHOS 70 74  BILITOT 1.2 1.4*  PROT 7.4 6.7  ALBUMIN 3.6 3.3*   No results for input(s): LIPASE, AMYLASE in the last 168 hours. No results for input(s): AMMONIA in the last 168 hours. CBC:  Recent Labs Lab 08/18/2015 1750 08/30/15 1826  WBC  16.9* 17.4*  NEUTROABS 14.1*  --   HGB 13.0 12.3  HCT 39.3 37.8  MCV 82.7 82.7  PLT 461* 387   Cardiac Enzymes: No results for input(s): CKTOTAL, CKMB, CKMBINDEX, TROPONINI in the last 168 hours. BNP: Invalid input(s): POCBNP CBG: No results for input(s): GLUCAP in the last 168 hours.  Recent Results (from the past 240 hour(s))  Culture, blood (Routine X 2) w Reflex to ID Panel     Status: None (Preliminary result)   Collection Time: 09/14/2015  5:40 PM  Result Value Ref Range Status   Specimen Description BLOOD LEFT ANTECUBITAL  Final   Special Requests BOTTLES DRAWN AEROBIC ONLY [place]  Final   Culture   Final    NO  GROWTH 2 DAYS Performed at [facility]    Report Status PENDING  Incomplete  Culture, blood (Routine X 2) w Reflex to ID Panel     Status: None (Preliminary result)   Collection Time: 08/24/2015  5:45 PM  Result Value Ref Range Status   Specimen Description BLOOD LEFT ANTECUBITAL  Final   Special Requests BOTTLES DRAWN AEROBIC AND ANAEROBIC 6CC  Final   Culture   Final    NO GROWTH 2 DAYS Performed at [facility]    Report Status PENDING  Incomplete  MRSA PCR Screening     Status: None   Collection Time: 08/30/15  2:30 PM  Result Value Ref Range Status   MRSA by PCR NEGATIVE NEGATIVE Final  C difficile quick scan w PCR reflex     Status: None   Collection Time: 08/31/15 12:38 PM  Result Value Ref Range Status   C Diff antigen NEGATIVE NEGATIVE Final   C Diff toxin NEGATIVE NEGATIVE Final   C Diff interpretation Negative for toxigenic C. difficile  Final     Scheduled Meds: . budesonide-formoterol  2 puff Inhalation BID  . guaiFENesin  600 mg Oral BID  . levalbuterol  0.63 mg Nebulization Q4H  . piperacillin-tazobactam (ZOSYN)  IV  3.375 g Intravenous Q8H  . vancomycin  1,000 mg Intravenous Q8H   Continuous Infusions: . sodium chloride 10 mL/hr at 08/30/15 2000

## 2015-09-02 DIAGNOSIS — R0902 Hypoxemia: Secondary | ICD-10-CM | POA: Insufficient documentation

## 2015-09-02 DIAGNOSIS — C7800 Secondary malignant neoplasm of unspecified lung: Secondary | ICD-10-CM

## 2015-09-02 DIAGNOSIS — C569 Malignant neoplasm of unspecified ovary: Secondary | ICD-10-CM

## 2015-09-02 DIAGNOSIS — C562 Malignant neoplasm of left ovary: Secondary | ICD-10-CM

## 2015-09-02 LAB — COMPREHENSIVE METABOLIC PANEL
ALT: 16 U/L (ref 14–54)
ANION GAP: 13 (ref 5–15)
AST: 63 U/L — ABNORMAL HIGH (ref 15–41)
Albumin: 2.7 g/dL — ABNORMAL LOW (ref 3.5–5.0)
Alkaline Phosphatase: 82 U/L (ref 38–126)
BUN: 17 mg/dL (ref 6–20)
CHLORIDE: 95 mmol/L — AB (ref 101–111)
CO2: 27 mmol/L (ref 22–32)
CREATININE: 0.69 mg/dL (ref 0.44–1.00)
Calcium: 9.2 mg/dL (ref 8.9–10.3)
GFR calc non Af Amer: >60 mL/min (ref >60)
Glucose, Bld: 123 mg/dL — ABNORMAL HIGH (ref 65–99)
Potassium: 3.5 mmol/L (ref 3.5–5.1)
SODIUM: 135 mmol/L (ref 135–145)
Total Bilirubin: 1.1 mg/dL (ref 0.3–1.2)
Total Protein: 6.7 g/dL (ref 6.5–8.1)

## 2015-09-02 LAB — CBC WITH DIFFERENTIAL/PLATELET
Basophils Absolute: 0 10*3/uL (ref 0.0–0.1)
Basophils Relative: 0 %
Eosinophils Absolute: 0 10*3/uL (ref 0.0–0.7)
Eosinophils Relative: 0 %
HEMATOCRIT: 33.4 % — AB (ref 36.0–46.0)
HEMOGLOBIN: 10.8 g/dL — AB (ref 12.0–15.0)
LYMPHS ABS: 0.9 10*3/uL (ref 0.7–4.0)
LYMPHS PCT: 5 %
MCH: 27 pg (ref 26.0–34.0)
MCHC: 32.3 g/dL (ref 30.0–36.0)
MCV: 83.5 fL (ref 78.0–100.0)
MONOS PCT: 9 %
Monocytes Absolute: 1.6 10*3/uL — ABNORMAL HIGH (ref 0.1–1.0)
NEUTROS ABS: 16.2 10*3/uL — AB (ref 1.7–7.7)
NEUTROS PCT: 86 %
Platelets: 405 10*3/uL — ABNORMAL HIGH (ref 150–400)
RBC: 4 MIL/uL (ref 3.87–5.11)
RDW: 13.5 % (ref 11.5–15.5)
WBC: 18.7 10*3/uL — ABNORMAL HIGH (ref 4.0–10.5)

## 2015-09-02 MED ORDER — FUROSEMIDE 10 MG/ML IJ SOLN
40.0000 mg | Freq: Four times a day (QID) | INTRAMUSCULAR | Status: AC
  Administered 2015-09-02 (×2): 40 mg via INTRAVENOUS
  Filled 2015-09-02 (×2): qty 4

## 2015-09-02 NOTE — Progress Notes (Signed)
Date: September 02, 2015 Chart reviewed for concurrent status and case management needs. Will continue to follow patient for changes and needs: Velva Harman, RN, BSN, [place]   [phone]

## 2015-09-02 NOTE — Progress Notes (Signed)
[facility]  Called emergently to the bedside to assess for intubation.  Monique [name] chart was reviewed, she is an unfortunate 40 y/o female with widely metastatic cancer newly diagnosed, tissue diagnosis pending.  She was admitted to our hospital for worsening dyspnea and RLL pneumonia. Found to have multiple masses throughout lungs and was treated with broad spectrum antibiotics.  She underwent a bronchoscopy today and was monitored on 100% O2 during the procedure.  Since then she has had progressive hypoxemia, dyspnea, tachypnea, and tachycardia.  I was asked to assess for intubation.  On exam Filed Vitals:   09/01/15 2000 09/01/15 2100 09/01/15 2200 09/01/15 2315  BP: 132/90 176/110 153/127 155/80  Pulse: 147 148 133 149  Temp: 98.8 F (37.1 C)     TempSrc: Axillary     Resp: 31 31 35 35  Height:      Weight:      SpO2: 96% 94% 98% 96%    Intake/Output Summary (Last 24 hours) at 09/02/15 0032 Last data filed at 09/01/15 2300  Gross per 24 hour  Intake   1140 ml  Output      0 ml  Net   1140 ml    Gen: tachypneic, speaking in full sentences, no accessory muscle use or paradoxical breathing HEENT: NCAT OP clear PULM: wheezing bilaterally, crackles R base, increased effort CV: Tachycardia, cannot assess JVD Belly soft, nontender, BS+ Ext: trace edema  CXR : RLL infiltrate, increased interstitial markings  Impression/Plan: Acute respiratory failure with hypoxemia> due to pnemonia but also likely pulmonary edema with exam findings and CXR, will KVO fluids and give lasix x2 doses overnight, BIPAP now, close monitoring in ICU.  With pneumonia likelihood of this getting worse and needing intubation is high, discussed with patient and her husband.  She wants to avoid intubation if possible so will monitor on BIPAP for now  My cc time 40 minutes  Roselie Awkward, MD [place] [facility] Pager: [phone] Cell: [phone] After 3pm or if no response, call [phone]

## 2015-09-02 NOTE — Progress Notes (Signed)
PCCM PROGRESS NOTE  ADMISSION DATE: 08/24/2015 CONSULT DATE: 08/30/2015 REFERRING PROVIDER: Dr. Roel Cluck, [facility]  CC: Short of breath  SUBJECTIVE: Placed on BiPAP overnight.  She feels this has helped her breathing, wheezing.  She was able to sleep for about 3 to 4 hours with BiPAP.  VITAL SIGNS: BP 131/94 mmHg  Pulse 140  Temp(Src) 98.8 F (37.1 C) (Axillary)  Resp 36  Ht 5' 5.5" (1.664 m)  Wt 177 lb 7.5 oz (80.5 kg)  BMI 29.07 kg/m2  SpO2 95%  LMP 08/18/2015  INTAKE/OUTPUT: I/O last 3 completed shifts: In: 2360 [P.O.:720; I.V.:390; IV Piggyback:1250] Out: 2325 [Urine:2325]  General: alert HEENT: wearing BiPAP Cardiac: regular, tachycardic Chest: decreased BS at bases, no wheeze Abd: non tender Ext: no edema Neuro: normal strength Skin: no rashes   CMP Latest Ref Rng 09/02/2015 08/30/2015 09/03/2015  Glucose 65 - 99 mg/dL 123(H) 93 119(H)  BUN 6 - 20 mg/dL '17 15 15  '$ Creatinine 0.44 - 1.00 mg/dL 0.69 0.70 0.80  Sodium 135 - 145 mmol/L 135 132(L) 136  Potassium 3.5 - 5.1 mmol/L 3.5 4.2 4.1  Chloride 101 - 111 mmol/L 95(L) 97(L) 100(L)  CO2 22 - 32 mmol/L '27 22 22  '$ Calcium 8.9 - 10.3 mg/dL 9.2 8.9 9.7  Total Protein 6.5 - 8.1 g/dL 6.7 6.7 7.4  Total Bilirubin 0.3 - 1.2 mg/dL 1.1 1.4(H) 1.2  Alkaline Phos 38 - 126 U/L 82 74 70  AST 15 - 41 U/L 63(H) 63(H) 68(H)  ALT 14 - 54 U/L '16 15 16     '$ CBC Latest Ref Rng 09/02/2015 08/30/2015 09/01/2015  WBC 4.0 - 10.5 K/uL 18.7(H) 17.4(H) 16.9(H)  Hemoglobin 12.0 - 15.0 g/dL 10.8(L) 12.3 13.0  Hematocrit 36.0 - 46.0 % 33.4(L) 37.8 39.3  Platelets 150 - 400 K/uL 405(H) 387 461(H)     Dg Chest Port 1 View  09/02/2015  CLINICAL DATA:  13-year-old female with decreased O2 sat. History of ovarian cancer and pulmonary masses/passes. EXAM: PORTABLE CHEST 1 VIEW COMPARISON:  CT dated 09/08/2015 FINDINGS: Bilateral pulmonary nodular densities correspond to lesion seen on the CT. Patchy area of airspace opacity in the right mid lung  field corresponds to the large mass seen on the CT. There is diffuse increased interstitial prominence. There is blunting of the right costophrenic angle which may be related to nodules seen on the CT or a small pleural effusion. No pneumothorax. The cardiac silhouette is within normal limits. The osseous structures appear unremarkable. IMPRESSION: Increased interstitial prominence with bilateral pulmonary opacities corresponding to the masses seen on CT. No pneumothorax. Electronically Signed   By: Anner Crete M.D.   On: 09/02/2015 00:06     CULTURES: 1/13 Blood >> 1/14 Pneumococcal Ag >> negative 1/14 Legionella Ag >> negative 1/15 C diff PCR >> negative  ANTIBIOTICS: 1/13 Rocephin >> 1/13 1/13 Zithromax >> 1/13 1/14 Vancomycin >> 1/14 Zosyn >>   LINES/TUBES: 1/15 Rt PICC >>   STUDIES: 1/13 CT chest >> multiple b/l nodules, mass like consolidation Rt mid lung, Rt hilar and subcarinal mass, Lt hilar LAN, 2.7 cm Rt paratracheal LAN 1/14 CT abd/pelvis >> 10 cm mass superior to uterus 1/15 Tumor markers >> CA 19-9 453, CEA 3.9, CA 125 780.8 1/16 Bronchoscopy >>   EVENTS: 1/13 Admit 1/15 Gyn oncology consulted 1/16 Bronchoscopy in ICU; Placed on BiPAP for wheezing, dyspnea  DISCUSSION: 40 yo female with progressive dyspnea, wheezing.  She was found to have abnormal CT chest/abdomen/pelvis from metastatic cancer.  Most  likely Gyn malignancy with spread to lungs >> tumor markers are consistent with Gyn origin.  ASSESSMENT/PLAN:  Metastatic cancer. Plan: - f/u bronchoscopy results from 1/16 - f/u with Gyn oncology  Post-obstructive pneumonia Rt lung. Wheezing from tumor causing extrinsic airway compression. Acute hypoxic respiratory failure. Plan: - Day 5 of Abx per primary team - continue xopenex, pulmicort - BiPAP qhs and prn during the day >> hopeful can avoid need for intubation - oxygen to keep SpO2 > 92%  Updated pt's family at bedside.  CC time 32  minutes.  Chesley Mires, MD [facility] Pulmonary/Critical Care 09/02/2015, 7:42 AM Pager:  [phone] After 3pm call: [phone]

## 2015-09-02 NOTE — Consult Note (Signed)
Reason for Consult: Pelvic mass Referring Physician: Leisa Lenz, MD  Assessment/Plan: Multi-compartmental masses (chest, adenopathy, ovarian).  Multiple tumor marker elevation - non specific. Likely metastatic malignancy. No clear primary based on imaging.  I discussed with the patient, while viewing her CT imaging with her and her husband. I described that I suspect that she has a malignancy of sort, however, it is unclear what the primary is. I discussed that the mass on her left ovary is suspicious for malignancy (as it is solid), however, there is no associated peritoneal disease. A non-epithelial malignancy is possible (such as germ cell - will draw LDH and AFP). Additionally, with a dominant mass in the lung in association with the smaller multiple lesions, this picture is less consistent with ovarian cancer mets to the lung (which are typically more uniform in size and distribution).   We await the findings from pathology taken from the lung biopsy yesterday and its immunohistochemistry to better determine a) confirm malignancy, and b) determine likely primary.  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 Therefore, she will require chemotherapy as primary therapy. I recommend consultation with the appropriate medical oncology subspecialist following biopsy results.  HPI  Monique [name] is an 40 y.o. female.  HPI:  40 yo P1 LMP 1/13 who is seen at the request of Leisa Lenz MD for further evaluation of a newly diagnosed pelvic mass.  The patient gives a several month history of cough, DOE and fevers/night sweats.  She has been treated for a community-acquired pneumonia.  She has also developed diarrhea.  She presented to the [facility] 2 days ago with worsening symptoms.  Labs were remarkable for a leukocytosis.  Imaging including a CT of the chest showed a right-middle lobe mass with lesions worrisome  for metastatic disease.  A CT scan of the abdomen/pelvis a possible 10 cm adnexal mass.  She denies any changes in her weight, bladder or bowel habits prior to the onset of the diarrhea.  She denies any recent abnormal uterine bleeding.  Her Pap smears have been normal per her report.  She has not started routine mammogram screenings.  The couple uses the withdrawal method for contraception.  History reviewed. No pertinent past medical history.  History reviewed. No pertinent past surgical history.  Family History  Problem Relation Age of Onset  . Lymphoma Mother   . CAD Mother     Social History:  reports that she has never smoked. She does not have any smokeless tobacco history on file. She reports that she drinks alcohol. She reports that she does not use illicit drugs.  Allergies: No Known Allergies  Medications: I have reviewed the patient's current medications.  Results for orders placed or performed during the hospital encounter of 09/06/2015 (from the past 48 hour(s))  Vancomycin, trough     Status: None   Collection Time: 08/31/15  6:50 PM  Result Value Ref Range   Vancomycin Tr 15 10.0 - 20.0 ug/mL  Pregnancy, urine     Status: None   Collection Time: 09/01/15  8:05 AM  Result Value Ref Range   Preg Test, Ur NEGATIVE NEGATIVE    Comment:        THE SENSITIVITY OF THIS METHODOLOGY IS >20 mIU/mL.   Comprehensive metabolic panel     Status: Abnormal   Collection Time: 09/02/15  5:10 AM  Result Value Ref Range   Sodium 135 135 -  145 mmol/L   Potassium 3.5 3.5 - 5.1 mmol/L   Chloride 95 (L) 101 - 111 mmol/L   CO2 27 22 - 32 mmol/L   Glucose, Bld 123 (H) 65 - 99 mg/dL   BUN 17 6 - 20 mg/dL   Creatinine, Ser 0.69 0.44 - 1.00 mg/dL   Calcium 9.2 8.9 - 10.3 mg/dL   Total Protein 6.7 6.5 - 8.1 g/dL   Albumin 2.7 (L) 3.5 - 5.0 g/dL   AST 63 (H) 15 - 41 U/L   ALT 16 14 - 54 U/L   Alkaline Phosphatase 82 38 - 126 U/L   Total Bilirubin 1.1 0.3 - 1.2 mg/dL   GFR calc non Af  Amer >60 >60 mL/min   GFR calc Af Amer >60 >60 mL/min    Comment: (NOTE) The eGFR has been calculated using the CKD EPI equation. This calculation has not been validated in all clinical situations. eGFR's persistently <60 mL/min signify possible Chronic Kidney Disease.    Anion gap 13 5 - 15  CBC with Differential/Platelet     Status: Abnormal   Collection Time: 09/02/15  5:10 AM  Result Value Ref Range   WBC 18.7 (H) 4.0 - 10.5 K/uL   RBC 4.00 3.87 - 5.11 MIL/uL   Hemoglobin 10.8 (L) 12.0 - 15.0 g/dL   HCT 33.4 (L) 36.0 - 46.0 %   MCV 83.5 78.0 - 100.0 fL   MCH 27.0 26.0 - 34.0 pg   MCHC 32.3 30.0 - 36.0 g/dL   RDW 13.5 11.5 - 15.5 %   Platelets 405 (H) 150 - 400 K/uL   Neutrophils Relative % 86 %   Neutro Abs 16.2 (H) 1.7 - 7.7 K/uL   Lymphocytes Relative 5 %   Lymphs Abs 0.9 0.7 - 4.0 K/uL   Monocytes Relative 9 %   Monocytes Absolute 1.6 (H) 0.1 - 1.0 K/uL   Eosinophils Relative 0 %   Eosinophils Absolute 0.0 0.0 - 0.7 K/uL   Basophils Relative 0 %   Basophils Absolute 0.0 0.0 - 0.1 K/uL    Dg Chest Port 1 View  09/02/2015  CLINICAL DATA:  3-year-old female with decreased O2 sat. History of ovarian cancer and pulmonary masses/passes. EXAM: PORTABLE CHEST 1 VIEW COMPARISON:  CT dated 08/27/2015 FINDINGS: Bilateral pulmonary nodular densities correspond to lesion seen on the CT. Patchy area of airspace opacity in the right mid lung field corresponds to the large mass seen on the CT. There is diffuse increased interstitial prominence. There is blunting of the right costophrenic angle which may be related to nodules seen on the CT or a small pleural effusion. No pneumothorax. The cardiac silhouette is within normal limits. The osseous structures appear unremarkable. IMPRESSION: Increased interstitial prominence with bilateral pulmonary opacities corresponding to the masses seen on CT. No pneumothorax. Electronically Signed   By: Arash  Radparvar M.D.   On: 09/02/2015 00:06     Review of Systems  Constitutional: Positive for fever.  Respiratory: Positive for cough and shortness of breath.   Gastrointestinal: Negative for abdominal pain.   Blood pressure 158/74, pulse 143, temperature 98.3 F (36.8 C), temperature source Oral, resp. rate 28, height 5' 5.5" (1.664 m), weight 177 lb 7.5 oz (80.5 kg), last menstrual period 08/30/2015, SpO2 90 %. Physical Exam  Constitutional: She appears well-developed. No distress.  GI: Soft. She exhibits no distension. There is no tenderness.    Rossi, Emma Caroline 09/02/2015, 6:00 PM      

## 2015-09-02 NOTE — Progress Notes (Signed)
eLink Physician-Brief Progress Note Patient Name: ERION HERMANS DOB: 11-Jul-1976 MRN: [id number]   Date of Service  09/02/2015  HPI/Events of Note    eICU Interventions       Intervention Category Intermediate Interventions: Other:  Raylene Miyamoto. 09/02/2015, 4:12 AM

## 2015-09-02 NOTE — Progress Notes (Signed)
   09/02/15 1100  Clinical Encounter Type  Visited With Patient and family together  Visit Type Initial  Referral From Nurse;Chaplain  Consult/Referral To Nurse  Recommendations follow for continued assessment / support  Stress Factors  Patient Stress Factors Health changes   Chaplain introduced spiritual care as resource.   Mrs Verhoeven, spouse Gerald Stabs), and mother in law at bedside.  They are remaining hopeful as they wait to hear results of bronchoscopy.   Couple have 65 year-old son.    Chaplain informed of resources at [place] as they hear back about results and discern their path forward.  Will follow up for continued assessment and support.      [place], [place]

## 2015-09-03 ENCOUNTER — Inpatient Hospital Stay (HOSPITAL_COMMUNITY): Payer: BC Managed Care – PPO

## 2015-09-03 DIAGNOSIS — R591 Generalized enlarged lymph nodes: Secondary | ICD-10-CM | POA: Insufficient documentation

## 2015-09-03 DIAGNOSIS — C799 Secondary malignant neoplasm of unspecified site: Secondary | ICD-10-CM | POA: Insufficient documentation

## 2015-09-03 DIAGNOSIS — N9489 Other specified conditions associated with female genital organs and menstrual cycle: Secondary | ICD-10-CM

## 2015-09-03 DIAGNOSIS — R599 Enlarged lymph nodes, unspecified: Secondary | ICD-10-CM | POA: Insufficient documentation

## 2015-09-03 LAB — BASIC METABOLIC PANEL
ANION GAP: 16 — AB (ref 5–15)
BUN: 19 mg/dL (ref 6–20)
CO2: 27 mmol/L (ref 22–32)
Calcium: 9.5 mg/dL (ref 8.9–10.3)
Chloride: 91 mmol/L — ABNORMAL LOW (ref 101–111)
Creatinine, Ser: 0.58 mg/dL (ref 0.44–1.00)
GFR calc Af Amer: >60 mL/min (ref >60)
Glucose, Bld: 106 mg/dL — ABNORMAL HIGH (ref 65–99)
POTASSIUM: 3.2 mmol/L — AB (ref 3.5–5.1)
SODIUM: 134 mmol/L — AB (ref 135–145)

## 2015-09-03 LAB — CULTURE, BLOOD (ROUTINE X 2)
CULTURE: NO GROWTH
CULTURE: NO GROWTH

## 2015-09-03 LAB — CBC
HEMATOCRIT: 33.5 % — AB (ref 36.0–46.0)
Hemoglobin: 10.7 g/dL — ABNORMAL LOW (ref 12.0–15.0)
MCH: 26.6 pg (ref 26.0–34.0)
MCHC: 31.9 g/dL (ref 30.0–36.0)
MCV: 83.3 fL (ref 78.0–100.0)
Platelets: 416 10*3/uL — ABNORMAL HIGH (ref 150–400)
RBC: 4.02 MIL/uL (ref 3.87–5.11)
RDW: 13.5 % (ref 11.5–15.5)
WBC: 17.7 10*3/uL — AB (ref 4.0–10.5)

## 2015-09-03 LAB — VANCOMYCIN, TROUGH: VANCOMYCIN TR: 15 ug/mL (ref 10.0–20.0)

## 2015-09-03 LAB — LACTATE DEHYDROGENASE: LDH: 554 U/L — AB (ref 98–192)

## 2015-09-03 MED ORDER — MORPHINE SULFATE (PF) 2 MG/ML IV SOLN
0.5000 mg | Freq: Once | INTRAVENOUS | Status: AC
  Administered 2015-09-03: 0.5 mg via INTRAVENOUS

## 2015-09-03 MED ORDER — MORPHINE SULFATE (PF) 2 MG/ML IV SOLN
1.0000 mg | INTRAVENOUS | Status: DC | PRN
  Administered 2015-09-03 – 2015-09-04 (×4): 2 mg via INTRAVENOUS
  Filled 2015-09-03 (×3): qty 1

## 2015-09-03 MED ORDER — POTASSIUM CHLORIDE CRYS ER 20 MEQ PO TBCR
40.0000 meq | EXTENDED_RELEASE_TABLET | Freq: Once | ORAL | Status: AC
  Administered 2015-09-03: 40 meq via ORAL
  Filled 2015-09-03: qty 2

## 2015-09-03 NOTE — Progress Notes (Signed)
Pharmacy Antibiotic Follow-up Note  Monique [name] is a 40 y.o. year-old female admitted on 09/09/2015.  The patient is currently on day 6 of antibiotics. Day 5 of Vancomycin 1g IV q8h and Zosyn 3.375g IV q8h (4 hour infusion time) for post-obstructive pneumonia.  Failed outpatient treatments with azithromycin, levofloxacin, clarithromycin in past month.  Assessment/Plan: Vanc trough is in the target range. Cont Vanc 1g IV q8h.  Cont Zosyn 3.375g IV Q8H infused over 4hrs. MD, since MRSA PCR and all cultures are negative, consider de-escalating abx soon, or stopping at 7 days.   Temp (24hrs), Avg:98.4 F (36.9 C), Min:98.3 F (36.8 C), Max:98.6 F (37 C)   Recent Labs Lab 09/10/2015 1750 08/30/15 1826 09/02/15 0510 09/03/15 0507  WBC 16.9* 17.4* 18.7* 17.7*     Recent Labs Lab 09/06/2015 1750 08/30/15 1826 09/02/15 0510 09/03/15 0507  CREATININE 0.80 0.70 0.69 0.58   Estimated Creatinine Clearance: 99 mL/min (by C-G formula based on Cr of 0.58).    No Known Allergies  Antimicrobials this admission: 1/13 >> Rocephin >> 1/13 1/13 >> Zithromax >> 1/13 1/14 >> Vanc >> 1/14 >> Zosyn >>   Levels/dose changes this admission: 1/15 1900 VT: 15 on 1g IV q8h 1/18 1130 VT: 15 on 1g IV q8h  Microbiology results: 1/13 blood x 2: ngtd HIV screen: neg S. pneumo UAg: neg Legionella UAg: neg MRSA: neg Cdiff: neg  Thank you for allowing pharmacy to be a part of this patient's care.  Romeo Rabon, PharmD, pager [phone]. 09/03/2015,12:39 PM.

## 2015-09-03 NOTE — Consult Note (Signed)
Chief Complaint: Patient was seen in consultation today for CT-guided retroperitoneal lymph node biopsy Chief Complaint  Patient presents with  . PNA     Referring Physician(s): CCM  History of Present Illness: Monique [name] is a 40 y.o. female admitted to the hospital on 08/27/2015 secondary to worsening shortness of breath and wheezing. Patient had been treated previously for pneumonia with no resolution.Subsequent imaging revealed a large central right middle lobe mass, bulky mediastinal and bilateral hilar adenopathy, multiple bilateral discrete pulmonary nodules and nodular areas of airspace opacification concerning for metastatic disease,  large mass superior to the uterus as well as retroperitoneal and  gastrohepatic ligament adenopathy. Pathology from recent bronchoscopy revealed crush artifact and findings concerning for small cell cancer but not diagnostic. She has elevated CA-19-9 and CA 125 levels. Request now received from critical care medicine for CT-guided biopsy of the retroperitoneal adenopathy.  Allergies: Review of patient's allergies indicates no known allergies.  Medications: Prior to Admission medications   Medication Sig Start Date End Date Taking? Authorizing Provider  albuterol (PROVENTIL HFA;VENTOLIN HFA) 108 (90 Base) MCG/ACT inhaler Inhale 2 puffs into the lungs every 4 (four) hours as needed for wheezing or shortness of breath.   Yes Historical Provider, MD  Ascorbic Acid (VITAMIN C PO) Take 1 tablet by mouth daily.   Yes Historical Provider, MD  budesonide-formoterol (SYMBICORT) 160-4.5 MCG/ACT inhaler Inhale 2 puffs into the lungs 2 (two) times daily.   Yes Historical Provider, MD  Cholecalciferol (VITAMIN D PO) Take 1 tablet by mouth daily.   Yes Historical Provider, MD  clarithromycin (BIAXIN) 500 MG tablet Take 500 mg by mouth 2 (two) times daily. For 10 days 08/26/15-09/05/15 08/26/15  Yes Historical Provider, MD  levofloxacin (LEVAQUIN) 500 MG  tablet Take 500 mg by mouth daily. Reported on 09/14/2015 08/18/15   Historical Provider, MD     Family History  Problem Relation Age of Onset  . Lymphoma Mother   . CAD Mother     Social History   Social History  . Marital Status: Married    Spouse Name: N/A  . Number of Children: N/A  . Years of Education: N/A   Social History Main Topics  . Smoking status: Never Smoker   . Smokeless tobacco: None  . Alcohol Use: Yes     Comment: rare  . Drug Use: No  . Sexual Activity: Not Asked   Other Topics Concern  . None   Social History Narrative      Review of Systems patient currently denies fever, headache, chest pain, back pain, nausea vomiting or abnormal bleeding. She does have cough, dyspnea, generalized abdominal discomfort and some loose stools.  Vital Signs: BP 147/99 mmHg  Pulse 132  Temp(Src) 98.5 F (36.9 C) (Oral)  Resp 33  Ht 5' 5.5" (1.664 m)  Wt 177 lb 7.5 oz (80.5 kg)  BMI 29.07 kg/m2  SpO2 95%  LMP 09/01/2015  Physical Exam patient awake, alert. On BiPAP. Dyspneic; heart -tachycardic but regular rhythm; chest with diminished breath sounds at bases, few scattered wheezes; abdomen soft, few bowel sounds, mild generalized tenderness to palpation; extremities with no edema.  Mallampati Score:     Imaging: Dg Chest 2 View  09/14/2015  CLINICAL DATA:  Shortness of breath. Pneumonia over the past 4 days. EXAM: CHEST  2 VIEW COMPARISON:  08/26/2015 FINDINGS: Worsening patchy bilateral airspace opacities most confluent in the right middle lobe. The these have a nodular component if and underlying malignancy,  fungal disease, or fulminant tuberculosis cannot be excluded particularly if the patient has immunosuppression. Right lower paratracheal density may be incidental but could be from adenopathy. There is fullness of the left hilum. IMPRESSION: 1. Bilateral airspace opacities are worsened, with underlying interstitial accentuation and vague nodularity of these  opacities. Although multilobar bacterial pneumonia is a for possibility, other possibilities might include metastatic disease to the lungs, fungal disease, fulminant tuberculosis, or pulmonary hemorrhage. Correlate with the patient's symptoms. Chest CT to be utilized for further characterization if clinically warranted. Electronically Signed   By: Van Clines M.D.   On: 08/27/2015 16:59   Dg Chest 2 View  08/26/2015  CLINICAL DATA:  Persistent wheezing cough and shortness of breath for the past 3 weeks; unresponsive to steroids. EXAM: CHEST  2 VIEW COMPARISON:  None in PACs FINDINGS: There are confluent alveolar opacities occupying much of the right middle lobe with patchy areas of similar opacity in the left lung and right upper lobe. There is no pleural effusion. The heart and pulmonary vascularity are normal. The bony thorax is unremarkable. IMPRESSION: Multifocal alveolar opacities bilaterally most compatible with pneumonia. The right middle lobe appears the most significantly involved. These results will be called to the ordering clinician or representative by the Radiologist Assistant, and communication documented in the PACS or zVision Dashboard. Electronically Signed   By: David  Martinique M.D.   On: 08/26/2015 12:31   Ct Angio Chest Pe W/cm &/or Wo Cm  09/14/2015  CLINICAL DATA:  Patient states ill since Christmas, diagnosed with pneumonia 4 days ago on antibiotics with increase shortness of breath this morning. EXAM: CT ANGIOGRAPHY CHEST WITH CONTRAST TECHNIQUE: Multidetector CT imaging of the chest was performed using the standard protocol during bolus administration of intravenous contrast. Multiplanar CT image reconstructions and MIPs were obtained to evaluate the vascular anatomy. CONTRAST:  179m OMNIPAQUE IOHEXOL 350 MG/ML SOLN COMPARISON:  Chest x-ray 09/05/2015 and 08/26/2015 FINDINGS: Lungs are adequately inflated demonstrate multiple bilateral discrete nodules and nodular regions of  airspace opacification. There is masslike consolidation over the right midlung continuous with soft tissue density over the right hilum and subcarinal region. There is no pleural effusion. There is extensive mediastinal and bilateral hilar adenopathy. Subcarinal adenopathy measures 3.2 cm by short axis. A large right peritracheal node measures 2.7 cm by short axis. Adenopathy extends into the superior mediastinum and neck base at the level of the thyroid gland bilateral. Findings are concerning for central right lung neoplasm with metastatic disease. Adenopathy causes narrowing of the distal aspect of the main pulmonary arteries bilaterally. There is associated airway obstruction over the central right middle lobe. Heart is normal in size. No definite evidence of pulmonary embolism. No axillary adenopathy. Images through the upper abdomen demonstrate a few small right pericardial phrenic lymph nodes. Adenopathy adjacent the gastroesophageal junction with lymph node measuring 1.8 cm by short axis. There are degenerative changes of the spine. Review of the MIP images confirms the above findings. IMPRESSION: Large central right middle lobe lung mass without clear borders likely with mild associated postobstructive atelectasis. Bulky mediastinal and bilateral hilar adenopathy as described. Multiple bilateral discrete pulmonary nodules and nodular areas of airspace opacification likely metastatic disease. Right lung mass obstructs the right middle lobe bronchus. Adenopathy causes narrowing of the main pulmonary arteries. Adenopathy over the upper abdomen near the gastroesophageal junction likely metastatic disease. Recommend tissue diagnosis. No evidence of pulmonary embolism. Electronically Signed   By: DMarin OlpM.D.   On: 08/19/2015 20:38  Ct Abdomen Pelvis W Contrast  08/30/2015  CLINICAL DATA:  Pulmonary mass and bilateral metastatic nodules. EXAM: CT ABDOMEN AND PELVIS WITH CONTRAST TECHNIQUE: Multidetector  CT imaging of the abdomen and pelvis was performed using the standard protocol following bolus administration of intravenous contrast. CONTRAST:  23m OMNIPAQUE IOHEXOL 300 MG/ML SOLN, 1061mOMNIPAQUE IOHEXOL 300 MG/ML SOLN COMPARISON:  Chest CT 09/15/2015 FINDINGS: Lower chest: Mass in the rib RIGHT lower lobe/RIGHT middle lobe is again demonstrated. Multiple bilateral pulmonary nodules. Hepatobiliary: No focal hepatic lesion. No biliary duct dilatation. Gallbladder is normal. Common bile duct is normal. Pancreas: Pancreas is normal. No ductal dilatation. No pancreatic inflammation. Spleen: Normal spleen Adrenals/urinary tract: Adrenal glands and kidneys are normal. The ureters and bladder normal. Stomach/Bowel: Stomach, small bowel, appendix, and cecum are normal. The colon and rectosigmoid colon are normal. Vascular/Lymphatic: Abdominal aorta normal caliber. Gastrohepatic ligament and periaortic retroperitoneal adenopathy noted. Example gastrohepatic lymph node measures 19 mm (image 14, series 2). LEFT periaortic lymph node measures 1.9 cm (image 28, series 2). Reproductive: There is a large mass within the central pelvis superior to the uterus measuring 10 cm by 9.5 cm (image 58, series 2). This mass positioned between the ovaries. RIGHT ovary appears normal. LEFT ovary is not as well-defined. Uterus appears normal. Other: Free fluid the pelvis. Musculoskeletal: No aggressive osseous lesion. IMPRESSION: 1. Large mass superior to the uterus and position between the ovaries measuring up to 10 cm. Differential includes metastasis from potential bronchogenic carcinoma versus a PRIMARY OVARIAN MALIGNANCY. Lymphoma could also be considered. 2. Retroperitoneal and gastrohepatic ligament metastatic adenopathy. 3. 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 Differential would include primary bronchogenic carcinoma including SMALL CELL LUNG CARCINOMA. Electronically Signed   By: StSuzy Bouchard.D.   On:  08/30/2015 19:15   Dg Chest Port 1 View  09/03/2015  CLINICAL DATA:  Low oxygen saturations.  Ovarian cancer . EXAM: PORTABLE CHEST 1 VIEW COMPARISON:  09/01/2015.  CT 09/11/2015 . FINDINGS: Right PICC line stable position. mediastinum and hilar structures are normal. Heart size normal. Multifocal bilateral pulmonary i masses again noted. Small right pleural effusion. No pneumothorax. IMPRESSION: 1. Right PICC line in stable position. 2. Multifocal bilateral pulmonary mass lesions.  No interim change . Electronically Signed   By: ThMarcello MooresRegister   On: 09/03/2015 07:56   Dg Chest Port 1 View  09/02/2015  CLINICAL DATA:  4-42ear-old female with decreased O2 sat. History of ovarian cancer and pulmonary masses/passes. EXAM: PORTABLE CHEST 1 VIEW COMPARISON:  CT dated 08/19/2015 FINDINGS: Bilateral pulmonary nodular densities correspond to lesion seen on the CT. Patchy area of airspace opacity in the right mid lung field corresponds to the large mass seen on the CT. There is diffuse increased interstitial prominence. There is blunting of the right costophrenic angle which may be related to nodules seen on the CT or a small pleural effusion. No pneumothorax. The cardiac silhouette is within normal limits. The osseous structures appear unremarkable. IMPRESSION: Increased interstitial prominence with bilateral pulmonary opacities corresponding to the masses seen on CT. No pneumothorax. Electronically Signed   By: ArAnner Crete.D.   On: 09/02/2015 00:06    Labs:  CBC:  Recent Labs  08/30/2015 1750 08/30/15 1826 09/02/15 0510 09/03/15 0507  WBC 16.9* 17.4* 18.7* 17.7*  HGB 13.0 12.3 10.8* 10.7*  HCT 39.3 37.8 33.4* 33.5*  PLT 461* 387 405* 416*    COAGS:  Recent Labs  08/30/15 1826  INR 1.18    BMP:  Recent Labs  09/14/2015 1750 08/30/15 1826 09/02/15 0510 09/03/15 0507  NA 136 132* 135 134*  K 4.1 4.2 3.5 3.2*  CL 100* 97* 95* 91*  CO2 '22 22 27 27  '$ GLUCOSE 119* 93 123* 106*  BUN  '15 15 17 19  '$ CALCIUM 9.7 8.9 9.2 9.5  CREATININE 0.80 0.70 0.69 0.58  GFRNONAA >60 >60 >60 >60  GFRAA >60 >60 >60 >60    LIVER FUNCTION TESTS:  Recent Labs  09/08/2015 1750 08/30/15 1826 09/02/15 0510  BILITOT 1.2 1.4* 1.1  AST 68* 63* 63*  ALT '16 15 16  '$ ALKPHOS 70 74 82  PROT 7.4 6.7 6.7  ALBUMIN 3.6 3.3* 2.7*    TUMOR MARKERS:  Recent Labs  08/31/15 1000 08/31/15 1014  CEA  --  3.9  CA199 453*  --     Assessment and Plan: 40 y.o. female admitted to the hospital on 09/16/2015 secondary to worsening shortness of breath and wheezing. Patient had been treated previously for pneumonia with no resolution.Subsequent imaging revealed a large central right middle lobe mass, bulky mediastinal and bilateral hilar adenopathy, multiple bilateral discrete pulmonary nodules and nodular areas of airspace opacification concerning for metastatic disease,  large mass superior to the uterus as well as retroperitoneal and  gastrohepatic ligament adenopathy. Pathology from recent bronchoscopy revealed crush artifact and findings concerning for small cell cancer but not diagnostic. She has elevated CA-19-9 and CA 125 levels. Request now received from critical care medicine for CT-guided biopsy of the retroperitoneal adenopathy. Imaging studies have been reviewed by Dr. Annamaria Boots. Case has also been discussed with Dr.Sood. Details/risks of biopsy, including not limited to, internal bleeding, infection, respiratory difficulties and need for intubation discussed with patient and husband with their understanding and consent. CCM to evaluate patient in a.m. to assess whether intubation needed prior to prone positioning for biopsy. Procedure tentatively scheduled for 1/19.    Thank you for this interesting consult.  I greatly enjoyed meeting ANAYIA EUGENE and look forward to participating in their care.  A copy of this report was sent to the requesting provider on this date.  Electronically Signed: D. Rowe Robert 09/03/2015, 4:32 PM   I spent a total of 20 minutes  in face to face in clinical consultation, greater than 50% of which was counseling/coordinating care for CT guided retroperitoneal lymph node biopsy

## 2015-09-03 NOTE — Progress Notes (Signed)
eLink Physician-Brief Progress Note Patient Name: Monique [name] DOB: 09/04/1975 MRN: [id number]   Date of Service  09/03/2015  HPI/Events of Note  Hypoxic , on bipap  eICU Interventions  Increase PEEP to 8, FIO2 to 60% Morphine for WOB     Intervention Category Major Interventions: Respiratory failure - evaluation and management  Josiah Nieto V. 09/03/2015, 3:27 PM

## 2015-09-03 NOTE — Progress Notes (Signed)
PCCM PROGRESS NOTE  ADMISSION DATE: 08/20/2015 CONSULT DATE: 08/30/2015 REFERRING PROVIDER: Dr. Roel Cluck, [facility]  CC: Short of breath  SUBJECTIVE: More short of breath and fatigued.  VITAL SIGNS: BP 152/73 mmHg  Pulse 132  Temp(Src) 98.5 F (36.9 C) (Oral)  Resp 30  Ht 5' 5.5" (1.664 m)  Wt 177 lb 7.5 oz (80.5 kg)  BMI 29.07 kg/m2  SpO2 92%  LMP 08/30/2015  INTAKE/OUTPUT: I/O last 3 completed shifts: In: 2020 [I.V.:520; IV Piggyback:1500] Out: 5200 [Urine:5200]  General: alert, appears fatigued HEENT: using some accessory muscles Cardiac: regular, tachycardic Chest: decreased BS at bases, b/l expiratory wheezing Abd: non tender Ext: no edema Neuro: normal strength Skin: no rashes   CMP Latest Ref Rng 09/03/2015 09/02/2015 08/30/2015  Glucose 65 - 99 mg/dL 106(H) 123(H) 93  BUN 6 - 20 mg/dL '19 17 15  '$ Creatinine 0.44 - 1.00 mg/dL 0.58 0.69 0.70  Sodium 135 - 145 mmol/L 134(L) 135 132(L)  Potassium 3.5 - 5.1 mmol/L 3.2(L) 3.5 4.2  Chloride 101 - 111 mmol/L 91(L) 95(L) 97(L)  CO2 22 - 32 mmol/L '27 27 22  '$ Calcium 8.9 - 10.3 mg/dL 9.5 9.2 8.9  Total Protein 6.5 - 8.1 g/dL - 6.7 6.7  Total Bilirubin 0.3 - 1.2 mg/dL - 1.1 1.4(H)  Alkaline Phos 38 - 126 U/L - 82 74  AST 15 - 41 U/L - 63(H) 63(H)  ALT 14 - 54 U/L - 16 15     CBC Latest Ref Rng 09/03/2015 09/02/2015 08/30/2015  WBC 4.0 - 10.5 K/uL 17.7(H) 18.7(H) 17.4(H)  Hemoglobin 12.0 - 15.0 g/dL 10.7(L) 10.8(L) 12.3  Hematocrit 36.0 - 46.0 % 33.5(L) 33.4(L) 37.8  Platelets 150 - 400 K/uL 416(H) 405(H) 387     Dg Chest Port 1 View  09/03/2015  CLINICAL DATA:  Low oxygen saturations.  Ovarian cancer . EXAM: PORTABLE CHEST 1 VIEW COMPARISON:  09/01/2015.  CT 09/08/2015 . FINDINGS: Right PICC line stable position. mediastinum and hilar structures are normal. Heart size normal. Multifocal bilateral pulmonary i masses again noted. Small right pleural effusion. No pneumothorax. IMPRESSION: 1. Right PICC line in stable  position. 2. Multifocal bilateral pulmonary mass lesions.  No interim change . Electronically Signed   By: Marcello Moores  Register   On: 09/03/2015 07:56   Dg Chest Port 1 View  09/02/2015  CLINICAL DATA:  40-year-old female with decreased O2 sat. History of ovarian cancer and pulmonary masses/passes. EXAM: PORTABLE CHEST 1 VIEW COMPARISON:  CT dated 08/21/2015 FINDINGS: Bilateral pulmonary nodular densities correspond to lesion seen on the CT. Patchy area of airspace opacity in the right mid lung field corresponds to the large mass seen on the CT. There is diffuse increased interstitial prominence. There is blunting of the right costophrenic angle which may be related to nodules seen on the CT or a small pleural effusion. No pneumothorax. The cardiac silhouette is within normal limits. The osseous structures appear unremarkable. IMPRESSION: Increased interstitial prominence with bilateral pulmonary opacities corresponding to the masses seen on CT. No pneumothorax. Electronically Signed   By: Anner Crete M.D.   On: 09/02/2015 00:06     CULTURES: 1/13 Blood >> 1/14 Pneumococcal Ag >> negative 1/14 Legionella Ag >> negative 1/15 C diff PCR >> negative  ANTIBIOTICS: 1/13 Rocephin >> 1/13 1/13 Zithromax >> 1/13 1/14 Vancomycin >> 1/14 Zosyn >>   LINES/TUBES: 1/15 Rt PICC >>   STUDIES: 1/13 CT chest >> multiple b/l nodules, mass like consolidation Rt mid lung, Rt hilar and subcarinal mass,  Lt hilar LAN, 2.7 cm Rt paratracheal LAN 1/14 CT abd/pelvis >> 10 cm mass superior to uterus 1/15 Tumor markers >> CA 19-9 453, CEA 3.9, CA 125 780.8 1/16 Bronchoscopy >>   EVENTS: 1/13 Admit 1/15 Gyn oncology consulted 1/16 Bronchoscopy in ICU; Placed on BiPAP for wheezing, dyspnea  DISCUSSION: 40 yo female with progressive dyspnea, wheezing.  She was found to have abnormal CT chest/abdomen/pelvis from metastatic cancer.  Might have 2 separate primaries >> bronchoscopy results show crush artifact and  concerning for small cell cancer (biopsies not diagnostic); she also has pelvic mass with positive Gyn tumor markers.  ASSESSMENT/PLAN:  Metastatic cancer. Plan: - have asked IR to assess for needle biopsy of peripheral lung lesion - other alternative would be to have thoracic surgery assess for mediastinoscopy - f/u with Gyn oncology  Post-obstructive pneumonia Rt lung. Wheezing from tumor causing extrinsic airway compression. Acute hypoxic respiratory failure. Plan: - Day 6 of Abx per primary team - continue xopenex, pulmicort - BiPAP qhs and prn during the day >> might need intubation - oxygen to keep SpO2 > 92% - f/u CXR 1/19  Anemia of critical illness. Plan: - f/u CBC  Hypokalemia. Plan: - f/u and replace as needed  Updated pt's family at bedside.  CC time 33 minutes.  Chesley Mires, MD [facility] Pulmonary/Critical Care 09/03/2015, 1:54 PM Pager:  [phone] After 3pm call: [phone]

## 2015-09-03 NOTE — Progress Notes (Signed)
eLink Physician-Brief Progress Note Patient Name: Monique [name] DOB: 10-25-1975 MRN: [id number]   Date of Service  09/03/2015  HPI/Events of Note    eICU Interventions       Intervention Category Intermediate Interventions: Pain - evaluation and management  Raylene Miyamoto. 09/03/2015, 1:11 AM

## 2015-09-03 NOTE — Progress Notes (Signed)
   09/03/15 1500  Clinical Encounter Type  Visited With Patient and family together  Visit Type Initial;Spiritual support;Social support  Referral From Chaplain  Consult/Referral To None  Spiritual Encounters  Spiritual Needs Emotional  Stress Factors  Patient Stress Factors Lack of knowledge;Health changes;Major life changes  Family Stress Factors Lack of knowledge;Major life changes   Counseling intern provided brief emotional support to pt and pt's husband. Counseling intern discussed counseling services and how to access them and provided contact information for the future. Pt and husband acknowledged feeling exhausted and overwhelmed. Pt's husband indicated they are still waiting for information and will contact me for counseling as they have more information.  Duffy Rhody Counseling Intern

## 2015-09-04 ENCOUNTER — Encounter (HOSPITAL_COMMUNITY): Payer: Self-pay | Admitting: Radiology

## 2015-09-04 ENCOUNTER — Inpatient Hospital Stay (HOSPITAL_COMMUNITY): Payer: BC Managed Care – PPO

## 2015-09-04 LAB — CBC
HEMATOCRIT: 34.1 % — AB (ref 36.0–46.0)
HEMATOCRIT: 34.1 % — AB (ref 36.0–46.0)
Hemoglobin: 10.8 g/dL — ABNORMAL LOW (ref 12.0–15.0)
Hemoglobin: 10.8 g/dL — ABNORMAL LOW (ref 12.0–15.0)
MCH: 26.7 pg (ref 26.0–34.0)
MCH: 26.7 pg (ref 26.0–34.0)
MCHC: 31.7 g/dL (ref 30.0–36.0)
MCHC: 31.7 g/dL (ref 30.0–36.0)
MCV: 84.2 fL (ref 78.0–100.0)
MCV: 84.4 fL (ref 78.0–100.0)
PLATELETS: 408 10*3/uL — AB (ref 150–400)
PLATELETS: 406 10*3/uL — AB (ref 150–400)
RBC: 4.05 MIL/uL (ref 3.87–5.11)
RBC: 4.04 MIL/uL (ref 3.87–5.11)
RDW: 13.7 % (ref 11.5–15.5)
RDW: 13.9 % (ref 11.5–15.5)
WBC: 17.5 10*3/uL — ABNORMAL HIGH (ref 4.0–10.5)
WBC: 18 10*3/uL — AB (ref 4.0–10.5)

## 2015-09-04 LAB — BASIC METABOLIC PANEL
Anion gap: 16 — ABNORMAL HIGH (ref 5–15)
BUN: 20 mg/dL (ref 6–20)
CALCIUM: 9.4 mg/dL (ref 8.9–10.3)
CO2: 27 mmol/L (ref 22–32)
CREATININE: 0.62 mg/dL (ref 0.44–1.00)
Chloride: 91 mmol/L — ABNORMAL LOW (ref 101–111)
GFR calc Af Amer: >60 mL/min (ref >60)
GFR calc non Af Amer: >60 mL/min (ref >60)
GLUCOSE: 102 mg/dL — AB (ref 65–99)
Potassium: 3.9 mmol/L (ref 3.5–5.1)
Sodium: 134 mmol/L — ABNORMAL LOW (ref 135–145)

## 2015-09-04 LAB — TRIGLYCERIDES: Triglycerides: 170 mg/dL — ABNORMAL HIGH (ref <150)

## 2015-09-04 LAB — AFP TUMOR MARKER: AFP TUMOR MARKER: 4.4 ng/mL (ref 0.0–8.3)

## 2015-09-04 LAB — MAGNESIUM: MAGNESIUM: 2.2 mg/dL (ref 1.7–2.4)

## 2015-09-04 MED ORDER — MIDAZOLAM HCL 2 MG/2ML IJ SOLN: 2.0000 mg | INTRAMUSCULAR | Status: DC | PRN

## 2015-09-04 MED ORDER — PROPOFOL 1000 MG/100ML IV EMUL
INTRAVENOUS | Status: AC
  Filled 2015-09-04: qty 100

## 2015-09-04 MED ORDER — PROPOFOL 1000 MG/100ML IV EMUL
0.0000 ug/kg/min | INTRAVENOUS | Status: DC
  Administered 2015-09-04 (×2): 50 ug/kg/min via INTRAVENOUS
  Administered 2015-09-04: 20 ug/kg/min via INTRAVENOUS
  Administered 2015-09-04 – 2015-09-05 (×4): 50 ug/kg/min via INTRAVENOUS
  Administered 2015-09-05: 40 ug/kg/min via INTRAVENOUS
  Administered 2015-09-05: 50 ug/kg/min via INTRAVENOUS
  Administered 2015-09-06: 35 ug/kg/min via INTRAVENOUS
  Administered 2015-09-06: 25 ug/kg/min via INTRAVENOUS
  Administered 2015-09-06: 40 ug/kg/min via INTRAVENOUS
  Administered 2015-09-06: 50 ug/kg/min via INTRAVENOUS
  Administered 2015-09-07 (×2): 40 ug/kg/min via INTRAVENOUS
  Filled 2015-09-04 (×15): qty 100

## 2015-09-04 MED ORDER — ANTISEPTIC ORAL RINSE SOLUTION (CORINZ)
7.0000 mL | OROMUCOSAL | Status: DC
  Administered 2015-09-04 – 2015-09-06 (×17): 7 mL via OROMUCOSAL

## 2015-09-04 MED ORDER — MIDAZOLAM HCL 2 MG/2ML IJ SOLN
INTRAMUSCULAR | Status: AC
  Filled 2015-09-04: qty 2

## 2015-09-04 MED ORDER — ETOMIDATE 2 MG/ML IV SOLN
10.0000 mg | Freq: Once | INTRAVENOUS | Status: AC
  Administered 2015-09-04: 10 mg via INTRAVENOUS

## 2015-09-04 MED ORDER — LEVALBUTEROL HCL 0.63 MG/3ML IN NEBU
0.6300 mg | INHALATION_SOLUTION | Freq: Four times a day (QID) | RESPIRATORY_TRACT | Status: DC
  Administered 2015-09-04 – 2015-09-30 (×102): 0.63 mg via RESPIRATORY_TRACT
  Filled 2015-09-04 (×105): qty 3

## 2015-09-04 MED ORDER — FENTANYL CITRATE (PF) 100 MCG/2ML IJ SOLN
INTRAMUSCULAR | Status: AC
  Filled 2015-09-04: qty 2

## 2015-09-04 MED ORDER — CHLORHEXIDINE GLUCONATE 0.12% ORAL RINSE (MEDLINE KIT)
15.0000 mL | Freq: Two times a day (BID) | OROMUCOSAL | Status: DC
  Administered 2015-09-04 – 2015-09-06 (×5): 15 mL via OROMUCOSAL

## 2015-09-04 MED ORDER — FENTANYL CITRATE (PF) 100 MCG/2ML IJ SOLN
100.0000 ug | Freq: Once | INTRAMUSCULAR | Status: AC
  Administered 2015-09-04: 100 ug via INTRAVENOUS

## 2015-09-04 MED ORDER — PANTOPRAZOLE SODIUM 40 MG IV SOLR
40.0000 mg | INTRAVENOUS | Status: DC
  Administered 2015-09-04: 40 mg via INTRAVENOUS
  Filled 2015-09-04: qty 40

## 2015-09-04 MED ORDER — FENTANYL BOLUS VIA INFUSION
50.0000 ug | INTRAVENOUS | Status: DC | PRN
  Filled 2015-09-04: qty 50

## 2015-09-04 MED ORDER — MIDAZOLAM HCL 2 MG/2ML IJ SOLN
2.0000 mg | Freq: Once | INTRAMUSCULAR | Status: AC
  Administered 2015-09-04: 2 mg via INTRAVENOUS

## 2015-09-04 MED ORDER — SODIUM CHLORIDE 0.9 % IV SOLN
25.0000 ug/h | INTRAVENOUS | Status: DC
  Administered 2015-09-04: 50 ug/h via INTRAVENOUS
  Administered 2015-09-05 – 2015-09-06 (×4): 100 ug/h via INTRAVENOUS
  Filled 2015-09-04 (×4): qty 50

## 2015-09-04 NOTE — Progress Notes (Signed)
Pt transported to CT and back with no apparent complications.

## 2015-09-04 NOTE — Progress Notes (Addendum)
Providing continued support with spouse, Gerald Stabs, at bedside.  Pt had just returned from biopsy and was on vent.    Gerald Stabs identified exhaustion as well as uncertainty in how to support his spouse at this point.  He wishes to be able to "do something" and Mrs Torosian has repeatedly told him "I don't need anything"   He feels she is in denial.  Throughout conversation, Gerald Stabs placed Mrs. Beyene's responses to progressing illness into context of his experience of her and her narrative in general.   He expresses shock around the rapidity of her progression and expressed frustration that he is not able to "do anything."  Identified some tension with nursing assistance when he wishes to help with bedside care.    He is relying on his christian faith for coping, however, states that she does not feel the same need.  This has been difficult for him when he wishes to speak about faith with her.     Shared prayers, emotional and grief support at bedside.

## 2015-09-04 NOTE — Progress Notes (Signed)
Initial Nutrition Assessment  DOCUMENTATION CODES:   Not applicable  INTERVENTION:  - Diet advancement as medically feasible following IR procedure today - RD will continue to monitor for needs  NUTRITION DIAGNOSIS:   Inadequate oral intake related to acute illness as evidenced by per patient/family report.  GOAL:   Patient will meet greater than or equal to 90% of their needs  MONITOR:   Diet advancement, PO intake, Weight trends, Labs, Skin, I & O's  REASON FOR ASSESSMENT:   Low Braden  ASSESSMENT:   Presented with about the month worsens worsening shortness of breath and wheezing. She has been seen for this by her primary care provider diagnosed with pneumonia based on chest x-ray and completed antibiotics is not improvement repeat chest x-ray showed persistent pneumonia she had a total three courses of antibiotics Including z-pack, levaquin and clarithromycin. Suspect that she has a malignancy of sort, however, it is unclear what the primary is;  Multi-compartmental masses (chest, adenopathy, ovarian).   Pt seen for low Braden. BMI indicates overweight status. Pt is NPO pending biopsy per IR today. Pt was switched from BiPAP to HFNC earlier this AM and was SOB during visit so family member, at bedside, provided the majority of information.  Pt had a good appetite with no recent changes in intakes, abilities, or taste sensation PTA. Since admission her appetite has decreased "to half of what it used to be." Family again confirms that this change started after admission. He states that pt has not been consuming food provided by the hospital, but, rather, family has been providing pt with outside foods that she really enjoys such as croissant sandwiches and subs from [employer]. Family member states that pt did very well with intakes yesterday to the point of being at her baseline.  Pt and family deny recent weight changes. No prior weight hx available in the chart. No muscle or fat  wasting noted during physical assessment.  Likely variably meeting needs since admission. Medications reviewed. Labs reviewed; Na: 134 mmol/L, Cl: 91 mmol/L.   Diet Order:  Diet NPO time specified Except for: Sips with Meds  Skin:  Reviewed, no issues  Last BM:  1/18  Height:   Ht Readings from Last 1 Encounters:  08/30/15 5' 5.5" (1.664 m)    Weight:   Wt Readings from Last 1 Encounters:  08/30/15 177 lb 7.5 oz (80.5 kg)    Ideal Body Weight:  57.95 kg (kg)  BMI:  Body mass index is 29.07 kg/(m^2).  Estimated Nutritional Needs:   Kcal:  2400-2600  Protein:  100-110 grams  Fluid:  >/= 2 L/day  EDUCATION NEEDS:   No education needs identified at this time     Jarome Matin, RD, LDN Inpatient Clinical Dietitian Pager # [phone] After hours/weekend pager # [phone]

## 2015-09-04 NOTE — Progress Notes (Signed)
Pt placed back on bipap per pt request. RT will continue to monitor.

## 2015-09-04 NOTE — Progress Notes (Signed)
100 mg fentanyl IV, 2 mg IV versed and 10 mg IV etomidate given for intubation.

## 2015-09-04 NOTE — Progress Notes (Signed)
Attempted to get Sputum,non expectorated, from her ETT. No secretions aspirated.

## 2015-09-04 NOTE — Progress Notes (Signed)
Date: September 04, 2015 Chart reviewed for concurrent status and case management needs. Will continue to follow patient for changes and needs: bipap Velva Harman, RN, [place], [place]   [phone]

## 2015-09-04 NOTE — Procedures (Signed)
Interventional Radiology Procedure Note  Procedure: CT guided bc LEFT RP adenopathy  Complications: None  Estimated Blood Loss: 0  Recommendations: - Path pending  Signed,  Criselda Peaches, MD

## 2015-09-04 NOTE — Procedures (Signed)
Intubation Procedure Note Monique [name] [id number] 1975/08/25  Procedure: Intubation Indications: Airway protection and maintenance  Procedure Details Consent: Risks of procedure as well as the alternatives and risks of each were explained to the (patient/caregiver).  Consent for procedure obtained. Time Out: Verified patient identification, verified procedure, site/side was marked, verified correct patient position, special equipment/implants available, medications/allergies/relevent history reviewed, required imaging and test results available.  Performed  3 Medications:  Fentanyl 100 mcg Etomidate 20 mg Versed 2 mg NMB    Evaluation Hemodynamic Status: BP stable throughout; O2 sats: stable throughout Patient's Current Condition: stable Complications: No apparent complications Patient did tolerate procedure well. Chest X-ray ordered to verify placement.  CXR: pending.   Richardson Landry Hance Caspers ACNP [facility] PCCM Pager [phone] till 3 pm If no answer page [phone] 09/04/2015, 12:07 PM

## 2015-09-04 NOTE — Progress Notes (Signed)
PCCM PROGRESS NOTE  ADMISSION DATE: 08/23/2015 CONSULT DATE: 08/30/2015 REFERRING PROVIDER: Dr. Roel Cluck, [facility]  CC: Short of breath  SUBJECTIVE: Anxious about procedure this afternoon.  VITAL SIGNS: BP 133/101 mmHg  Pulse 132  Temp(Src) 98.6 F (37 C) (Axillary)  Resp 31  Ht 5' 5.5" (1.664 m)  Wt 177 lb 7.5 oz (80.5 kg)  BMI 29.07 kg/m2  SpO2 95%  LMP 08/21/2015  INTAKE/OUTPUT: I/O last 3 completed shifts: In: 2620 [P.O.:1000; I.V.:370; IV Piggyback:1250] Out: 2550 [Urine:2550]  General: alert HEENT: wearing BiPAP Cardiac: regular, tachycardic Chest: decreased BS at bases, b/l expiratory wheezing Abd: non tender Ext: no edema Neuro: normal strength Skin: no rashes   CBC Recent Labs     09/03/15  0507  09/04/15  0405  09/04/15  0735  WBC  17.7*  17.5*  18.0*  HGB  10.7*  10.8*  10.8*  HCT  33.5*  34.1*  34.1*  PLT  416*  408*  406*    Coag's No results for input(s): APTT, INR in the last 72 hours.  BMET Recent Labs     09/02/15  0510  09/03/15  0507  09/04/15  0405  NA  135  134*  134*  K  3.5  3.2*  3.9  CL  95*  91*  91*  CO2  '27  27  27  '$ BUN  '17  19  20  '$ CREATININE  0.69  0.58  0.62  GLUCOSE  123*  106*  102*    Electrolytes Recent Labs     09/02/15  0510  09/03/15  0507  09/04/15  0405  CALCIUM  9.2  9.5  9.4  MG   --    --   2.2    Sepsis Markers No results for input(s): PROCALCITON, O2SATVEN in the last 72 hours.  Invalid input(s): LACTICACIDVEN  ABG No results for input(s): PHART, PCO2ART, PO2ART in the last 72 hours.  Liver Enzymes Recent Labs     09/02/15  0510  AST  63*  ALT  16  ALKPHOS  82  BILITOT  1.1  ALBUMIN  2.7*    Cardiac Enzymes No results for input(s): TROPONINI, PROBNP in the last 72 hours.  Glucose No results for input(s): GLUCAP in the last 72 hours.  Imaging Dg Chest Port 1 View  09/04/2015  CLINICAL DATA:  Respiratory failure/shortness of breath EXAM: PORTABLE CHEST 1 VIEW COMPARISON:   September 03, 2015 FINDINGS: Central catheter tip is in the superior vena cava. No pneumothorax. There is stable elevation right hemidiaphragm. There are multiple parenchymal lung mass lesions, essentially stable. There is underlying generalized interstitial prominence. No new opacity is seen. Heart size is normal. There is widespread adenopathy. No bone lesions. IMPRESSION: Multiple stable pulmonary nodular lesions consistent with metastatic disease. Diffuse interstitial prominence raises question of lymphangitic spread of tumor. Extensive adenopathy. Stable elevation right hemidiaphragm. No change in cardiac silhouette. Electronically Signed   By: Lowella Grip III M.D.   On: 09/04/2015 07:28   Dg Chest Port 1 View  09/03/2015  CLINICAL DATA:  Low oxygen saturations.  Ovarian cancer . EXAM: PORTABLE CHEST 1 VIEW COMPARISON:  09/01/2015.  CT 08/31/2015 . FINDINGS: Right PICC line stable position. mediastinum and hilar structures are normal. Heart size normal. Multifocal bilateral pulmonary i masses again noted. Small right pleural effusion. No pneumothorax. IMPRESSION: 1. Right PICC line in stable position. 2. Multifocal bilateral pulmonary mass lesions.  No interim change . Electronically Signed   By: Marcello Moores  Register   On: 09/03/2015 07:56    CULTURES: 1/13 Blood >> 1/14 Pneumococcal Ag >> negative 1/14 Legionella Ag >> negative 1/15 C diff PCR >> negative  ANTIBIOTICS: 1/13 Rocephin >> 1/13 1/13 Zithromax >> 1/13 1/14 Vancomycin >> 1/19 1/14 Zosyn >>   LINES/TUBES: 1/15 Rt PICC >>   STUDIES: 1/13 CT chest >> multiple b/l nodules, mass like consolidation Rt mid lung, Rt hilar and subcarinal mass, Lt hilar LAN, 2.7 cm Rt paratracheal LAN 1/14 CT abd/pelvis >> 10 cm mass superior to uterus 1/15 Tumor markers >> CA 19-9 453, CEA 3.9, CA 125 780.8 1/16 Bronchoscopy >> crush artifact ?small cell lung cancer (non diagnostic)  EVENTS: 1/13 Admit 1/15 Gyn oncology consulted 1/16  Bronchoscopy in ICU; Placed on BiPAP for wheezing, dyspnea 1/19 IR biopsy  DISCUSSION: 40 yo female with progressive dyspnea, wheezing.  She was found to have abnormal CT chest/abdomen/pelvis from metastatic cancer.  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  ASSESSMENT/PLAN:  PULMONARY A: Acute hypoxic respiratory failure 2nd to post-obstructive pneumonia, metastatic cancer, and wheeze from extrinsic airway compression. P: - will need intubation 1/19 prior to IR procedure - continue xopenex, pulmicort - oxygen to keep SpO2 > 92% - f/u CXR   CARDIAC A: Sinus tachycardia. P: - monitor hemodynamics  RENAL A: Hypokalemia >> improved. P: - f/u and replace as needed  GASTROENTEROLOGY A: Nutrition. P: - NPO for IR procedure - Protonix for SUP  HEMATOLOGY/ONCOLOGY A: Metastatic cancer. Anemia of critical illness. P: - for biopsy with IR 09/04/15 >> tentative plan for 2 pm >> if non diagnostic, then would need thoracic surgery to assess for mediastinoscopy - f/u with Gyn oncology  INFECTION A: Sepsis 2nd to post-obstructive pneumonia. P: - Day 7 of Abx per primary team >> d/c vancomycin 1/19  ENDOCRINE A: No acute issues. P: - monitor blood sugar on BMET  NEUROLOGY A: Cancer pain. P: - prn pain meds   Updated pt's family at bedside.  CC time 66mnutes.  VChesley Mires MD LUnited Medical Rehabilitation HospitalPulmonary/Critical Care 09/04/2015, 8:45 AM Pager:  [phone] 3pm call: [phone]

## 2015-09-04 NOTE — Progress Notes (Signed)
Pt taken off bipap and placed on 12L HFNC per MD request. RT will continue to monitor.

## 2015-09-05 ENCOUNTER — Inpatient Hospital Stay (HOSPITAL_COMMUNITY): Payer: BC Managed Care – PPO

## 2015-09-05 LAB — BASIC METABOLIC PANEL
ANION GAP: 16 — AB (ref 5–15)
BUN: 31 mg/dL — ABNORMAL HIGH (ref 6–20)
CHLORIDE: 97 mmol/L — AB (ref 101–111)
CO2: 27 mmol/L (ref 22–32)
Calcium: 9.7 mg/dL (ref 8.9–10.3)
Creatinine, Ser: 0.95 mg/dL (ref 0.44–1.00)
GFR calc Af Amer: >60 mL/min (ref >60)
Glucose, Bld: 101 mg/dL — ABNORMAL HIGH (ref 65–99)
POTASSIUM: 4.2 mmol/L (ref 3.5–5.1)
SODIUM: 140 mmol/L (ref 135–145)

## 2015-09-05 LAB — CBC
HEMATOCRIT: 34.7 % — AB (ref 36.0–46.0)
HEMOGLOBIN: 10.8 g/dL — AB (ref 12.0–15.0)
MCH: 26.7 pg (ref 26.0–34.0)
MCHC: 31.1 g/dL (ref 30.0–36.0)
MCV: 85.7 fL (ref 78.0–100.0)
Platelets: 408 10*3/uL — ABNORMAL HIGH (ref 150–400)
RBC: 4.05 MIL/uL (ref 3.87–5.11)
RDW: 14.2 % (ref 11.5–15.5)
WBC: 16.4 10*3/uL — AB (ref 4.0–10.5)

## 2015-09-05 LAB — BLOOD GAS, ARTERIAL
ACID-BASE EXCESS: 4.5 mmol/L — AB (ref 0.0–2.0)
Acid-Base Excess: 4.1 mmol/L — ABNORMAL HIGH (ref 0.0–2.0)
Acid-Base Excess: 4.5 mmol/L — ABNORMAL HIGH (ref 0.0–2.0)
BICARBONATE: 27 meq/L — AB (ref 20.0–24.0)
Bicarbonate: 28 mEq/L — ABNORMAL HIGH (ref 20.0–24.0)
Bicarbonate: 27.9 mEq/L — ABNORMAL HIGH (ref 20.0–24.0)
Drawn by: 235321
FIO2: 0.6
FIO2: 0.55
LHR: 24 {breaths}/min
LHR: 16 {breaths}/min
MECHVT: 500 mL
O2 SAT: 86.7 %
O2 Saturation: 98.8 %
O2 Saturation: 90 %
PATIENT TEMPERATURE: 98.6
PATIENT TEMPERATURE: 98.6
PCO2 ART: 41 mmHg (ref 35.0–45.0)
PCO2 ART: 33.4 mmHg — AB (ref 35.0–45.0)
PCO2 ART: 38.5 mmHg (ref 35.0–45.0)
PEEP/CPAP: 8 cmH2O
PEEP/CPAP: 5 cmH2O
PH ART: 7.449 (ref 7.350–7.450)
PO2 ART: 142 mmHg — AB (ref 80.0–100.0)
PO2 ART: 60.9 mmHg — AB (ref 80.0–100.0)
Patient temperature: 98.6
TCO2: 25.3 mmol/L (ref 0–100)
TCO2: 24.3 mmol/L (ref 0–100)
TCO2: 24.7 mmol/L (ref 0–100)
VT: 500 mL
pH, Arterial: 7.518 — ABNORMAL HIGH (ref 7.350–7.450)
pH, Arterial: 7.474 — ABNORMAL HIGH (ref 7.350–7.450)
pO2, Arterial: 52.4 mmHg — ABNORMAL LOW (ref 80.0–100.0)

## 2015-09-05 LAB — GLUCOSE, CAPILLARY: Glucose-Capillary: 108 mg/dL — ABNORMAL HIGH (ref 65–99)

## 2015-09-05 MED ORDER — VITAL HIGH PROTEIN PO LIQD
1000.0000 mL | ORAL | Status: DC
  Administered 2015-09-05 (×5)
  Administered 2015-09-05 – 2015-09-06 (×2): 1000 mL
  Administered 2015-09-06 (×8)
  Administered 2015-09-07 – 2015-09-08 (×3): 1000 mL
  Filled 2015-09-05 (×5): qty 1000

## 2015-09-05 MED ORDER — IBUPROFEN 100 MG/5ML PO SUSP
400.0000 mg | Freq: Once | ORAL | Status: AC
  Administered 2015-09-05: 400 mg
  Filled 2015-09-05: qty 20

## 2015-09-05 MED ORDER — PANTOPRAZOLE SODIUM 40 MG PO PACK
40.0000 mg | PACK | Freq: Every day | ORAL | Status: DC
  Administered 2015-09-05 – 2015-09-30 (×25): 40 mg
  Filled 2015-09-05 (×31): qty 20

## 2015-09-05 MED ORDER — VITAL HIGH PROTEIN PO LIQD
1000.0000 mL | ORAL | Status: DC
  Filled 2015-09-05: qty 1000

## 2015-09-05 NOTE — Progress Notes (Signed)
Nutrition Follow-up  INTERVENTION:  Initiate Vital High Protein @ 20 ml/hr via OG tube and increase by 10 ml every 4 hours to goal rate of 60 ml/hr.   Tube feeding regimen provides 1440 kcal, 126 grams of protein, and 1203 ml of H2O.  TF regimen and propofol at current rate providing 2078 total kcal/day (98 % of kcal needs)  NUTRITION DIAGNOSIS:   Inadequate oral intake related to inability to eat as evidenced by NPO status. Ongoing.   GOAL:   Patient will meet greater than or equal to 90% of their needs Not yet met.   MONITOR:   TF tolerance, Skin, Vent status, Labs, I & O's  REASON FOR ASSESSMENT:   Consult Enteral/tube feeding initiation and management  ASSESSMENT:   Presented with about the month worsens worsening shortness of breath and wheezing. She has been seen for this by her primary care provider diagnosed with pneumonia based on chest x-ray and completed antibiotics is not improvement repeat chest x-ray showed persistent pneumonia she had a total three courses of antibiotics Including z-pack, levaquin and clarithromycin. Suspect that she has a malignancy of sort, however, it is unclear what the primary is;  Multi-compartmental masses (chest, adenopathy, ovarian).   Prelim results from IR needle bx 09/04/15 >> concerning for neuroendocrine/small cell >> special stains pending to confirm. 1/19 pt intubated Pt sedated, no family in room.   Patient is currently intubated on ventilator support MV: 16.7 L/min Temp (24hrs), Avg:100.3 F (37.9 C), Min:99.7 F (37.6 C), Max:100.8 F (38.2 C)  Propofol: 24.2 ml/hr provides 638 kcal per day from lipid  Diet Order:  Diet NPO time specified  Skin:  Reviewed, no issues  Last BM:  1/18  Height:   Ht Readings from Last 1 Encounters:  08/30/15 5' 5.5" (1.664 m)   Weight:   Wt Readings from Last 1 Encounters:  09/05/15 177 lb 4 oz (80.4 kg)   Ideal Body Weight:  57.95 kg (kg)  BMI:  Body mass index is 29.04  kg/(m^2).  Estimated Nutritional Needs:   Kcal:  2111  Protein:  100-120 grams  Fluid:  >/= 2 L/day  EDUCATION NEEDS:   No education needs identified at this time  [place], [place], [place] Pager [phone] After Hours Pager

## 2015-09-05 NOTE — Progress Notes (Signed)
eLink Physician-Brief Progress Note Patient Name: Monique [name] DOB: Dec 08, 1975 MRN: [id number]   Date of Service  09/05/2015  HPI/Events of Note  Patient with fever of 102.4 despite tylenol.  Is on Zosyn.  HD stable but ongoing tachycardia.  WBC is decreasing.  All cultures negative so far.  Renal function is stable.  eICU Interventions  Plan: One time dose of Motrin 400 mg via tube Continue to monitor     Intervention Category Intermediate Interventions: Other:  Alucard Fearnow,[place] 09/05/2015, 10:16 PM

## 2015-09-05 NOTE — Progress Notes (Signed)
   09/05/15 1700  Clinical Encounter Type  Visited With Family  Visit Type Follow-up;Social support  Referral From Family  Consult/Referral To None  Spiritual Encounters  Spiritual Needs Emotional;Grief support  Stress Factors  Patient Stress Factors Not reviewed  Family Stress Factors Exhausted;Family relationships;Financial concerns;Lack of knowledge;Major life changes;Loss of control   Counseling intern provided emotional and social support to pt's husband using CBT and person-centered interventions. Pt's husband reported feelings of sadness, guilt, and anger related to his wife's current prognosis. Pt's husband processed feelings of sadness and loss; counselor provided grief counseling support.  Duffy Rhody Counseling Intern

## 2015-09-05 NOTE — Progress Notes (Signed)
PCCM PROGRESS NOTE  ADMISSION DATE: 09/11/2015 CONSULT DATE: 08/30/2015 REFERRING PROVIDER: Dr. Roel Cluck, [facility]  CC: Short of breath  SUBJECTIVE:  Husband anxious for results, hopeful to come off vent.    VITAL SIGNS: BP 122/82 mmHg  Pulse 123  Temp(Src) 100.6 F (38.1 C) (Axillary)  Resp 25  Ht 5' 5.5" (1.664 m)  Wt 177 lb 4 oz (80.4 kg)  BMI 29.04 kg/m2  SpO2 94%  LMP 08/18/2015  INTAKE/OUTPUT: I/O last 3 completed shifts: In: 1454 [I.V.:804; IV Piggyback:650] Out: 2150 [Urine:2150]  General: sedate on vent, no distress HEENT: ETT, no jvd Cardiac: regular, tachycardic Chest: even/non-labored, clear bilaterally, diminished lower, no wheeze Abd: non tender Ext: no edema Neuro: normal strength Skin: no rashes   CBC Recent Labs     09/04/15  0405  09/04/15  0735  09/05/15  0415  WBC  17.5*  18.0*  16.4*  HGB  10.8*  10.8*  10.8*  HCT  34.1*  34.1*  34.7*  PLT  408*  406*  408*    Coag's No results for input(s): APTT, INR in the last 72 hours.  BMET Recent Labs     09/03/15  0507  09/04/15  0405  09/05/15  0415  NA  134*  134*  140  K  3.2*  3.9  4.2  CL  91*  91*  97*  CO2  '27  27  27  '$ BUN  19  20  31*  CREATININE  0.58  0.62  0.95  GLUCOSE  106*  102*  101*    Electrolytes Recent Labs     09/03/15  0507  09/04/15  0405  09/05/15  0415  CALCIUM  9.5  9.4  9.7  MG   --   2.2   --     Sepsis Markers No results for input(s): PROCALCITON, O2SATVEN in the last 72 hours.  Invalid input(s): LACTICACIDVEN  ABG Recent Labs     09/04/15  1230  09/05/15  0356  PHART  7.449  7.518*  PCO2ART  41.0  33.4*  PO2ART  142*  52.4*    Liver Enzymes No results for input(s): AST, ALT, ALKPHOS, BILITOT, ALBUMIN in the last 72 hours.  Cardiac Enzymes No results for input(s): TROPONINI, PROBNP in the last 72 hours.  Glucose No results for input(s): GLUCAP in the last 72 hours.  Imaging Dg Abd 1 View  09/04/2015  CLINICAL DATA:  Orogastric tube  placement EXAM: ABDOMEN - 1 VIEW COMPARISON:  None. FINDINGS: There is normal small bowel gas pattern. Moderate gas noted with transverse colon. There is NG tube coiled within proximal stomach with tip in distal stomach. IMPRESSION: NG tube with tip in distal stomach. Moderate gas in transverse colon. Electronically Signed   By: Lahoma Crocker M.D.   On: 09/04/2015 15:39   Ct Biopsy  09/04/2015  CLINICAL DATA:  40 year old female with widespread malignancy of uncertain origin. Primary differential considerations include lung cancer, lymphoma and endometrial cancer. She presents for CT-guided biopsy to establish tissue diagnosis. EXAM: CT BIOPSY Date: 09/04/2015 PROCEDURE: CT-guided biopsy left retroperitoneal lymph node Interventional Radiologist:  Criselda Peaches, MD ANESTHESIA/SEDATION: None.  The patient is intubated and sedated at baseline. MEDICATIONS: None additional TECHNIQUE: Informed consent was obtained from the patient following explanation of the procedure, risks, benefits and alternatives. The patient understands, agrees and consents for the procedure. All questions were addressed. A time out was performed. A planning axial CT scan was performed. The left retroperitoneal  lymph node was successfully identified. A suitable skin entry site was selected and marked. The region was then sterilely prepped and draped in standard fashion using Betadine skin prep. Local anesthesia was attained by infiltration with 1% lidocaine. A small dermatotomy was made. Under intermittent CT fluoroscopic guidance, a 17 gauge trocar needle was advanced into the margin of the nodule. Multiple 18 gauge core biopsies were then coaxially obtained using the BioPince automated biopsy device. Biopsy specimens were placed in saline and delivered to pathology for further analysis. The biopsy device and introducer needle were removed. The patient tolerated the procedure well. COMPLICATIONS: None Estimated blood loss:  0 IMPRESSION:  Technically successful CT-guided biopsy left retroperitoneal lymph node. Signed, Criselda Peaches, MD [facility] Electronically Signed   By: Jacqulynn Cadet M.D.   On: 09/04/2015 15:38   Dg Chest Port 1 View  09/05/2015  CLINICAL DATA:  Respiratory failure.  Ventilator. EXAM: PORTABLE CHEST 1 VIEW COMPARISON:  09/04/2015 FINDINGS: Endotracheal tube in good position. NG tube in the stomach. Right arm PICC tip in the SVC Elevated right hemidiaphragm with right lower lobe airspace disease unchanged. Nodular lung densities are present bilaterally and unchanged. Mediastinal adenopathy unchanged. No superimposed pneumonia or heart failure. IMPRESSION: Support lines remain in good position Bilateral nodule airspace disease unchanged. Electronically Signed   By: Franchot Gallo M.D.   On: 09/05/2015 07:02   Dg Chest Port 1 View  09/04/2015  CLINICAL DATA:  Intubation, initial encounter EXAM: PORTABLE CHEST 1 VIEW COMPARISON:  09/04/2015 FINDINGS: Cardiomediastinal silhouette is stable. Again noted mediastinal and hilar adenopathy. Right middle lobe central mass is poorly visualized. Bilateral pulmonary nodules consistent with metastatic disease. There is right arm PICC line with tip in SVC. Endotracheal tube in place with tip 1.8 cm above the carina. No pulmonary edema. No pneumothorax IMPRESSION: Bilateral pulmonary nodules again noted. Poorly visualized right middle lobe central mass. Endotracheal tube in place. No pneumothorax. Stable right arm central line position. Electronically Signed   By: Lahoma Crocker M.D.   On: 09/04/2015 12:47   Dg Chest Port 1 View  09/04/2015  CLINICAL DATA:  Respiratory failure/shortness of breath EXAM: PORTABLE CHEST 1 VIEW COMPARISON:  September 03, 2015 FINDINGS: Central catheter tip is in the superior vena cava. No pneumothorax. There is stable elevation right hemidiaphragm. There are multiple parenchymal lung mass  lesions, essentially stable. There is underlying generalized interstitial prominence. No new opacity is seen. Heart size is normal. There is widespread adenopathy. No bone lesions. IMPRESSION: Multiple stable pulmonary nodular lesions consistent with metastatic disease. Diffuse interstitial prominence raises question of lymphangitic spread of tumor. Extensive adenopathy. Stable elevation right hemidiaphragm. No change in cardiac silhouette. Electronically Signed   By: Lowella Grip III M.D.   On: 09/04/2015 07:28    CULTURES: 1/13 Blood >> neg 1/14 Pneumococcal Ag >> negative 1/14 Legionella Ag >> negative 1/15 C diff PCR >> negative  ANTIBIOTICS: 1/13 Rocephin >> 1/13 1/13 Zithromax >> 1/13 1/14 Vancomycin >> 1/19 1/14 Zosyn >> 1/20  LINES/TUBES: 1/15 Rt PICC >>   STUDIES: 1/13 CT chest >> multiple b/l nodules, mass like consolidation Rt mid lung, Rt hilar and subcarinal mass, Lt hilar LAN, 2.7 cm Rt paratracheal LAN 1/14 CT abd/pelvis >> 10 cm mass superior to uterus 1/15 Tumor markers >> CA 19-9 453, CEA 3.9, CA 125 780.8 1/16 Bronchoscopy >> crush artifact ?small cell lung cancer (non diagnostic)  EVENTS: 1/13 Admit 1/15 Gyn oncology consulted 1/16 Bronchoscopy  in ICU; Placed on BiPAP for wheezing, dyspnea 1/19 IR biopsy  DISCUSSION: 40 yo female with progressive dyspnea, wheezing.  She was found to have abnormal CT chest/abdomen/pelvis from metastatic cancer.  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   ASSESSMENT/PLAN:  PULMONARY A: Acute hypoxic respiratory failure 2nd to post-obstructive pneumonia, metastatic cancer, and wheeze from extrinsic airway compression. Respiratory Alkalosis  P: - await pathology results prior to decision for extubation.  If path neg from CT bx, will need mediastinoscopy - continue  xopenex, pulmicort - adjust vent settings - f/u CXR  - discussed above decisions with Pathology, await return call   CARDIAC A: Sinus tachycardia. P: - monitor hemodynamics  RENAL A: Hypokalemia >> improved. Metabolic Alkalosis - post diuresis / hypercapnic P: - f/u and replace as needed  GASTROENTEROLOGY A: Nutrition. P: - NPO for IR procedure - Protonix for SUP  HEMATOLOGY/ONCOLOGY A: Metastatic cancer. Anemia of critical illness. P: - for biopsy with IR 09/04/15 >>if non diagnostic, then would need thoracic surgery to assess for mediastinoscopy - f/u with Gyn oncology  INFECTION A: Sepsis 2nd to post-obstructive pneumonia. P: - Day 7 of Abx >> d/c 1/20  ENDOCRINE A: No acute issues. P: - monitor blood glucose on BMET  NEUROLOGY A: Cancer pain. P: - Propofol for sedation  - fentanyl for pain    Updated pt's family at bedside.     Noe Gens, NP-C [place] Pgr: [phone] or if no answer [phone] 09/05/2015, 8:22 AM

## 2015-09-06 ENCOUNTER — Inpatient Hospital Stay (HOSPITAL_COMMUNITY): Payer: BC Managed Care – PPO

## 2015-09-06 LAB — GLUCOSE, CAPILLARY
GLUCOSE-CAPILLARY: 123 mg/dL — AB (ref 65–99)
GLUCOSE-CAPILLARY: 136 mg/dL — AB (ref 65–99)
GLUCOSE-CAPILLARY: 139 mg/dL — AB (ref 65–99)
Glucose-Capillary: 132 mg/dL — ABNORMAL HIGH (ref 65–99)
Glucose-Capillary: 103 mg/dL — ABNORMAL HIGH (ref 65–99)
Glucose-Capillary: 138 mg/dL — ABNORMAL HIGH (ref 65–99)

## 2015-09-06 LAB — URINALYSIS, ROUTINE W REFLEX MICROSCOPIC
BILIRUBIN URINE: NEGATIVE
GLUCOSE, UA: NEGATIVE mg/dL
KETONES UR: NEGATIVE mg/dL
Nitrite: NEGATIVE
PROTEIN: NEGATIVE mg/dL
Specific Gravity, Urine: 1.02 (ref 1.005–1.030)
pH: 5 (ref 5.0–8.0)

## 2015-09-06 LAB — CBC
HCT: 32.8 % — ABNORMAL LOW (ref 36.0–46.0)
Hemoglobin: 10.2 g/dL — ABNORMAL LOW (ref 12.0–15.0)
MCH: 26.4 pg (ref 26.0–34.0)
MCHC: 31.1 g/dL (ref 30.0–36.0)
MCV: 85 fL (ref 78.0–100.0)
Platelets: 347 10*3/uL (ref 150–400)
RBC: 3.86 MIL/uL — AB (ref 3.87–5.11)
RDW: 14.2 % (ref 11.5–15.5)
WBC: 21.1 10*3/uL — AB (ref 4.0–10.5)

## 2015-09-06 LAB — BASIC METABOLIC PANEL
Anion gap: 13 (ref 5–15)
BUN: 58 mg/dL — AB (ref 6–20)
CALCIUM: 9.1 mg/dL (ref 8.9–10.3)
CHLORIDE: 99 mmol/L — AB (ref 101–111)
CO2: 29 mmol/L (ref 22–32)
CREATININE: 1.7 mg/dL — AB (ref 0.44–1.00)
GFR calc non Af Amer: 37 mL/min — ABNORMAL LOW (ref >60)
GFR, EST AFRICAN AMERICAN: 42 mL/min — AB (ref >60)
Glucose, Bld: 144 mg/dL — ABNORMAL HIGH (ref 65–99)
Potassium: 4 mmol/L (ref 3.5–5.1)
SODIUM: 141 mmol/L (ref 135–145)

## 2015-09-06 LAB — URINE MICROSCOPIC-ADD ON

## 2015-09-06 LAB — LACTIC ACID, PLASMA: Lactic Acid, Venous: 3.2 mmol/L (ref 0.5–2.0)

## 2015-09-06 LAB — PROCALCITONIN: Procalcitonin: 91.04 ng/mL

## 2015-09-06 MED ORDER — ANTISEPTIC ORAL RINSE SOLUTION (CORINZ)
7.0000 mL | Freq: Four times a day (QID) | OROMUCOSAL | Status: DC
  Administered 2015-09-06 – 2015-09-30 (×89): 7 mL via OROMUCOSAL

## 2015-09-06 MED ORDER — CEFTAZIDIME 2 G IJ SOLR
2.0000 g | Freq: Two times a day (BID) | INTRAMUSCULAR | Status: DC
  Administered 2015-09-06 – 2015-09-09 (×6): 2 g via INTRAVENOUS
  Filled 2015-09-06 (×7): qty 2

## 2015-09-06 MED ORDER — SODIUM CHLORIDE 0.9 % IV BOLUS (SEPSIS)
500.0000 mL | Freq: Once | INTRAVENOUS | Status: AC
  Administered 2015-09-06: 500 mL via INTRAVENOUS

## 2015-09-06 MED ORDER — CHLORHEXIDINE GLUCONATE 0.12% ORAL RINSE (MEDLINE KIT)
15.0000 mL | Freq: Two times a day (BID) | OROMUCOSAL | Status: DC
  Administered 2015-09-06 – 2015-09-18 (×24): 15 mL via OROMUCOSAL

## 2015-09-06 MED ORDER — IBUPROFEN 100 MG/5ML PO SUSP
400.0000 mg | Freq: Once | ORAL | Status: AC
  Administered 2015-09-06: 400 mg via ORAL
  Filled 2015-09-06: qty 20

## 2015-09-06 MED ORDER — VANCOMYCIN HCL IN DEXTROSE 750-5 MG/150ML-% IV SOLN
750.0000 mg | Freq: Two times a day (BID) | INTRAVENOUS | Status: DC
  Administered 2015-09-07 – 2015-09-09 (×5): 750 mg via INTRAVENOUS
  Filled 2015-09-06 (×6): qty 150

## 2015-09-06 MED ORDER — SODIUM CHLORIDE 0.9 % IV SOLN
INTRAVENOUS | Status: DC
  Administered 2015-09-06: 22:00:00 via INTRAVENOUS

## 2015-09-06 MED ORDER — VANCOMYCIN HCL IN DEXTROSE 1-5 GM/200ML-% IV SOLN
1000.0000 mg | Freq: Once | INTRAVENOUS | Status: AC
  Administered 2015-09-06: 1000 mg via INTRAVENOUS
  Filled 2015-09-06: qty 200

## 2015-09-06 NOTE — Progress Notes (Signed)
ANTIBIOTIC CONSULT NOTE - INITIAL  Pharmacy Consult for [facility] and Vancomycin Indication: Sepsis  No Known Allergies  Patient Measurements: Height: 5' 5.5" (166.4 cm) Weight: 174 lb 9.7 oz (79.2 kg) IBW/kg (Calculated) : 58.15  Vital Signs: Temp: 102.7 F (39.3 C) (01/21 1200) Temp Source: Oral (01/21 1200) BP: 113/72 mmHg (01/21 1400) Pulse Rate: 126 (01/21 0409) Intake/Output from previous day: 01/20 0701 - 01/21 0700 In: 1697.8 [I.V.:1063.8; NG/GT:534; IV Piggyback:100] Out: 895 [Urine:895] Intake/Output from this shift: Total I/O In: 1440.8 [I.V.:968; NG/GT:472.8] Out: 575 [Urine:575]  Labs:  Recent Labs  09/04/15 0405 09/04/15 0735 09/05/15 0415 09/06/15 0420  WBC 17.5* 18.0* 16.4* 21.1*  HGB 10.8* 10.8* 10.8* 10.2*  PLT 408* 406* 408* 347  CREATININE 0.62  --  0.95 1.70*   Estimated Creatinine Clearance: 46.3 mL/min (by C-G formula based on Cr of 1.7). No results for input(s): VANCOTROUGH, VANCOPEAK, VANCORANDOM, GENTTROUGH, GENTPEAK, GENTRANDOM, TOBRATROUGH, TOBRAPEAK, TOBRARND, AMIKACINPEAK, AMIKACINTROU, AMIKACIN in the last 72 hours.   Microbiology: Recent Results (from the past 720 hour(s))  Culture, blood (Routine X 2) w Reflex to ID Panel     Status: None   Collection Time: 09/10/2015  5:40 PM  Result Value Ref Range Status   Specimen Description BLOOD LEFT ANTECUBITAL  Final   Special Requests BOTTLES DRAWN AEROBIC ONLY [place]  Final   Culture   Final    NO GROWTH 5 DAYS Performed at [facility]    Report Status 09/03/2015 FINAL  Final  Culture, blood (Routine X 2) w Reflex to ID Panel     Status: None   Collection Time: 09/16/2015  5:45 PM  Result Value Ref Range Status   Specimen Description BLOOD LEFT ANTECUBITAL  Final   Special Requests BOTTLES DRAWN AEROBIC AND ANAEROBIC [place]  Final   Culture   Final    NO GROWTH 5 DAYS Performed at [facility]    Report Status 09/03/2015 FINAL  Final  MRSA PCR Screening     Status: None   Collection Time: 08/30/15  2:30 PM  Result Value Ref Range Status   MRSA by PCR NEGATIVE NEGATIVE Final    Comment:        The GeneXpert MRSA Assay (FDA approved for NASAL specimens only), is one component of a comprehensive MRSA colonization surveillance program. It is not intended to diagnose MRSA infection nor to guide or monitor treatment for MRSA infections.   C difficile quick scan w PCR reflex     Status: None   Collection Time: 08/31/15 12:38 PM  Result Value Ref Range Status   C Diff antigen NEGATIVE NEGATIVE Final   C Diff toxin NEGATIVE NEGATIVE Final   C Diff interpretation Negative for toxigenic C. difficile  Final    Medical History: History reviewed. No pertinent past medical history.  Medications:  Anti-infectives    Start     Dose/Rate Route Frequency Ordered Stop   09/07/15 0400  vancomycin (VANCOCIN) IVPB 750 mg/150 ml premix     750 mg 150 mL/hr over 60 Minutes Intravenous Every 12 hours 09/06/15 1435     09/06/15 1530  vancomycin (VANCOCIN) IVPB 1000 mg/200 mL premix     1,000 mg 200 mL/hr over 60 Minutes Intravenous  Once 09/06/15 1435     09/06/15 1500  cefTAZidime (FORTAZ) 2 g in dextrose 5 % 50 mL IVPB     2 g 100 mL/hr over 30 Minutes Intravenous Every 12 hours 09/06/15 1435     08/30/15  1200  vancomycin (VANCOCIN) IVPB 1000 mg/200 mL premix  Status:  Discontinued     1,000 mg 200 mL/hr over 60 Minutes Intravenous Every 8 hours 08/30/15 1036 09/04/15 0854   08/30/15 1200  piperacillin-tazobactam (ZOSYN) IVPB 3.375 g     3.375 g 12.5 mL/hr over 240 Minutes Intravenous Every 8 hours 08/30/15 1036 09/06/15 1022   08/28/2015 1945  cefTRIAXone (ROCEPHIN) 1 g in dextrose 5 % 50 mL IVPB     1 g 100 mL/hr over 30 Minutes Intravenous  Once 08/25/2015 1936 09/14/2015 2109   08/28/2015 1945  azithromycin (ZITHROMAX) 500 mg in dextrose 5 % 250 mL IVPB     500 mg 250 mL/hr over 60 Minutes Intravenous  Once 09/15/2015 1936 08/30/15 0027     Assessment: 40yo F  patient known to pharmacy. Taken off abx on 1/20 then on 1/21 developed fever of 102.7, WBCs trended up, PCT and LA markedly elevated. SCr increased signif, CrCl ~46. Pharmacy asked to resume abx with Tressie Ellis and Vanc for sepsis.  Goal of Therapy:  Vancomycin trough level 15-20 mcg/ml  Appropriate antibiotic dosing for renal function; eradication of infection  Plan:  Vancomycin 1g now then '750mg'$  IV q12h. Fortaz 2g IV q12h.  Measure Vanc trough at steady state. Follow up renal fxn, culture results, and clinical course.  Romeo Rabon, PharmD, pager [phone]. 09/06/2015,2:44 PM.

## 2015-09-06 NOTE — Progress Notes (Signed)
Sinus tachycardia increased this PM up to the 150's. Dr. Oletta Darter notified at [facility].

## 2015-09-06 NOTE — Progress Notes (Addendum)
PCCM PROGRESS NOTE  ADMISSION DATE: 09/10/2015 CONSULT DATE: 08/30/2015 REFERRING PROVIDER: Dr. Roel Cluck, [facility]  CC: Short of breath  SUBJECTIVE:  Husband anxious for results, hopeful to come off vent.    VITAL SIGNS: BP 96/60 mmHg  Pulse 126  Temp(Src) 98.3 F (36.8 C) (Oral)  Resp 13  Ht 5' 5.5" (1.664 m)  Wt 174 lb 9.7 oz (79.2 kg)  BMI 28.60 kg/m2  SpO2 93%  LMP 09/04/2015  INTAKE/OUTPUT: I/O last 3 completed shifts: In: 2148.2 [I.V.:1414.2; NG/GT:534; IV Piggyback:200] Out: 8299 [Urine:1420]  General: Sedated, on vent. Mild distress HEENT: ETT, No JVD Cardiac: regular, No MRG Chest: B/L rhonchi Abd: Distended. + BS Ext: 1+ edema Neuro: normal strength Skin: no rashes  CBC Recent Labs     09/04/15  0735  09/05/15  0415  09/06/15  0420  WBC  18.0*  16.4*  21.1*  HGB  10.8*  10.8*  10.2*  HCT  34.1*  34.7*  32.8*  PLT  406*  408*  347    Coag's No results for input(s): APTT, INR in the last 72 hours.  BMET Recent Labs     09/04/15  0405  09/05/15  0415  09/06/15  0420  NA  134*  140  141  K  3.9  4.2  4.0  CL  91*  97*  99*  CO2  '27  27  29  '$ BUN  20  31*  58*  CREATININE  0.62  0.95  1.70*  GLUCOSE  102*  101*  144*    Electrolytes Recent Labs     09/04/15  0405  09/05/15  0415  09/06/15  0420  CALCIUM  9.4  9.7  9.1  MG  2.2   --    --     Sepsis Markers No results for input(s): PROCALCITON, O2SATVEN in the last 72 hours.  Invalid input(s): LACTICACIDVEN  ABG Recent Labs     09/04/15  1230  09/05/15  0356  09/05/15  1530  PHART  7.449  7.518*  7.474*  PCO2ART  41.0  33.4*  38.5  PO2ART  142*  52.4*  60.9*    Liver Enzymes No results for input(s): AST, ALT, ALKPHOS, BILITOT, ALBUMIN in the last 72 hours.  Cardiac Enzymes No results for input(s): TROPONINI, PROBNP in the last 72 hours.  Glucose Recent Labs     09/05/15  1543  09/05/15  2006  09/06/15  0041  09/06/15  0420  GLUCAP  108*  123*  132*  103*     Imaging Dg Abd 1 View  09/04/2015  CLINICAL DATA:  Orogastric tube placement EXAM: ABDOMEN - 1 VIEW COMPARISON:  None. FINDINGS: There is normal small bowel gas pattern. Moderate gas noted with transverse colon. There is NG tube coiled within proximal stomach with tip in distal stomach. IMPRESSION: NG tube with tip in distal stomach. Moderate gas in transverse colon. Electronically Signed   By: Lahoma Crocker M.D.   On: 09/04/2015 15:39   Ct Biopsy  09/04/2015  CLINICAL DATA:  40 year old female with widespread malignancy of uncertain origin. Primary differential considerations include lung cancer, lymphoma and endometrial cancer. She presents for CT-guided biopsy to establish tissue diagnosis. EXAM: CT BIOPSY Date: 09/04/2015 PROCEDURE: CT-guided biopsy left retroperitoneal lymph node Interventional Radiologist:  Criselda Peaches, MD ANESTHESIA/SEDATION: None.  The patient is intubated and sedated at baseline. MEDICATIONS: None additional TECHNIQUE: Informed consent was obtained from the patient following explanation of the procedure, risks, benefits  and alternatives. The patient understands, agrees and consents for the procedure. All questions were addressed. A time out was performed. A planning axial CT scan was performed. The left retroperitoneal lymph node was successfully identified. A suitable skin entry site was selected and marked. The region was then sterilely prepped and draped in standard fashion using Betadine skin prep. Local anesthesia was attained by infiltration with 1% lidocaine. A small dermatotomy was made. Under intermittent CT fluoroscopic guidance, a 17 gauge trocar needle was advanced into the margin of the nodule. Multiple 18 gauge core biopsies were then coaxially obtained using the BioPince automated biopsy device. Biopsy specimens were placed in saline and delivered to pathology for further analysis. The biopsy device and introducer needle were removed. The patient tolerated the  procedure well. COMPLICATIONS: None Estimated blood loss:  0 IMPRESSION: Technically successful CT-guided biopsy left retroperitoneal lymph node. Signed, Criselda Peaches, MD [facility] Electronically Signed   By: Jacqulynn Cadet M.D.   On: 09/04/2015 15:38   Dg Chest Port 1 View  09/06/2015  CLINICAL DATA:  Respiratory failure. EXAM: PORTABLE CHEST 1 VIEW COMPARISON:  09/05/2015 FINDINGS: Endotracheal tube, enteric catheter, right IJ approach central venous catheter are stable. Cardiomediastinal silhouette is normal. Mediastinal contours appear intact. There is no evidence of pneumothorax. There are stable bilateral pulmonary masses. The right hemidiaphragm is obscured likely by previously demonstrated right middle lobe mass. Associated right middle and lower lobe atelectasis or postobstructive airspace consolidation cannot be excluded. Osseous structures are without acute abnormality. Soft tissues are grossly normal. IMPRESSION: Stable appearance of the chest with bilateral pulmonary masses, including large right middle lobe mass with likely right middle and lower lobe post obstructive atelectasis or airspace consolidation. Stable support apparatus. Electronically Signed   By: Fidela Salisbury M.D.   On: 09/06/2015 09:33   Dg Chest Port 1 View  09/05/2015  CLINICAL DATA:  Respiratory failure.  Ventilator. EXAM: PORTABLE CHEST 1 VIEW COMPARISON:  09/04/2015 FINDINGS: Endotracheal tube in good position. NG tube in the stomach. Right arm PICC tip in the SVC Elevated right hemidiaphragm with right lower lobe airspace disease unchanged. Nodular lung densities are present bilaterally and unchanged. Mediastinal adenopathy unchanged. No superimposed pneumonia or heart failure. IMPRESSION: Support lines remain in good position Bilateral nodule airspace disease unchanged. Electronically Signed   By: Franchot Gallo M.D.   On: 09/05/2015 07:02   Dg  Chest Port 1 View  09/04/2015  CLINICAL DATA:  Intubation, initial encounter EXAM: PORTABLE CHEST 1 VIEW COMPARISON:  09/04/2015 FINDINGS: Cardiomediastinal silhouette is stable. Again noted mediastinal and hilar adenopathy. Right middle lobe central mass is poorly visualized. Bilateral pulmonary nodules consistent with metastatic disease. There is right arm PICC line with tip in SVC. Endotracheal tube in place with tip 1.8 cm above the carina. No pulmonary edema. No pneumothorax IMPRESSION: Bilateral pulmonary nodules again noted. Poorly visualized right middle lobe central mass. Endotracheal tube in place. No pneumothorax. Stable right arm central line position. Electronically Signed   By: Lahoma Crocker M.D.   On: 09/04/2015 12:47    CULTURES: 1/13 Blood >> neg 1/14 Pneumococcal Ag >> negative 1/14 Legionella Ag >> negative 1/15 C diff PCR >> negative  ANTIBIOTICS: 1/13 Rocephin >> 1/13 1/13 Zithromax >> 1/13 1/14 Vancomycin >> 1/19 1/14 Zosyn >> 1/20  LINES/TUBES: 1/15 Rt PICC >>   STUDIES: 1/13 CT chest >> multiple b/l nodules, mass like consolidation Rt mid lung, Rt hilar and subcarinal mass, Lt hilar LAN,  2.7 cm Rt paratracheal LAN 1/14 CT abd/pelvis >> 10 cm mass superior to uterus 1/15 Tumor markers >> CA 19-9 453, CEA 3.9, CA 125 780.8 1/16 Bronchoscopy >> crush artifact ?small cell lung cancer (non diagnostic)  EVENTS: 1/13 Admit 1/15 Gyn oncology consulted 1/16 Bronchoscopy in ICU; Placed on BiPAP for wheezing, dyspnea 1/19 IR biopsy  DISCUSSION: 41 yo female with progressive dyspnea, wheezing.  She was found to have abnormal CT chest/abdomen/pelvis from metastatic cancer.  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   ASSESSMENT/PLAN:  PULMONARY A: Acute hypoxic respiratory failure 2nd to post-obstructive pneumonia,  metastatic cancer, and wheeze from extrinsic airway compression. Respiratory Alkalosis  P: - await pathology results prior to decision for extubation.  If path neg from CT bx, will need mediastinoscopy. - continue xopenex, pulmicort  CARDIAC A: Sinus tachycardia. P: - Monitor hemodynamics. - 500cc IV bolus. Start NS at 100 cc/hr - Check lactic acid.  RENAL A: Hypokalemia >> improved. Metabolic Alkalosis - post diuresis / hypercapnic. Now with AKI. Falling urine output P: IVF hydration as above. Monitor urine output and Cr Renal u/s to check for hydro  GASTROENTEROLOGY A: Nutrition. P: - Tube feeds - Protonix for SUP  HEMATOLOGY/ONCOLOGY A: Metastatic cancer. Anemia of critical illness. P: - s/p  biopsy with IR 09/04/15 >>if non diagnostic, then would need thoracic surgery to assess for mediastinoscopy - f/u with Gyn oncology  INFECTION A: Sepsis 2nd to post-obstructive pneumonia. P: - Abx >> d/c 1/20. Received 7 days. - Now with new fevers. Will check cultures and procalcitonin.  ENDOCRINE A: No acute issues. P: - monitor blood glucose on BMET  NEUROLOGY A: Cancer pain. P: - Propofol for sedation  - fentanyl for pain   Husband updated at bedside.  Critical care time- 35 mins  Marshell Garfinkel MD [place] [facility] Pager [phone] If no answer or after 3pm call: [phone] 09/06/2015, 11:26 AM

## 2015-09-06 NOTE — Progress Notes (Signed)
[facility]Link Physician-Brief Progress Note Patient Name: Monique [name] DOB: 01/20/1976 MRN: [id number]   Date of Service  09/06/2015  HPI/Events of Note  Sinus Tachycardia - HR = 153. Fever to 102.7 F. earlier today >> now down to 101.0 F. Creatinine = 1.70.  eICU Interventions  Will order: 1. Motrin Suspension 400 mg per tube X 1 now.  2. 0.9 NaCl 500 mL IV over 30 minutes now.      Intervention Category Intermediate Interventions: Arrhythmia - evaluation and management  Sommer,Steven Eugene 09/06/2015, 6:54 PM

## 2015-09-06 NOTE — Progress Notes (Addendum)
CRITICAL VALUE ALERT  Critical value received:  Lactic acid 3.2  Date of notification:  09-06-2015  Time of notification:  1312  Critical value read back:  yes  Nurse who received alert:  Jerrel Ivory, RN  MD notified (1st page):  Dr. Vaughan Browner  Time of first page:  1412  MD notified (2nd page):  Time of second page:  Responding MD:  Dr. Vaughan Browner   Time MD responded:  [phone]

## 2015-09-06 NOTE — Progress Notes (Signed)
[place] physician (Dr. Jimmy Footman) made aware of decrease urine output over past 2 hours and morning lab results. Order given for 5102m NS bolus. Will continue to monitor.

## 2015-09-06 NOTE — Progress Notes (Signed)
Dr. Jimmy Footman informed of increased temperature 102.4 @ 2200. Order given for one time dose of motrin. Will continue to monitor.

## 2015-09-07 ENCOUNTER — Inpatient Hospital Stay (HOSPITAL_COMMUNITY): Payer: BC Managed Care – PPO

## 2015-09-07 LAB — BASIC METABOLIC PANEL
ANION GAP: 10 (ref 5–15)
BUN: 66 mg/dL — AB (ref 6–20)
CO2: 27 mmol/L (ref 22–32)
Calcium: 8.8 mg/dL — ABNORMAL LOW (ref 8.9–10.3)
Chloride: 111 mmol/L (ref 101–111)
Creatinine, Ser: 1.44 mg/dL — ABNORMAL HIGH (ref 0.44–1.00)
GFR calc Af Amer: 52 mL/min — ABNORMAL LOW (ref >60)
GFR calc non Af Amer: 45 mL/min — ABNORMAL LOW (ref >60)
GLUCOSE: 150 mg/dL — AB (ref 65–99)
POTASSIUM: 3.9 mmol/L (ref 3.5–5.1)
Sodium: 148 mmol/L — ABNORMAL HIGH (ref 135–145)

## 2015-09-07 LAB — TROPONIN I
TROPONIN I: 0.09 ng/mL — AB (ref <0.031)
Troponin I: 0.09 ng/mL — ABNORMAL HIGH (ref <0.031)

## 2015-09-07 LAB — GLUCOSE, CAPILLARY
GLUCOSE-CAPILLARY: 129 mg/dL — AB (ref 65–99)
GLUCOSE-CAPILLARY: 129 mg/dL — AB (ref 65–99)
GLUCOSE-CAPILLARY: 143 mg/dL — AB (ref 65–99)
Glucose-Capillary: 143 mg/dL — ABNORMAL HIGH (ref 65–99)
Glucose-Capillary: 366 mg/dL — ABNORMAL HIGH (ref 65–99)
Glucose-Capillary: 137 mg/dL — ABNORMAL HIGH (ref 65–99)
Glucose-Capillary: 125 mg/dL — ABNORMAL HIGH (ref 65–99)

## 2015-09-07 LAB — HEPATIC FUNCTION PANEL
ALBUMIN: 1.7 g/dL — AB (ref 3.5–5.0)
ALK PHOS: 131 U/L — AB (ref 38–126)
ALT: 23 U/L (ref 14–54)
AST: 128 U/L — ABNORMAL HIGH (ref 15–41)
BILIRUBIN TOTAL: 1.5 mg/dL — AB (ref 0.3–1.2)
Bilirubin, Direct: 0.9 mg/dL — ABNORMAL HIGH (ref 0.1–0.5)
Indirect Bilirubin: 0.6 mg/dL (ref 0.3–0.9)
TOTAL PROTEIN: 5.8 g/dL — AB (ref 6.5–8.1)

## 2015-09-07 LAB — BLOOD GAS, ARTERIAL
Acid-Base Excess: 0.4 mmol/L (ref 0.0–2.0)
Bicarbonate: 25.9 mEq/L — ABNORMAL HIGH (ref 20.0–24.0)
DRAWN BY: 295031
FIO2: 1
O2 SAT: 75 %
PO2 ART: 51 mmHg — AB (ref 80.0–100.0)
Patient temperature: 103
TCO2: 24.5 mmol/L (ref 0–100)
pCO2 arterial: 55 mmHg — ABNORMAL HIGH (ref 35.0–45.0)
pH, Arterial: 7.309 — ABNORMAL LOW (ref 7.350–7.450)

## 2015-09-07 LAB — CBC
HEMATOCRIT: 31 % — AB (ref 36.0–46.0)
Hemoglobin: 9.3 g/dL — ABNORMAL LOW (ref 12.0–15.0)
MCH: 26.6 pg (ref 26.0–34.0)
MCHC: 30 g/dL (ref 30.0–36.0)
MCV: 88.6 fL (ref 78.0–100.0)
PLATELETS: 308 10*3/uL (ref 150–400)
RBC: 3.5 MIL/uL — AB (ref 3.87–5.11)
RDW: 15 % (ref 11.5–15.5)
WBC: 20 10*3/uL — AB (ref 4.0–10.5)

## 2015-09-07 LAB — LACTIC ACID, PLASMA
Lactic Acid, Venous: 2.2 mmol/L (ref 0.5–2.0)
Lactic Acid, Venous: 2.6 mmol/L (ref 0.5–2.0)

## 2015-09-07 LAB — PROCALCITONIN: Procalcitonin: 81.96 ng/mL

## 2015-09-07 LAB — AMYLASE: AMYLASE: 99 U/L (ref 28–100)

## 2015-09-07 LAB — LIPASE, BLOOD: Lipase: 134 U/L — ABNORMAL HIGH (ref 11–51)

## 2015-09-07 LAB — TRIGLYCERIDES: TRIGLYCERIDES: 319 mg/dL — AB (ref <150)

## 2015-09-07 MED ORDER — ACETAMINOPHEN 160 MG/5ML PO SOLN
650.0000 mg | Freq: Four times a day (QID) | ORAL | Status: DC | PRN
  Administered 2015-09-07 – 2015-09-11 (×6): 650 mg via ORAL
  Filled 2015-09-07 (×6): qty 20.3

## 2015-09-07 MED ORDER — MIDAZOLAM HCL 2 MG/2ML IJ SOLN
INTRAMUSCULAR | Status: AC
  Filled 2015-09-07: qty 2

## 2015-09-07 MED ORDER — VITAMINS A & D EX OINT
TOPICAL_OINTMENT | CUTANEOUS | Status: AC
  Filled 2015-09-07: qty 5

## 2015-09-07 MED ORDER — FENTANYL CITRATE (PF) 100 MCG/2ML IJ SOLN
INTRAMUSCULAR | Status: AC
  Filled 2015-09-07: qty 2

## 2015-09-07 NOTE — Progress Notes (Signed)
Patient not following commands, sedation (propofol and fentanyl) paused for over 30 minutes. Withdrawal from pain observed on left side extremities to painful stimuli, no movement observed on right side extremities to painful stimuli. Pupils 3 reactive/sluggish. Dr. Vaughan Browner made aware of neurological findings, order written for STAT CT of head.

## 2015-09-07 NOTE — Progress Notes (Signed)
Pt temp 103 after '650mg'$  tylenol. [facility] notified. Orders for cold therapy given. Ice packs placed under arms and groin. Will continue to monitor.

## 2015-09-07 NOTE — Procedures (Signed)
PCCM Bronchoscopy Procedure Note  The patient was informed of the risks (including but not limited to bleeding, infection, respiratory failure, lung injury, tooth/oral injury) and benefits of the procedure and gave consent, see chart.  Indication: Hypoxia, resp failure  Post Procedure Diagnosis: RLL pneumonia, metastatic cancer  Location: RLL  Condition pre procedure: Unstable, hypoxic requiring bagging  Medications for procedure: Fentanyl, versed  Procedure description: The bronchoscope was introduced through the endotracheal tube the bilateral lungs to the level of the subsegmental bronchi throughout the tracheobronchial tree.  Airway exam revealed no endobronchial lesions. Narrowing of the bronchus intermedius by extrinsic compression  Procedures performed: Bronchial washing and lavage, thick tenacious mucus lavaged from the RML and RLL.  Specimens sent: Cultures  Condition post procedure: Stable  EBL: 0 ml  Complications: None.  Marshell Garfinkel MD [place] [facility] Pager [phone] If no answer or after 3pm call: [phone] 09/07/2015, 3:58 PM

## 2015-09-07 NOTE — Progress Notes (Signed)
Monique [name] continues to deteriorate with persistent tachycardia, hypoxia and fevers. We had to bag her for 45 mins today afternoon for sats on 60-80s. She underwent emergent bronch to clear out RML and RLL of mucus plugs. I spoke with the husband and told him that the prognosis is looking increasingly grim and she may pass away today. He agreed to make her DNR and if she should deteriorate further then make her comfort care only. He expressed regrets that he could not have any last words with her but is accepting of the situation.

## 2015-09-07 NOTE — Progress Notes (Signed)
CRITICAL VALUE ALERT  Critical value received:  Lactic acid 2.2  Date of notification:  09/07/2015  Time of notification:  0530  Critical value read back:Yes.    Nurse who received alert:  Odis Hollingshead RN  MD notified (1st page): Elink  Time of first page:  0535  MD notified (2nd page):  Time of second page:  Responding MD:  Warren Lacy  Time MD responded:  [phone]

## 2015-09-07 NOTE — Progress Notes (Addendum)
   09/07/15 1424  Vitals  ECG Heart Rate (!) 149  Resp (!) 29  Oxygen Therapy  SpO2 (!) 80 %   Dr. Vaughan Browner made aware of desaturation after turning patient to be cleaned up after bowel movement. Patient bagged and lavaged by respiratory therapist 4 times without any improvement in saturations. Diminished breath sounds bilateral. Order for stat chest xray and ABG in progress. Dr. Vaughan Browner on the way to assess patient. Mrs. Snowball was bagged for about 45 minutes before saturations improved and sustained greater than 92%. Patient husband, Gerald Stabs at bedside and updated throughout event.

## 2015-09-07 NOTE — Progress Notes (Signed)
Pt hyperthermic, ice packs already in place.  Primary nurse notified [facility] was for further treatment options.  Dr. Oletta Darter (E-link) advised: have RT turn temp down on ventilator; use cooling blanket; use fans; do ice water lavages down OGT.    Primary nurse advised me of above.    I called Dr. Oletta Darter back and advised:  Per RT we are unable to control temperature with our ventilators; [place] does not have a cooling blanket and per a research study done approx. 6 years ago by the Critical Care Nurse Educator, cooling blankets (other than [place] Sun) should not be used systemwide; portable fans already in place; and that we would add the ice lavages per his orders.  I recommended changing Tylenol from PO/PR to IV and he advised that wasn't needed.  No further recommendations given by Dr. Oletta Darter.  He advised that we are doing all that we can do.  [place] Coordinator [facility] ICU/SD

## 2015-09-07 NOTE — Progress Notes (Signed)
PCCM PROGRESS NOTE  ADMISSION DATE: 08/19/2015 CONSULT DATE: 08/30/2015 REFERRING PROVIDER: Dr. Roel Cluck, [facility]  CC: Short of breath  SUBJECTIVE:  Continues to be febrile with sinus tachycardia. Hypoxia requiring higher FiO2.   VITAL SIGNS: BP 124/76 mmHg  Pulse 147  Temp(Src) 99.7 F (37.6 C) (Axillary)  Resp 19  Ht 5' 5.5" (1.664 m)  Wt 181 lb 7 oz (82.3 kg)  BMI 29.72 kg/m2  SpO2 89%  LMP 09/02/2015  INTAKE/OUTPUT: I/O last 3 completed shifts: In: 7564 [I.V.:3498.2; Other:500; NG/GT:1972.8; IV Piggyback:500] Out: 2245 [Urine:2245]  General: Sedated, on vent. Mild distress HEENT: ETT, No JVD Cardiac: regular, No MRG Chest: B/L rhonchi Abd: Distended. + BS Ext: 1+ edema Neuro: normal strength Skin: no rashes  CBC Recent Labs     09/05/15  0415  09/06/15  0420  WBC  16.4*  21.1*  HGB  10.8*  10.2*  HCT  34.7*  32.8*  PLT  408*  347    Coag's No results for input(s): APTT, INR in the last 72 hours.  BMET Recent Labs     09/05/15  0415  09/06/15  0420  NA  140  141  K  4.2  4.0  CL  97*  99*  CO2  27  29  BUN  31*  58*  CREATININE  0.95  1.70*  GLUCOSE  101*  144*    Electrolytes Recent Labs     09/05/15  0415  09/06/15  0420  CALCIUM  9.7  9.1    Sepsis Markers Recent Labs     09/06/15  1205  09/07/15  0430  PROCALCITON  91.04  81.96    ABG Recent Labs     09/04/15  1230  09/05/15  0356  09/05/15  1530  PHART  7.449  7.518*  7.474*  PCO2ART  41.0  33.4*  38.5  PO2ART  142*  52.4*  60.9*    Liver Enzymes Recent Labs     09/07/15  0430  AST  128*  ALT  23  ALKPHOS  131*  BILITOT  1.5*  ALBUMIN  1.7*    Cardiac Enzymes No results for input(s): TROPONINI, PROBNP in the last 72 hours.  Glucose Recent Labs     09/06/15  1238  09/06/15  1556  09/06/15  2024  09/07/15  0021  09/07/15  0458  09/07/15  0501  GLUCAP  136*  138*  139*  129*  366*  143*    Imaging US Renal  09/06/2015  CLINICAL DATA:  Acute kidney  insufficiency EXAM: RENAL / URINARY TRACT ULTRASOUND COMPLETE COMPARISON:  CT scan 08/30/2015 FINDINGS: Right Kidney: Length: 10.9 cm in length. Echogenicity within normal limits. No mass or hydronephrosis visualized. Left Kidney: Length: 12.4 cm in length. Echogenicity within normal limits. No mass or hydronephrosis visualized. Bladder: The urinary bladder is decompressed with Foley catheter. IMPRESSION: 1. No hydronephrosis. No renal calculi. Bilateral kidney shows normal echogenicity. Decompressed urinary bladder with Foley catheter. Electronically Signed   By: Lahoma Crocker M.D.   On: 09/06/2015 16:38   Dg Chest Port 1 View  09/06/2015  CLINICAL DATA:  Respiratory failure. EXAM: PORTABLE CHEST 1 VIEW COMPARISON:  09/05/2015 FINDINGS: Endotracheal tube, enteric catheter, right IJ approach central venous catheter are stable. Cardiomediastinal silhouette is normal. Mediastinal contours appear intact. There is no evidence of pneumothorax. There are stable bilateral pulmonary masses. The right hemidiaphragm is obscured likely by previously demonstrated right middle lobe mass. Associated right middle and lower  lobe atelectasis or postobstructive airspace consolidation cannot be excluded. Osseous structures are without acute abnormality. Soft tissues are grossly normal. IMPRESSION: Stable appearance of the chest with bilateral pulmonary masses, including large right middle lobe mass with likely right middle and lower lobe post obstructive atelectasis or airspace consolidation. Stable support apparatus. Electronically Signed   By: Fidela Salisbury M.D.   On: 09/06/2015 09:33    CULTURES: 1/13 Blood >> neg 1/14 Pneumococcal Ag >> negative 1/14 Legionella Ag >> negative 1/15 C diff PCR >> negative  ANTIBIOTICS: 1/13 Rocephin >> 1/13 1/13 Zithromax >> 1/13 1/14 Vancomycin >> 1/19, restart 1/21 1/14 Zosyn >> 1/20. 1/21 Ceftaz >>  LINES/TUBES: 1/15 Rt PICC >>   STUDIES: 1/13 CT chest >> multiple b/l  nodules, mass like consolidation Rt mid lung, Rt hilar and subcarinal mass, Lt hilar LAN, 2.7 cm Rt paratracheal LAN 1/14 CT abd/pelvis >> 10 cm mass superior to uterus 1/15 Tumor markers >> CA 19-9 453, CEA 3.9, CA 125 780.8 1/16 Bronchoscopy >> crush artifact ?small cell lung cancer (non diagnostic)  EVENTS: 1/13 Admit 1/15 Gyn oncology consulted 1/16 Bronchoscopy in ICU; Placed on BiPAP for wheezing, dyspnea 1/19 IR biopsy  DISCUSSION: 40 yo female with progressive dyspnea, wheezing.  She was found to have abnormal CT chest/abdomen/pelvis from metastatic cancer.  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   ASSESSMENT/PLAN:  PULMONARY A: Acute hypoxic respiratory failure 2nd to post-obstructive pneumonia, metastatic cancer, and wheeze from extrinsic airway compression. Respiratory Alkalosis  P: - await pathology results prior to decision for extubation.  If path neg from CT bx, will need mediastinoscopy. - continue xopenex, pulmicort  CARDIAC A: Persistent inus tachycardia. P: - Monitor hemodynamics. - Another 500cc IV bolus. Continue NS at 100 cc/hr - Follow lactic acid.  RENAL A: Hypokalemia >> improved. Metabolic Alkalosis - post diuresis / hypercapnic. Now with AKI. No hydro on [place] P: IVF hydration as above. Monitor urine output and Cr  GASTROENTEROLOGY A: Nutrition. P: - Tube feeds - Protonix for SUP  HEMATOLOGY/ONCOLOGY A: Metastatic cancer. Anemia of critical illness. P: - s/p  biopsy with IR 09/04/15 >>if non diagnostic, then would need thoracic surgery to assess for mediastinoscopy - f/u with Gyn oncology  INFECTION A: Sepsis 2nd to post-obstructive pneumonia. Fevers and elevated LA are likely secondary to tumor P: - Follow cultures and procalcitonin. - Restarted on broad spectrum  antibiotics.  ENDOCRINE A: No acute issues. P: - monitor blood glucose on BMET  NEUROLOGY A: Cancer pain. P: - Propofol for sedation  - fentanyl for pain  - CT head today for unresponsiveness.  Husband updated at bedside 1/22.  Critical care time- 35 mins  Marshell Garfinkel MD [facility] Pager [phone] If no answer or after 3pm call: [phone] 09/07/2015, 10:41 AM

## 2015-09-07 NOTE — Progress Notes (Signed)
RT called to patient beside. Patient desating in 85-88%. RT bag lavaged patient. Patient is now 93% on 75% FIO2. RT will continue to monitor.

## 2015-09-08 ENCOUNTER — Inpatient Hospital Stay (HOSPITAL_COMMUNITY): Payer: BC Managed Care – PPO

## 2015-09-08 LAB — CBC
HEMATOCRIT: 29.9 % — AB (ref 36.0–46.0)
Hemoglobin: 9.3 g/dL — ABNORMAL LOW (ref 12.0–15.0)
MCH: 27 pg (ref 26.0–34.0)
MCHC: 31.1 g/dL (ref 30.0–36.0)
MCV: 86.7 fL (ref 78.0–100.0)
PLATELETS: 287 10*3/uL (ref 150–400)
RBC: 3.45 MIL/uL — ABNORMAL LOW (ref 3.87–5.11)
RDW: 15.1 % (ref 11.5–15.5)
WBC: 18.7 10*3/uL — AB (ref 4.0–10.5)

## 2015-09-08 LAB — BASIC METABOLIC PANEL
ANION GAP: 16 — AB (ref 5–15)
BUN: 64 mg/dL — ABNORMAL HIGH (ref 6–20)
CALCIUM: 8.3 mg/dL — AB (ref 8.9–10.3)
CO2: 25 mmol/L (ref 22–32)
CREATININE: 1.36 mg/dL — AB (ref 0.44–1.00)
Chloride: 113 mmol/L — ABNORMAL HIGH (ref 101–111)
GFR, EST AFRICAN AMERICAN: 56 mL/min — AB (ref >60)
GFR, EST NON AFRICAN AMERICAN: 48 mL/min — AB (ref >60)
Glucose, Bld: 128 mg/dL — ABNORMAL HIGH (ref 65–99)
Potassium: 3.5 mmol/L (ref 3.5–5.1)
SODIUM: 154 mmol/L — AB (ref 135–145)

## 2015-09-08 LAB — MAGNESIUM: MAGNESIUM: 2.9 mg/dL — AB (ref 1.7–2.4)

## 2015-09-08 LAB — GLUCOSE, CAPILLARY
GLUCOSE-CAPILLARY: 133 mg/dL — AB (ref 65–99)
GLUCOSE-CAPILLARY: 146 mg/dL — AB (ref 65–99)
Glucose-Capillary: 120 mg/dL — ABNORMAL HIGH (ref 65–99)
Glucose-Capillary: 122 mg/dL — ABNORMAL HIGH (ref 65–99)
Glucose-Capillary: 123 mg/dL — ABNORMAL HIGH (ref 65–99)

## 2015-09-08 LAB — LACTIC ACID, PLASMA: LACTIC ACID, VENOUS: 2.6 mmol/L — AB (ref 0.5–2.0)

## 2015-09-08 LAB — TROPONIN I: Troponin I: 0.11 ng/mL — ABNORMAL HIGH (ref <0.031)

## 2015-09-08 LAB — HEPATIC FUNCTION PANEL
ALT: 24 U/L (ref 14–54)
AST: 140 U/L — ABNORMAL HIGH (ref 15–41)
Albumin: 1.8 g/dL — ABNORMAL LOW (ref 3.5–5.0)
Alkaline Phosphatase: 128 U/L — ABNORMAL HIGH (ref 38–126)
BILIRUBIN DIRECT: 0.9 mg/dL — AB (ref 0.1–0.5)
BILIRUBIN INDIRECT: 0.9 mg/dL (ref 0.3–0.9)
TOTAL PROTEIN: 6.1 g/dL — AB (ref 6.5–8.1)
Total Bilirubin: 1.8 mg/dL — ABNORMAL HIGH (ref 0.3–1.2)

## 2015-09-08 LAB — PROCALCITONIN: PROCALCITONIN: 69.11 ng/mL

## 2015-09-08 LAB — PHOSPHORUS: PHOSPHORUS: 3.4 mg/dL (ref 2.5–4.6)

## 2015-09-08 MED ORDER — NEPRO/CARBSTEADY PO LIQD
1000.0000 mL | ORAL | Status: DC
  Administered 2015-09-08: 1000 mL via ORAL
  Filled 2015-09-08 (×3): qty 1000

## 2015-09-08 MED ORDER — FREE WATER
200.0000 mL | Status: DC
  Administered 2015-09-08 – 2015-09-09 (×5): 200 mL

## 2015-09-08 MED ORDER — FENTANYL CITRATE (PF) 100 MCG/2ML IJ SOLN
25.0000 ug | INTRAMUSCULAR | Status: DC | PRN
  Administered 2015-09-08 (×6): 50 ug via INTRAVENOUS
  Filled 2015-09-08 (×6): qty 2

## 2015-09-08 MED ORDER — LACTATED RINGERS IV SOLN
INTRAVENOUS | Status: DC
  Administered 2015-09-08 – 2015-09-09 (×3): via INTRAVENOUS

## 2015-09-08 MED ORDER — SODIUM CHLORIDE 3 % IN NEBU
5.0000 mL | INHALATION_SOLUTION | Freq: Two times a day (BID) | RESPIRATORY_TRACT | Status: AC
  Administered 2015-09-08: 5 mL via RESPIRATORY_TRACT
  Administered 2015-09-08: 4 mL via RESPIRATORY_TRACT
  Administered 2015-09-09 – 2015-09-10 (×4): 5 mL via RESPIRATORY_TRACT
  Filled 2015-09-08 (×6): qty 8

## 2015-09-08 MED ORDER — FREE WATER
200.0000 mL | Freq: Four times a day (QID) | Status: DC
  Administered 2015-09-08: 200 mL

## 2015-09-08 NOTE — Progress Notes (Signed)
eLink Physician-Brief Progress Note Patient Name: TANISHI NAULT DOB: 1975-08-20 MRN: [id number]   Date of Service  09/08/2015  HPI/Events of Note  Patient's husband concerned about patient having discomfort.   eICU Interventions  Will order Fentanyl 25-50 mcg IV Q 2 hours PRN pain.     Intervention Category Intermediate Interventions: Pain - evaluation and management  Sommer,Steven Eugene 09/08/2015, 12:24 AM

## 2015-09-08 NOTE — Progress Notes (Signed)
63m of fentanyl wasted in sink. Witnessed by DWandra Scot RN.

## 2015-09-08 NOTE — Progress Notes (Signed)
   09/08/15 1500  Clinical Encounter Type  Visited With Family  Visit Type Follow-up  Consult/Referral To Nurse  Spiritual Encounters  Spiritual Needs Emotional;Grief support;Prayer  Stress Factors  Family Stress Factors Exhausted;Major life changes;Loss of control   Continued support with spouse, Monique [name], at bedside.  He is anxious to hear results of biopsy.  Son, Monique [name], has been present in hospital with [name]'s mother in law, 39 Father and brother enroute from [place].  Will be present tonight.      [place], [place]

## 2015-09-08 NOTE — Progress Notes (Signed)
PCCM PROGRESS NOTE  ADMISSION DATE: 08/27/2015 CONSULT DATE: 08/30/2015 REFERRING PROVIDER: Dr. Roel Cluck, [facility]  CC: Short of breath  SUBJECTIVE:  Continues to be febrile with sinus tachycardia. Hypoxia requiring higher FiO2.   VITAL SIGNS: BP 101/69 mmHg  Pulse 134  Temp(Src) 101.6 F (38.7 C) (Rectal)  Resp 23  Ht 5' 5.5" (1.664 m)  Wt 181 lb 7 oz (82.3 kg)  BMI 29.72 kg/m2  SpO2 93%  LMP 08/23/2015  INTAKE/OUTPUT: I/O last 3 completed shifts: In: 6409.5 [I.V.:3839.5; NG/GT:2170; IV Piggyback:400] Out: 3075 [Urine:3075]  General: Sedated, on vent.  Has marked weakness. RUE seems weaker  HEENT: ETT, No JVD Cardiac: regular, No MRG Chest: B/L rhonchi, decreased on right  Abd: Distended. + BS Ext: 1+ edema Neuro: right side weaker, Does nod to questions when asked about pain.  Skin: no rashes  CBC Recent Labs     09/06/15  0420  09/07/15  1040  09/08/15  0400  WBC  21.1*  20.0*  18.7*  HGB  10.2*  9.3*  9.3*  HCT  32.8*  31.0*  29.9*  PLT  347  308  287    Coag's No results for input(s): APTT, INR in the last 72 hours.  BMET Recent Labs     09/06/15  0420  09/07/15  1040  09/08/15  0400  NA  141  148*  154*  K  4.0  3.9  3.5  CL  99*  111  113*  CO2  '29  27  25  '$ BUN  58*  66*  64*  CREATININE  1.70*  1.44*  1.36*  GLUCOSE  144*  150*  128*    Electrolytes Recent Labs     09/06/15  0420  09/07/15  1040  09/08/15  0400  CALCIUM  9.1  8.8*  8.3*  MG   --    --   2.9*  PHOS   --    --   3.4    Sepsis Markers Recent Labs     09/06/15  1205  09/07/15  0430  09/08/15  0400  PROCALCITON  91.04  81.96  69.11    ABG Recent Labs     09/05/15  1530  09/07/15  1530  PHART  7.474*  7.309*  PCO2ART  38.5  55.0*  PO2ART  60.9*  51.0*    Liver Enzymes Recent Labs     09/07/15  0430  09/08/15  0400  AST  128*  140*  ALT  23  24  ALKPHOS  131*  128*  BILITOT  1.5*  1.8*  ALBUMIN  1.7*  1.8*    Cardiac Enzymes Recent Labs      09/07/15  0100  09/07/15  1123  09/07/15  1750  TROPONINI  0.11*  0.09*  0.09*    Glucose Recent Labs     09/07/15  0759  09/07/15  1214  09/07/15  1604  09/07/15  2012  09/08/15  0149  09/08/15  0847  GLUCAP  137*  125*  129*  143*  120*  122*    Imaging Ct Head Wo Contrast  09/07/2015  CLINICAL DATA:  40 year old female, unresponsive. Recently diagnosed with metastatic ovarian cancer, not yet begun treatment. Subsequent encounter. EXAM: CT HEAD WITHOUT CONTRAST TECHNIQUE: Contiguous axial images were obtained from the base of the skull through the vertex without intravenous contrast. COMPARISON:  Face CT 04/25/2006. FINDINGS: Intubated on the scout view. Visualized paranasal sinuses and mastoids are clear.  Negative visualized nasopharynx. No acute orbit or scalp soft tissue findings. No acute osseous abnormality identified. No midline shift, ventriculomegaly, mass effect, evidence of mass lesion, intracranial hemorrhage or evidence of cortically based acute infarction. Gray-white matter differentiation is within normal limits throughout the brain. No suspicious intracranial vascular hyperdensity. IMPRESSION: Normal noncontrast CT appearance of the brain. Metastatic disease to the brain from ovarian cancer would be highly unusual, but early metastatic disease cannot be excluded in the absence of intravenous contrast. Electronically Signed   By: Genevie Ann M.D.   On: 09/07/2015 13:36   US Renal  09/06/2015  CLINICAL DATA:  Acute kidney insufficiency EXAM: RENAL / URINARY TRACT ULTRASOUND COMPLETE COMPARISON:  CT scan 08/30/2015 FINDINGS: Right Kidney: Length: 10.9 cm in length. Echogenicity within normal limits. No mass or hydronephrosis visualized. Left Kidney: Length: 12.4 cm in length. Echogenicity within normal limits. No mass or hydronephrosis visualized. Bladder: The urinary bladder is decompressed with Foley catheter. IMPRESSION: 1. No hydronephrosis. No renal calculi. Bilateral kidney  shows normal echogenicity. Decompressed urinary bladder with Foley catheter. Electronically Signed   By: Lahoma Crocker M.D.   On: 09/06/2015 16:38   Dg Chest Port 1 View  09/08/2015  CLINICAL DATA:  Hypoxia EXAM: PORTABLE CHEST 1 VIEW COMPARISON:  Chest radiograph September 07, 2015 and chest CT August 29, 2015 FINDINGS: Endotracheal tube tip is 3.5 cm above the carina. Nasogastric tube tip and side port are below the diaphragm. Central catheter tip is in the superior vena cava. No pneumothorax. There are multiple pulmonary mass lesions consistent with metastatic disease. There is airspace consolidation throughout most of the right mid and lower lung zone regions. No new opacity is evident. Heart size and pulmonary vascularity are normal. There is extensive adenopathy, better demonstrated by CT. No demonstrable bone lesions. IMPRESSION: Tube and catheter positions as described without pneumothorax. Extensive airspace consolidation on the right. Pulmonary metastases present. No change in cardiac silhouette. Electronically Signed   By: Lowella Grip III M.D.   On: 09/08/2015 07:13   Dg Chest Port 1 View  09/07/2015  CLINICAL DATA:  Pneumonia. EXAM: PORTABLE CHEST 1 VIEW COMPARISON:  09/05/2005 FINDINGS: The support apparatus is stable.  No complicating features. Diffuse pulmonary metastatic disease is again demonstrated. Persistent dense airspace consolidation in the right lower lobe, likely pneumonia. Slight interval improved aeration. IMPRESSION: Stable support apparatus. Diffuse pulmonary metastatic disease. Dense right lower lobe airspace consolidation show slight improvement. Electronically Signed   By: Marijo Sanes M.D.   On: 09/07/2015 15:27    CULTURES: 1/13 Blood >> neg 1/14 Pneumococcal Ag >> negative 1/14 Legionella Ag >> negative 1/15 C diff PCR >> negative 1/22 BAL>>> BCX2 1/21>>>  ANTIBIOTICS: 1/13 Rocephin >> 1/13 1/13 Zithromax >> 1/13 1/14 Vancomycin >> 1/19, restart 1/21>>> 1/14  Zosyn >> 1/20. 1/21 Ceftaz >>  LINES/TUBES: 1/15 Rt PICC >>   STUDIES: 1/13 CT chest >> multiple b/l nodules, mass like consolidation Rt mid lung, Rt hilar and subcarinal mass, Lt hilar LAN, 2.7 cm Rt paratracheal LAN 1/14 CT abd/pelvis >> 10 cm mass superior to uterus 1/15 Tumor markers >> CA 19-9 453, CEA 3.9, CA 125 780.8 1/16 Bronchoscopy >> crush artifact ?small cell lung cancer (non diagnostic)  EVENTS: 1/13 Admit 1/15 Gyn oncology consulted 1/16 Bronchoscopy in ICU; Placed on BiPAP for wheezing, dyspnea 1/19 IR biopsy>>> 1/22 bronch cytology re-sent >>  DISCUSSION: 40 yo female with progressive dyspnea, wheezing.  She was found to have abnormal CT chest/abdomen/pelvis from metastatic cancer.  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 Primary concern today is her encephalopathy. We will focus on correcting her water imbalance, decreasing sedating meds, and continue supportive care. IF she remains encephalopathic then we will need to consider EEg and further imaging of brain on 1/24. From a pulm stand-point she remains on high FIO2/peep which is the result of intrinsic airway compression, post obstructive PNA/ATX and mucous plugging. For this we will cont nebs and add nebulized hypertonic saline. Will call heme/onc as soon as we get some path info, but still awaiting this.   ASSESSMENT/PLAN:  PULMONARY A: Acute hypoxic respiratory failure 2nd to post-obstructive pneumonia, metastatic cancer, and wheeze from extrinsic airway compression.  P: - await pathology results prior to decision for extubation.  If path neg from CT bx may mediastinoscopy. - continue xopenex, pulmicort, add hypertonic saline nebs - cont full vent support  - f/u abg and cxr  CARDIAC A: Persistent   sinus tachycardia. P: - Monitor hemodynamics.  RENAL A: AKI Hypernatremia  Hyperchloremia  P: Change IVFs to LR Free water replacement F/u chemistry in am   GASTROENTEROLOGY A: Nutrition. Increased triglycerides  P: - Tube feeds - Protonix for SUP - dc diprivan   HEMATOLOGY/ONCOLOGY A: Metastatic cancer. Anemia of critical illness. P: - s/p  biopsy with IR 09/04/15 >>if non diagnostic, then would need thoracic surgery to assess for mediastinoscopy - f/u with Gyn oncology  INFECTION A: Sepsis 2nd to post-obstructive pneumonia. Fevers and elevated LA are likely secondary to tumor P: - Follow cultures and procalcitonin. - Restarted on broad spectrum antibiotics 1/21  ENDOCRINE A: No acute issues. P: - monitor blood glucose on BMET  NEUROLOGY A: Cancer pain. P: - Propofol for sedation  - fentanyl for pain    Husband updated at bedside 1/23  09/08/2015, 11:03 AM

## 2015-09-08 NOTE — Progress Notes (Signed)
Nutrition Follow-up  DOCUMENTATION CODES:   Not applicable  INTERVENTION:  - Will change TF regimen with no Propofol at this time - Will order: Nepro @ 55 mL/hr which will provide 2376 kcal (98% estimated needs), 107 grams of protein, and 960 mL free water.  - Will order 200 mL free water every 6 hours to provide 800 mL free water due to hypernatremia - RD will continue to monitor for needs  NUTRITION DIAGNOSIS:   Inadequate oral intake related to inability to eat as evidenced by NPO status. -ongoing  GOAL:   Patient will meet greater than or equal to 90% of their needs -unmet with current TF regimen  MONITOR:   TF tolerance, Skin, Vent status, Labs, I & O's  ASSESSMENT:   Presented with about the month worsens worsening shortness of breath and wheezing. She has been seen for this by her primary care provider diagnosed with pneumonia based on chest x-ray and completed antibiotics is not improvement repeat chest x-ray showed persistent pneumonia she had a total three courses of antibiotics Including z-pack, levaquin and clarithromycin. Suspect that she has a malignancy of sort, however, it is unclear what the primary is;  Multi-compartmental masses (chest, adenopathy, ovarian).   1/23 Patient is currently intubated on ventilator support MV: 12 L/min Temp (24hrs), Avg:103.4 F (39.7 C), Min:102 F (38.9 C), Max:105.6 F (40.9 C) Propofol: none  Family member at bedside. Pt is currently receiving Vital High Protein @ 60 mL/hr and Propofol has been d/c'ed since previous assessment. This regimen is providing 1440 kcal (59% estimated needs), 126 grams protein (100% estimated needs), and 1204 mL free water.  Will change TF as outlined above. Nutrition needs re-estimated based on current vent settings and Tmax. Medications reviewed. Labs reviewed; CBGs: 120-143 mg/dL, Na: 154 mmol/L, Cl: 113 mmol/L, BUN/creatinine elevated and trending up, Ca: 8.3 mg/dL, GFR: 48, Mg: 2.9  mg/dL.   1/20 - Prelim results from IR needle bx 09/04/15 >> concerning for neuroendocrine/small cell >> special stains pending to confirm. - 1/19 pt intubated - Pt sedated, no family in room.  - Patient is currently intubated on ventilator support - MV: 16.7 L/min - Propofol: 24.2 ml/hr provides 638 kcal per day from lipid  1/19 - Pt is NPO pending biopsy per IR today.  - Pt was switched from BiPAP to HFNC earlier this AM and was SOB during visit so family member, at bedside, provided the majority of information. - Pt had a good appetite with no recent changes in intakes, abilities, or taste sensation PTA.  - Since admission her appetite has decreased "to half of what it used to be."  - Family again confirms that this change started after admission.  - He states that pt has not been consuming food provided by the hospital, but, rather, family has been providing pt with outside foods that she really enjoys.  - Family member states that pt did very well with intakes yesterday to the point of being at her baseline. - Pt and family deny recent weight changes.  - No prior weight hx available in the chart. No muscle or fat wasting noted during physical assessment. - Likely variably meeting needs since admission.  Diet Order:  Diet NPO time specified  Skin:  Reviewed, no issues  Last BM:  1/22  Height:   Ht Readings from Last 1 Encounters:  08/30/15 5' 5.5" (1.664 m)    Weight:   Wt Readings from Last 1 Encounters:  09/07/15 181 lb 7  oz (82.3 kg)    Ideal Body Weight:  57.95 kg (kg)  BMI:  Body mass index is 29.72 kg/(m^2).  Estimated Nutritional Needs:   Kcal:  2434  Protein:  99-123 grams  Fluid:  >/= 2 L/day  EDUCATION NEEDS:   No education needs identified at this time     Jarome Matin, RD, LDN Inpatient Clinical Dietitian Pager # [phone] After hours/weekend pager # [phone]

## 2015-09-08 NOTE — Progress Notes (Signed)
Date: September 08, 2015 Chart reviewed for concurrent status and case management needs. Will continue to follow patient for changes and needs: temp 105.1/vent full support/ Velva Harman, BSN, [place], [place],   [phone]

## 2015-09-09 ENCOUNTER — Inpatient Hospital Stay (HOSPITAL_COMMUNITY): Payer: BC Managed Care – PPO

## 2015-09-09 DIAGNOSIS — M79606 Pain in leg, unspecified: Secondary | ICD-10-CM

## 2015-09-09 DIAGNOSIS — Z789 Other specified health status: Secondary | ICD-10-CM | POA: Insufficient documentation

## 2015-09-09 DIAGNOSIS — Z806 Family history of leukemia: Secondary | ICD-10-CM

## 2015-09-09 DIAGNOSIS — R599 Enlarged lymph nodes, unspecified: Secondary | ICD-10-CM

## 2015-09-09 DIAGNOSIS — Z807 Family history of other malignant neoplasms of lymphoid, hematopoietic and related tissues: Secondary | ICD-10-CM

## 2015-09-09 DIAGNOSIS — I8000 Phlebitis and thrombophlebitis of superficial vessels of unspecified lower extremity: Secondary | ICD-10-CM

## 2015-09-09 DIAGNOSIS — Z87891 Personal history of nicotine dependence: Secondary | ICD-10-CM

## 2015-09-09 DIAGNOSIS — R1900 Intra-abdominal and pelvic swelling, mass and lump, unspecified site: Secondary | ICD-10-CM

## 2015-09-09 LAB — CBC
HCT: 28.6 % — ABNORMAL LOW (ref 36.0–46.0)
Hemoglobin: 8.7 g/dL — ABNORMAL LOW (ref 12.0–15.0)
MCH: 26.9 pg (ref 26.0–34.0)
MCHC: 30.4 g/dL (ref 30.0–36.0)
MCV: 88.5 fL (ref 78.0–100.0)
PLATELETS: 287 10*3/uL (ref 150–400)
RBC: 3.23 MIL/uL — ABNORMAL LOW (ref 3.87–5.11)
RDW: 15.6 % — AB (ref 11.5–15.5)
WBC: 24 10*3/uL — ABNORMAL HIGH (ref 4.0–10.5)

## 2015-09-09 LAB — COMPREHENSIVE METABOLIC PANEL
ALT: 24 U/L (ref 14–54)
AST: 122 U/L — ABNORMAL HIGH (ref 15–41)
Albumin: 1.7 g/dL — ABNORMAL LOW (ref 3.5–5.0)
Alkaline Phosphatase: 134 U/L — ABNORMAL HIGH (ref 38–126)
Anion gap: 18 — ABNORMAL HIGH (ref 5–15)
BUN: 85 mg/dL — ABNORMAL HIGH (ref 6–20)
CHLORIDE: 113 mmol/L — AB (ref 101–111)
CO2: 22 mmol/L (ref 22–32)
Calcium: 9 mg/dL (ref 8.9–10.3)
Creatinine, Ser: 2.26 mg/dL — ABNORMAL HIGH (ref 0.44–1.00)
GFR, EST AFRICAN AMERICAN: 30 mL/min — AB (ref >60)
GFR, EST NON AFRICAN AMERICAN: 26 mL/min — AB (ref >60)
Glucose, Bld: 142 mg/dL — ABNORMAL HIGH (ref 65–99)
POTASSIUM: 3.9 mmol/L (ref 3.5–5.1)
Sodium: 153 mmol/L — ABNORMAL HIGH (ref 135–145)
Total Bilirubin: 1.4 mg/dL — ABNORMAL HIGH (ref 0.3–1.2)
Total Protein: 5.8 g/dL — ABNORMAL LOW (ref 6.5–8.1)

## 2015-09-09 LAB — GLUCOSE, CAPILLARY
GLUCOSE-CAPILLARY: 117 mg/dL — AB (ref 65–99)
GLUCOSE-CAPILLARY: 136 mg/dL — AB (ref 65–99)
GLUCOSE-CAPILLARY: 137 mg/dL — AB (ref 65–99)
GLUCOSE-CAPILLARY: 144 mg/dL — AB (ref 65–99)
Glucose-Capillary: 133 mg/dL — ABNORMAL HIGH (ref 65–99)
Glucose-Capillary: 152 mg/dL — ABNORMAL HIGH (ref 65–99)

## 2015-09-09 LAB — CULTURE, RESPIRATORY W GRAM STAIN

## 2015-09-09 LAB — CULTURE, BAL-QUANTITATIVE W GRAM STAIN

## 2015-09-09 LAB — CULTURE, BAL-QUANTITATIVE

## 2015-09-09 LAB — CULTURE, RESPIRATORY

## 2015-09-09 LAB — VANCOMYCIN, TROUGH: Vancomycin Tr: 36 ug/mL (ref 10.0–20.0)

## 2015-09-09 MED ORDER — STERILE WATER FOR INJECTION IV SOLN
INTRAVENOUS | Status: DC
  Administered 2015-09-10 – 2015-09-12 (×6): via INTRAVENOUS
  Filled 2015-09-09 (×6): qty 850

## 2015-09-09 MED ORDER — HEPARIN BOLUS VIA INFUSION
4500.0000 [IU] | Freq: Once | INTRAVENOUS | Status: AC
  Administered 2015-09-09: 4500 [IU] via INTRAVENOUS
  Filled 2015-09-09: qty 4500

## 2015-09-09 MED ORDER — FREE WATER
350.0000 mL | Status: DC
  Administered 2015-09-09 – 2015-09-11 (×12): 350 mL

## 2015-09-09 MED ORDER — CEFTAZIDIME 1 G IJ SOLR
1.0000 g | Freq: Two times a day (BID) | INTRAMUSCULAR | Status: DC
Start: 2015-09-09 — End: 2015-09-12
  Administered 2015-09-09 – 2015-09-12 (×6): 1 g via INTRAVENOUS
  Filled 2015-09-09 (×7): qty 1

## 2015-09-09 MED ORDER — SODIUM BICARBONATE 8.4 % IV SOLN
100.0000 meq | Freq: Once | INTRAVENOUS | Status: AC
  Administered 2015-09-10: 100 meq via INTRAVENOUS
  Filled 2015-09-09: qty 50

## 2015-09-09 MED ORDER — HEPARIN (PORCINE) IN NACL 100-0.45 UNIT/ML-% IJ SOLN
1300.0000 [IU]/h | INTRAMUSCULAR | Status: DC
  Administered 2015-09-09 – 2015-09-14 (×6): 1300 [IU]/h via INTRAVENOUS
  Filled 2015-09-09 (×13): qty 250

## 2015-09-09 MED ORDER — SODIUM CHLORIDE 0.9 % IV BOLUS (SEPSIS)
500.0000 mL | Freq: Once | INTRAVENOUS | Status: AC
  Administered 2015-09-09: 500 mL via INTRAVENOUS

## 2015-09-09 MED ORDER — DEXTROSE 5 % IV SOLN
INTRAVENOUS | Status: DC
  Administered 2015-09-09 – 2015-09-10 (×3): via INTRAVENOUS
  Administered 2015-09-11: 1000 mL via INTRAVENOUS

## 2015-09-09 MED ORDER — NEPRO/CARBSTEADY PO LIQD
1000.0000 mL | ORAL | Status: DC
  Administered 2015-09-09 – 2015-09-15 (×7): 1000 mL via ORAL
  Filled 2015-09-09 (×10): qty 1000

## 2015-09-09 NOTE — Progress Notes (Signed)
Spoke w/ Pathology re: results from LN bx  Findings: Poorly differentiated high Grade Carcinoma.  >stains favor Neuroendocrine TTF stain was NEGATIVE -->with these findings Pathology was not able to give definitive source   Spoke w/ Dr Inda Merlin who recommended general heme/onc consult given non-specific findings.   Erick Colace ACNP-BC [place] Pager # [phone] OR # [phone] if no answer

## 2015-09-09 NOTE — Procedures (Signed)
Central Venous Catheter Insertion Procedure Note DEBORRA PHEGLEY [id number] 06-09-1976  Procedure: Insertion of Central Venous Catheter Indications: Assessment of intravascular volume, Drug and/or fluid administration and Frequent blood sampling  Procedure Details Consent: Risks of procedure as well as the alternatives and risks of each were explained to the (patient/caregiver).  Consent for procedure obtained. Time Out: Verified patient identification, verified procedure, site/side was marked, verified correct patient position, special equipment/implants available, medications/allergies/relevent history reviewed, required imaging and test results available.  Performed Real time [place] was used to ID and cannulate the vessel  Maximum sterile technique was used including antiseptics, cap, gloves, gown, hand hygiene, mask and sheet. Skin prep: Chlorhexidine; local anesthetic administered A antimicrobial bonded/coated triple lumen catheter was placed in the left internal jugular vein using the Seldinger technique.  Evaluation Blood flow good Complications: No apparent complications Patient did tolerate procedure well. Chest X-ray ordered to verify placement.  CXR: pending.  Clementeen Graham 09/09/2015, 1:55 PM

## 2015-09-09 NOTE — Progress Notes (Signed)
  Pharmacy Antibiotic Follow-up Note  Monique [name] is a 40 y.o. year-old female admitted on 09/05/2015.  The patient is currently on day 4 of vancomycin and ceftazidime.  Assessment/Plan: The dose of ceftazidime will be adjusted to 1 gr IV q12h based on renal function.  With large increase in SCr, will obtain VT prior to dose that would have been due at 1600 on 1/24. Will place vancomycin on hold until level returns to determine dose.   Temp (24hrs), Avg:100.4 F (38 C), Min:99 F (37.2 C), Max:102.3 F (39.1 C)   Recent Labs Lab 09/05/15 0415 09/06/15 0420 09/07/15 1040 09/08/15 0400 09/09/15 0530  WBC 16.4* 21.1* 20.0* 18.7* 24.0*    Recent Labs Lab 09/05/15 0415 09/06/15 0420 09/07/15 1040 09/08/15 0400 09/09/15 0530  CREATININE 0.95 1.70* 1.44* 1.36* 2.26*   Estimated Creatinine Clearance: 36.3 mL/min (by C-G formula based on Cr of 2.26).    No Known Allergies  Antimicrobials this admission: 1/13 >> Rocephin >> 1/13 1/13 >> Zithromax >> 1/13 1/14 >> Vanc >> 1/19, 1/21 >> Vanc 1/14 >> Zosyn >> 1/20 1/21 >> Fortaz >>  Levels/dose changes this admission: 1/15 1900 VT: 15 on 1g IV q8h 1/18 1130 VT: 15 on 1g IV q8h 1/24 1500 VT: __________. Decrease ceftazidime to 1 gr IV q12h   Microbiology results: 1/14 HIV screen: neg 1/14 S. pneumo UAg: neg 1/14 Legionella UAg: neg 1/14 MRSA PCR: neg 1/15 Cdiff PCR: negative 1/13 blood x2: NGF 1/21 Trach asp: moderate yeast c/w candida, final 1/21 blood x2: ngtd 1/22 bronch washings: 5K colonies/ml, Candida, final  Thank you for allowing pharmacy to be a part of this patient's care.  Royetta Asal, PharmD, BCPS Pager [phone] 09/09/2015 10:29 AM

## 2015-09-09 NOTE — Progress Notes (Signed)
  Pharmacy Antibiotic Follow-up Note  Monique [name] is a 40 y.o. year-old female admitted on 09/03/2015.  The patient is currently on day 4 of vancomycin and ceftazidime.  Assessment/Plan:  Ceftazidime 1gm IV q12h  Hold Vancomycin due to elevated trough level (36 mcg/ml).  Will recheck random vancomycin level with AM labs and will resume dosing once level < 20 mcg/ml  Temp (24hrs), Avg:99.8 F (37.7 C), Min:99 F (37.2 C), Max:100.6 F (38.1 C)   Recent Labs Lab 09/05/15 0415 09/06/15 0420 09/07/15 1040 09/08/15 0400 09/09/15 0530  WBC 16.4* 21.1* 20.0* 18.7* 24.0*     Recent Labs Lab 09/05/15 0415 09/06/15 0420 09/07/15 1040 09/08/15 0400 09/09/15 0530  CREATININE 0.95 1.70* 1.44* 1.36* 2.26*   Estimated Creatinine Clearance: 36.3 mL/min (by C-G formula based on Cr of 2.26).    No Known Allergies  Antimicrobials this admission: 1/13 >> Rocephin >> 1/13 1/13 >> Zithromax >> 1/13 1/14 >> Vanc >> 1/19, 1/21 >> Vanc 1/14 >> Zosyn >> 1/20 1/21 >> Tressie Ellis >>  Levels/dose changes this admission: 1/15 1900 VT: 15 on 1g IV q8h 1/18 1130 VT: 15 on 1g IV q8h 1/24 1500 VT: 36 on '750mg'$  IV q8h; hold Vanc. Decrease ceftazidime to 1 gr IV q12h  1/25 0500 Vrandom: _________________________  Microbiology results: 1/14 HIV screen: neg 1/14 S. pneumo UAg: neg 1/14 Legionella UAg: neg 1/14 MRSA PCR: neg 1/15 Cdiff PCR: negative 1/13 blood x2: NGF 1/21 Trach asp: moderate yeast c/w candida, final 1/21 blood x2: ngtd 1/22 bronch washings: 5K colonies/ml, Candida, final  Thank you for allowing pharmacy to be a part of this patient's care.  Leone Haven, PharmD  09/09/2015 5:53 PM

## 2015-09-09 NOTE — Progress Notes (Signed)
PCCM PROGRESS NOTE  ADMISSION DATE: 09/11/2015 CONSULT DATE: 08/30/2015 REFERRING PROVIDER: Dr. Roel Cluck, [facility]  CC: Short of breath  SUBJECTIVE:  Continues to be febrile with sinus tachycardia. Hypoxia requiring higher FiO2.   VITAL SIGNS: BP 125/68 mmHg  Pulse 133  Temp(Src) 100.6 F (38.1 C) (Axillary)  Resp 20  Ht 5' 5.5" (1.664 m)  Wt 190 lb 0.6 oz (86.2 kg)  BMI 31.13 kg/m2  SpO2 92%  LMP 08/19/2015  INTAKE/OUTPUT: I/O last 3 completed shifts: In: 6593.3 [I.V.:3563.9; NG/GT:2629.3; IV Piggyback:400] Out: 3419 [QQIWL:7989; Emesis/NG output:250]  General: Sedated, on vent.  Has marked weakness. RUE seems weaker  HEENT: ETT, No JVD Cardiac: regular, No MRG Chest: B/L rhonchi, decreased on right  Abd: Distended. + BS Ext: 1+ edema Neuro: right side weaker, Does nod to questions when asked about pain.  Skin: no rashes  CBC Recent Labs     09/07/15  1040  09/08/15  0400  09/09/15  0530  WBC  20.0*  18.7*  24.0*  HGB  9.3*  9.3*  8.7*  HCT  31.0*  29.9*  28.6*  PLT  308  287  287    Coag's No results for input(s): APTT, INR in the last 72 hours.  BMET Recent Labs     09/07/15  1040  09/08/15  0400  09/09/15  0530  NA  148*  154*  153*  K  3.9  3.5  3.9  CL  111  113*  113*  CO2  '27  25  22  '$ BUN  66*  64*  85*  CREATININE  1.44*  1.36*  2.26*  GLUCOSE  150*  128*  142*    Electrolytes Recent Labs     09/07/15  1040  09/08/15  0400  09/09/15  0530  CALCIUM  8.8*  8.3*  9.0  MG   --   2.9*   --   PHOS   --   3.4   --     Sepsis Markers Recent Labs     09/06/15  1205  09/07/15  0430  09/08/15  0400  PROCALCITON  91.04  81.96  69.11    ABG Recent Labs     09/07/15  1530  PHART  7.309*  PCO2ART  55.0*  PO2ART  51.0*    Liver Enzymes Recent Labs     09/07/15  0430  09/08/15  0400  09/09/15  0530  AST  128*  140*  122*  ALT  '23  24  24  '$ ALKPHOS  131*  128*  134*  BILITOT  1.5*  1.8*  1.4*  ALBUMIN  1.7*  1.8*  1.7*     Cardiac Enzymes Recent Labs     09/07/15  0100  09/07/15  1123  09/07/15  1750  TROPONINI  0.11*  0.09*  0.09*    Glucose Recent Labs     09/08/15  1246  09/08/15  1537  09/08/15  1945  09/09/15  0011  09/09/15  0350  09/09/15  0825  GLUCAP  133*  146*  123*  137*  133*  136*    Imaging Ct Head Wo Contrast  09/07/2015  CLINICAL DATA:  40 year old female, unresponsive. Recently diagnosed with metastatic ovarian cancer, not yet begun treatment. Subsequent encounter. EXAM: CT HEAD WITHOUT CONTRAST TECHNIQUE: Contiguous axial images were obtained from the base of the skull through the vertex without intravenous contrast. COMPARISON:  Face CT 04/25/2006. FINDINGS: Intubated on the scout view. Visualized  paranasal sinuses and mastoids are clear. Negative visualized nasopharynx. No acute orbit or scalp soft tissue findings. No acute osseous abnormality identified. No midline shift, ventriculomegaly, mass effect, evidence of mass lesion, intracranial hemorrhage or evidence of cortically based acute infarction. Gray-white matter differentiation is within normal limits throughout the brain. No suspicious intracranial vascular hyperdensity. IMPRESSION: Normal noncontrast CT appearance of the brain. Metastatic disease to the brain from ovarian cancer would be highly unusual, but early metastatic disease cannot be excluded in the absence of intravenous contrast. Electronically Signed   By: Genevie Ann M.D.   On: 09/07/2015 13:36   Dg Chest Port 1 View  09/08/2015  CLINICAL DATA:  Hypoxia EXAM: PORTABLE CHEST 1 VIEW COMPARISON:  Chest radiograph September 07, 2015 and chest CT August 29, 2015 FINDINGS: Endotracheal tube tip is 3.5 cm above the carina. Nasogastric tube tip and side port are below the diaphragm. Central catheter tip is in the superior vena cava. No pneumothorax. There are multiple pulmonary mass lesions consistent with metastatic disease. There is airspace consolidation throughout most of  the right mid and lower lung zone regions. No new opacity is evident. Heart size and pulmonary vascularity are normal. There is extensive adenopathy, better demonstrated by CT. No demonstrable bone lesions. IMPRESSION: Tube and catheter positions as described without pneumothorax. Extensive airspace consolidation on the right. Pulmonary metastases present. No change in cardiac silhouette. Electronically Signed   By: Lowella Grip III M.D.   On: 09/08/2015 07:13   Dg Chest Port 1 View  09/07/2015  CLINICAL DATA:  Pneumonia. EXAM: PORTABLE CHEST 1 VIEW COMPARISON:  09/05/2005 FINDINGS: The support apparatus is stable.  No complicating features. Diffuse pulmonary metastatic disease is again demonstrated. Persistent dense airspace consolidation in the right lower lobe, likely pneumonia. Slight interval improved aeration. IMPRESSION: Stable support apparatus. Diffuse pulmonary metastatic disease. Dense right lower lobe airspace consolidation show slight improvement. Electronically Signed   By: Marijo Sanes M.D.   On: 09/07/2015 15:27    CULTURES: 1/13 Blood >> neg 1/14 Pneumococcal Ag >> negative 1/14 Legionella Ag >> negative 1/15 C diff PCR >> negative 1/22 BAL>>> BCX2 1/21>>>  ANTIBIOTICS: 1/13 Rocephin >> 1/13 1/13 Zithromax >> 1/13 1/14 Vancomycin >> 1/19, restart 1/21>>> 1/14 Zosyn >> 1/20. 1/21 Ceftaz >>  LINES/TUBES: 1/15 Rt PICC >>   STUDIES: 1/13 CT chest >> multiple b/l nodules, mass like consolidation Rt mid lung, Rt hilar and subcarinal mass, Lt hilar LAN, 2.7 cm Rt paratracheal LAN 1/14 CT abd/pelvis >> 10 cm mass superior to uterus 1/15 Tumor markers >> CA 19-9 453, CEA 3.9, CA 125 780.8 1/16 Bronchoscopy >> crush artifact ?small cell lung cancer (non diagnostic)  EVENTS: 1/13 Admit 1/15 Gyn oncology consulted 1/16 Bronchoscopy in ICU; Placed on BiPAP for wheezing, dyspnea 1/19 IR biopsy>>> 1/22 bronch cytology re-sent >>  DISCUSSION: 40 yo female with progressive  dyspnea, wheezing.  She was found to have abnormal CT chest/abdomen/pelvis from metastatic cancer.  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 Primary concern today is her encephalopathy. We will focus on correcting her water imbalance, decreasing sedating meds, and continue supportive care. IF she remains encephalopathic then we will need to consider EEg and further imaging of brain on 1/24. From a pulm stand-point she remains on high FIO2/peep which is the result of intrinsic  airway compression, post obstructive PNA/ATX and mucous plugging. For this we will cont nebs and add nebulized hypertonic saline. Will call heme/onc as soon as we get some path info, but still awaiting this.   ASSESSMENT/PLAN:  PULMONARY A: Acute hypoxic respiratory failure 2nd to post-obstructive pneumonia, metastatic cancer, and wheeze from extrinsic airway compression.  P: - await pathology results prior to decision for extubation.  If path neg from CT bx may mediastinoscopy. - continue xopenex, pulmicort, add hypertonic saline nebs - cont full vent support  - f/u abg and cxr  CARDIAC A: Persistent  sinus tachycardia. P: - Monitor hemodynamics.  RENAL A: AKI. Cr slightly higher today. Hypernatremia  Hyperchloremia  P: Change IVFs to LR Free water replacement Add D5 W for persistent hypernatremia  GASTROENTEROLOGY A: Nutrition. Increased triglycerides  P: - Tube feeds - Protonix for SUP - dc diprivan   HEMATOLOGY/ONCOLOGY A: Metastatic cancer. Anemia of critical illness. P: - s/p  biopsy with IR 09/04/15 >>if non diagnostic, then would need thoracic surgery to assess for mediastinoscopy - f/u with Gyn oncology  INFECTION A: Sepsis 2nd to post-obstructive pneumonia. Fevers and elevated  LA are likely secondary to tumor P: - Follow cultures and procalcitonin. - Restarted on broad spectrum antibiotics 1/21  ENDOCRINE A: No acute issues. P: - monitor blood glucose on BMET  NEUROLOGY A: Cancer pain. P: - Propofol for sedation  - fentanyl for pain  Husband updated at bedside 1/24  Critical care time 35 mins.  Marshell Garfinkel MD [facility] Pager [phone] If no answer or after 3pm call: [phone] 09/09/2015, 9:32 AM

## 2015-09-09 NOTE — Progress Notes (Signed)
eLink Physician-Brief Progress Note Patient Name: Monique [name] DOB: January 20, 1976 MRN: [id number]   Date of Service  09/09/2015  HPI/Events of Note  Husband wanted to make patient full code.  All questions answered.  eICU Interventions  Change code status to full.     Intervention Category Major Interventions: Other:  Chelci Winter[place] 09/09/2015, 9:02 PM

## 2015-09-09 NOTE — Consult Note (Addendum)
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 She was treated with antibiotics without improvement. She presents emergency room 08/30/2015. A CT of the chest revealed multiple bilateral lung nodules and nodular areas of airspace opacification. Masslike consolidation was noted at the right midlung continuous with soft tissue density at the right hilum and subcarinal region. Extensive mediastinal and bilateral hilar adenopathy. Subcarinal adenopathy. Adenopathy extends into the superior mediastinum and neck base. Airway obstruction is noted at the central right middle lobe. Adenopathy was noted adjacent to the GE junction.   She was admitted. A CT of the abdomen and pelvis 08/30/2015 revealed a large mass superior to the uterus and positioned between the ovaries. Retroperitoneal and gastrohepatic ligament adenopathy.   Critical care medicine was consulted. She was taken to a bronchoscopy 09/01/2015. The right-sided airways were narrowed. An endobronchial lesion was biopsied from the right upper lobe bronchus and endobronchial lesion occluding the right middle lobe bronchus. Brushings and biopsies were obtained. Narrow erythematous airways were noted on left side without endobronchial lesions. A needle biopsy was obtained from a level VII node. The pathology from the level VII node revealed malignant cells. Limited cells did not have definitive staining for CD 56, synaptophysin,, chromogranin, or TTF-1. The morphology favored a small cell carcinoma. A definitive diagnosis could not be made. The surgical pathology from the right upper lobe and right middle lobe biopsies revealed scant crushed malignant cells. Immunohistochemistries could not be performed. The crush artifact and morphology raised a  question of small cell carcinoma. A definitive diagnosis could not be made.  GYN oncology was consulted and felt the presentation was most likely not related to a primary ovarian malignancy.  She was intubated 09/04/2015. She went a CT-guided biopsy of a left peritoneal lymph node on 09/04/2015.  The surgical pathology ([id number]) confirmed poorly differential carcinoma. Tumor cells were noted to have nuclear moaning, necrosis, and brisk mitoses. The tumor cells are positive for synaptophysin, cytokeratin 8/18, cytokeratin AE1/AE3 with week patchy positivity for NSE, chromogranin, and CD 56. The findings were felt to be consistent with a poorly differential carcinoma with the morphology and phenotype raising the possibility of a neuroendocrine carcinoma.    She remains on the ventilator with respiratory failure. her husband, brother, and father are at the bedside.    past medical history:  1. G1 P1     Current facility-administered medications:  .  acetaminophen (TYLENOL) solution 650 mg, 650 mg, Oral, Q6H PRN, Praveen Mannam, MD, 650 mg at 09/08/15 1413 .  [DISCONTINUED] acetaminophen (TYLENOL) tablet 650 mg, 650 mg, Oral, Q6H PRN, 650 mg at 09/07/15 0805 **OR** acetaminophen (TYLENOL) suppository 650 mg, 650 mg, Rectal, Q6H PRN, Toy Baker, MD .  antiseptic oral rinse (CPC / CETYLPYRIDINIUM CHLORIDE 0.05%) solution 7 mL, 7 mL, Mouth Rinse, BID, Robbie Lis, MD, 7 mL at 09/09/15 0924 .  antiseptic oral rinse solution (CORINZ), 7 mL, Mouth Rinse, QID, Chesley Mires, MD, 7 mL at 09/09/15 1509 .  budesonide (PULMICORT) nebulizer solution 0.5 mg, 0.5 mg, Nebulization, BID, Chesley Mires, MD, 0.5 mg at 09/09/15 0936 .  cefTAZidime (FORTAZ) 1 g in dextrose 5 % 50 mL IVPB, 1 g, Intravenous, Q12H, Nikola Glogovac, RPH, 1 g at 09/09/15 1508 .  chlorhexidine gluconate (PERIDEX) 0.12 % solution 15 mL,  15 mL, Mouth Rinse, BID, Chesley Mires, MD, 15 mL at 09/09/15 0925 .  dextrose 5 % solution, ,  Intravenous, Continuous, Praveen Mannam, MD, Last Rate: 75 mL/hr at 09/09/15 1000 .  feeding supplement (NEPRO CARB STEADY) liquid 1,000 mL, 1,000 mL, Oral, Continuous, Satira Anis Ward, RD .  fentaNYL (SUBLIMAZE) injection 25-50 mcg, 25-50 mcg, Intravenous, Q2H PRN, Anders Simmonds, MD, 50 mcg at 09/08/15 2319 .  free water 350 mL, 350 mL, Per Tube, Q4H, Praveen Mannam, MD, 350 mL at 09/09/15 1508 .  heparin ADULT infusion 100 units/mL (25000 units/250 mL), 1,300 Units/hr, Intravenous, Continuous, Nikola Glogovac, RPH, Last Rate: 13 mL/hr at 09/09/15 1614, 1,300 Units/hr at 09/09/15 1614 .  heparin bolus via infusion 4,500 Units, 4,500 Units, Intravenous, Once, [employer], RPH .  hydrALAZINE (APRESOLINE) injection 10 mg, 10 mg, Intravenous, Q6H PRN, Dianne Dun, NP, 10 mg at 09/03/15 1923 .  lactated ringers infusion, , Intravenous, Continuous, Erick Colace, NP, Last Rate: 100 mL/hr at 09/09/15 0845 .  levalbuterol (XOPENEX) nebulizer solution 0.63 mg, 0.63 mg, Nebulization, Q6H, Chesley Mires, MD, 0.63 mg at 09/09/15 1354 .  levalbuterol (XOPENEX) nebulizer solution 1.25 mg, 1.25 mg, Nebulization, Q2H PRN, Chesley Mires, MD, 1.25 mg at 09/02/15 2221 .  midazolam (VERSED) injection 2 mg, 2 mg, Intravenous, Q2H PRN, Chesley Mires, MD .  [DISCONTINUED] ondansetron (ZOFRAN) tablet 4 mg, 4 mg, Oral, Q6H PRN **OR** ondansetron (ZOFRAN) injection 4 mg, 4 mg, Intravenous, Q6H PRN, Toy Baker, MD .  pantoprazole sodium (PROTONIX) 40 mg/20 mL oral suspension 40 mg, 40 mg, Per Tube, Daily, Chesley Mires, MD, 40 mg at 09/09/15 0924 .  simethicone (MYLICON) chewable tablet 80 mg, 80 mg, Oral, QID PRN, Robbie Lis, MD, 80 mg at 08/31/15 1813 .  sodium chloride 0.9 % injection 10-40 mL, 10-40 mL, Intracatheter, Q12H, Robbie Lis, MD, 10 mL at 09/09/15 0925 .  sodium chloride 0.9 % injection 10-40 mL, 10-40 mL, Intracatheter, PRN, Robbie Lis, MD .  sodium chloride 0.9 % injection 3 mL, 3  mL, Intravenous, Q12H, Toy Baker, MD, 3 mL at 09/09/15 0925 .  sodium chloride HYPERTONIC 3 % nebulizer solution 5 mL, 5 mL, Nebulization, Q12H, Erick Colace, NP, 5 mL at 09/09/15 0936:  Family history: Her mother had Hodgkin's lymphoma, her paternal grandfather had leukemia. No other family history of cancer.   Social history: She lives with her Husband and [place]. She works as a Radio producer. She does not use cigarettes at present. She smokes cigarettes as a teenager. Minimal alcohol use.   Review of Systems:  Positives include: Per her husband she felt well prior to admission other than the dyspnea. She was evaluated for "spotting "between menstrual cycles last year.  A complete ROS was otherwise negative.   Physical Exam:  Blood pressure 106/70, pulse 130, temperature 100.4 F (38 C), temperature source Axillary, resp. rate 16, height 5' 5.5" (1.664 m), weight 190 lb 0.6 oz (86.2 kg), last menstrual period 09/14/2015, SpO2 92 %.  HEENT: ET tube in place Lungs: Loud upper airway sounds bilaterally Cardiac: Tachycardia Abdomen: No mass, no hepatosplenomegaly, no apparent ascites Breast: Bilateral breast without mass  Vascular: Trace edema of the arms, no leg edema Lymph nodes: 1 cm right supraclavicular node, no other cervical, supraclavicular, axillary, or inguinal nodes Neurologic: Opens eyes to sternal rub, not following commands Skin: No rash   LABS:   Recent Labs  09/08/15 0400 09/09/15 0530  WBC 18.7* 24.0*  HGB 9.3* 8.7*  HCT 29.9* 28.6*  PLT 287 287     Recent Labs  09/08/15 0400 09/09/15 0530  NA 154* 153*  K 3.5 3.9  CL 113* 113*  CO2 25 22  GLUCOSE 128* 142*  BUN 64* 85*  CREATININE 1.36* 2.26*  CALCIUM 8.3* 9.0    09/03/2015-AFP  4.4  08/31/2015-CA-19-9  453 CEA 125-780 CEA-3.9   RADIOLOGY:  Dg Chest 2 View  08/26/2015  CLINICAL DATA:  Shortness of breath. Pneumonia over the past 4 days. EXAM: CHEST  2 VIEW COMPARISON:   08/26/2015 FINDINGS: Worsening patchy bilateral airspace opacities most confluent in the right middle lobe. The these have a nodular component if and underlying malignancy, fungal disease, or fulminant tuberculosis cannot be excluded particularly if the patient has immunosuppression. Right lower paratracheal density may be incidental but could be from adenopathy. There is fullness of the left hilum. IMPRESSION: 1. Bilateral airspace opacities are worsened, with underlying interstitial accentuation and vague nodularity of these opacities. Although multilobar bacterial pneumonia is a for possibility, other possibilities might include metastatic disease to the lungs, fungal disease, fulminant tuberculosis, or pulmonary hemorrhage. Correlate with the patient's symptoms. Chest CT to be utilized for further characterization if clinically warranted. Electronically Signed   By: Van Clines M.D.   On: 09/06/2015 16:59   Dg Chest 2 View  08/26/2015  CLINICAL DATA:  Persistent wheezing cough and shortness of breath for the past 3 weeks; unresponsive to steroids. EXAM: CHEST  2 VIEW COMPARISON:  None in PACs FINDINGS: There are confluent alveolar opacities occupying much of the right middle lobe with patchy areas of similar opacity in the left lung and right upper lobe. There is no pleural effusion. The heart and pulmonary vascularity are normal. The bony thorax is unremarkable. IMPRESSION: Multifocal alveolar opacities bilaterally most compatible with pneumonia. The right middle lobe appears the most significantly involved. These results will be called to the ordering clinician or representative by the Radiologist Assistant, and communication documented in the PACS or zVision Dashboard. Electronically Signed   By: David  Martinique M.D.   On: 08/26/2015 12:31   Dg Abd 1 View  09/04/2015  CLINICAL DATA:  Orogastric tube placement EXAM: ABDOMEN - 1 VIEW COMPARISON:  None. FINDINGS: There is normal small bowel gas  pattern. Moderate gas noted with transverse colon. There is NG tube coiled within proximal stomach with tip in distal stomach. IMPRESSION: NG tube with tip in distal stomach. Moderate gas in transverse colon. Electronically Signed   By: Lahoma Crocker M.D.   On: 09/04/2015 15:39   Ct Head Wo Contrast  09/07/2015  CLINICAL DATA:  40 year old female, unresponsive. Recently diagnosed with metastatic ovarian cancer, not yet begun treatment. Subsequent encounter. EXAM: CT HEAD WITHOUT CONTRAST TECHNIQUE: Contiguous axial images were obtained from the base of the skull through the vertex without intravenous contrast. COMPARISON:  Face CT 04/25/2006. FINDINGS: Intubated on the scout view. Visualized paranasal sinuses and mastoids are clear. Negative visualized nasopharynx. No acute orbit or scalp soft tissue findings. No acute osseous abnormality identified. No midline shift, ventriculomegaly, mass effect, evidence of mass lesion, intracranial hemorrhage or evidence of cortically based acute infarction. Gray-white matter differentiation is within normal limits throughout the brain. No suspicious intracranial vascular hyperdensity. IMPRESSION: Normal noncontrast CT appearance of the brain. Metastatic disease to the brain from ovarian cancer would be highly unusual, but early metastatic disease cannot be excluded in the absence of intravenous contrast. Electronically Signed   By: Genevie Ann  M.D.   On: 09/07/2015 13:36   Ct Angio Chest Pe W/cm &/or Wo Cm  09/13/2015  CLINICAL DATA:  Patient states ill since Christmas, diagnosed with pneumonia 4 days ago on antibiotics with increase shortness of breath this morning. EXAM: CT ANGIOGRAPHY CHEST WITH CONTRAST TECHNIQUE: Multidetector CT imaging of the chest was performed using the standard protocol during bolus administration of intravenous contrast. Multiplanar CT image reconstructions and MIPs were obtained to evaluate the vascular anatomy. CONTRAST:  153m OMNIPAQUE IOHEXOL 350  MG/ML SOLN COMPARISON:  Chest x-ray 09/12/2015 and 08/26/2015 FINDINGS: Lungs are adequately inflated demonstrate multiple bilateral discrete nodules and nodular regions of airspace opacification. There is masslike consolidation over the right midlung continuous with soft tissue density over the right hilum and subcarinal region. There is no pleural effusion. There is extensive mediastinal and bilateral hilar adenopathy. Subcarinal adenopathy measures 3.2 cm by short axis. A large right peritracheal node measures 2.7 cm by short axis. Adenopathy extends into the superior mediastinum and neck base at the level of the thyroid gland bilateral. Findings are concerning for central right lung neoplasm with metastatic disease. Adenopathy causes narrowing of the distal aspect of the main pulmonary arteries bilaterally. There is associated airway obstruction over the central right middle lobe. Heart is normal in size. No definite evidence of pulmonary embolism. No axillary adenopathy. Images through the upper abdomen demonstrate a few small right pericardial phrenic lymph nodes. Adenopathy adjacent the gastroesophageal junction with lymph node measuring 1.8 cm by short axis. There are degenerative changes of the spine. Review of the MIP images confirms the above findings. IMPRESSION: Large central right middle lobe lung mass without clear borders likely with mild associated postobstructive atelectasis. Bulky mediastinal and bilateral hilar adenopathy as described. Multiple bilateral discrete pulmonary nodules and nodular areas of airspace opacification likely metastatic disease. Right lung mass obstructs the right middle lobe bronchus. Adenopathy causes narrowing of the main pulmonary arteries. Adenopathy over the upper abdomen near the gastroesophageal junction likely metastatic disease. Recommend tissue diagnosis. No evidence of pulmonary embolism. Electronically Signed   By: DMarin OlpM.D.   On: 08/19/2015 20:38    Ct Abdomen Pelvis W Contrast  08/30/2015  CLINICAL DATA:  Pulmonary mass and bilateral metastatic nodules. EXAM: CT ABDOMEN AND PELVIS WITH CONTRAST TECHNIQUE: Multidetector CT imaging of the abdomen and pelvis was performed using the standard protocol following bolus administration of intravenous contrast. CONTRAST:  23mOMNIPAQUE IOHEXOL 300 MG/ML SOLN, 10072mMNIPAQUE IOHEXOL 300 MG/ML SOLN COMPARISON:  Chest CT 08/30/2015 FINDINGS: Lower chest: Mass in the rib RIGHT lower lobe/RIGHT middle lobe is again demonstrated. Multiple bilateral pulmonary nodules. Hepatobiliary: No focal hepatic lesion. No biliary duct dilatation. Gallbladder is normal. Common bile duct is normal. Pancreas: Pancreas is normal. No ductal dilatation. No pancreatic inflammation. Spleen: Normal spleen Adrenals/urinary tract: Adrenal glands and kidneys are normal. The ureters and bladder normal. Stomach/Bowel: Stomach, small bowel, appendix, and cecum are normal. The colon and rectosigmoid colon are normal. Vascular/Lymphatic: Abdominal aorta normal caliber. Gastrohepatic ligament and periaortic retroperitoneal adenopathy noted. Example gastrohepatic lymph node measures 19 mm (image 14, series 2). LEFT periaortic lymph node measures 1.9 cm (image 28, series 2). Reproductive: There is a large mass within the central pelvis superior to the uterus measuring 10 cm by 9.5 cm (image 58, series 2). This mass positioned between the ovaries. RIGHT ovary appears normal. LEFT ovary is not as well-defined. Uterus appears normal. Other: Free fluid the pelvis. Musculoskeletal: No aggressive osseous lesion. IMPRESSION: 1. Large mass  superior to the uterus and position between the ovaries measuring up to 10 cm. Differential includes metastasis from potential bronchogenic carcinoma versus a PRIMARY OVARIAN MALIGNANCY. Lymphoma could also be considered. 2. Retroperitoneal and gastrohepatic ligament metastatic adenopathy. 3. RIGHT lower lobe mass with  bilateral pulmonary metastasis. Differential would include primary bronchogenic carcinoma including SMALL CELL LUNG CARCINOMA. Electronically Signed   By: Suzy Bouchard M.D.   On: 08/30/2015 19:15   US Renal  09/06/2015  CLINICAL DATA:  Acute kidney insufficiency EXAM: RENAL / URINARY TRACT ULTRASOUND COMPLETE COMPARISON:  CT scan 08/30/2015 FINDINGS: Right Kidney: Length: 10.9 cm in length. Echogenicity within normal limits. No mass or hydronephrosis visualized. Left Kidney: Length: 12.4 cm in length. Echogenicity within normal limits. No mass or hydronephrosis visualized. Bladder: The urinary bladder is decompressed with Foley catheter. IMPRESSION: 1. No hydronephrosis. No renal calculi. Bilateral kidney shows normal echogenicity. Decompressed urinary bladder with Foley catheter. Electronically Signed   By: Lahoma Crocker M.D.   On: 09/06/2015 16:38   Ct Biopsy  09/04/2015  CLINICAL DATA:  40 year old female with widespread malignancy of uncertain origin. Primary differential considerations include lung cancer, lymphoma and endometrial cancer. She presents for CT-guided biopsy to establish tissue diagnosis. EXAM: CT BIOPSY Date: 09/04/2015 PROCEDURE: CT-guided biopsy left retroperitoneal lymph node Interventional Radiologist:  Criselda Peaches, MD ANESTHESIA/SEDATION: None.  The patient is intubated and sedated at baseline. MEDICATIONS: None additional TECHNIQUE: Informed consent was obtained from the patient following explanation of the procedure, risks, benefits and alternatives. The patient understands, agrees and consents for the procedure. All questions were addressed. A time out was performed. A planning axial CT scan was performed. The left retroperitoneal lymph node was successfully identified. A suitable skin entry site was selected and marked. The region was then sterilely prepped and draped in standard fashion using Betadine skin prep. Local anesthesia was attained by infiltration with 1%  lidocaine. A small dermatotomy was made. Under intermittent CT fluoroscopic guidance, a 17 gauge trocar needle was advanced into the margin of the nodule. Multiple 18 gauge core biopsies were then coaxially obtained using the BioPince automated biopsy device. Biopsy specimens were placed in saline and delivered to pathology for further analysis. The biopsy device and introducer needle were removed. The patient tolerated the procedure well. COMPLICATIONS: None Estimated blood loss:  0 IMPRESSION: Technically successful CT-guided biopsy left retroperitoneal lymph node. Signed, Criselda Peaches, MD [facility] Electronically Signed   By: Jacqulynn Cadet M.D.   On: 09/04/2015 15:38   Dg Chest Port 1 View  09/09/2015  CLINICAL DATA:  Central catheter placement EXAM: PORTABLE CHEST 1 VIEW COMPARISON:  September 08, 2015 FINDINGS: Endotracheal tube tip is 8.0 cm above the carina. Right subclavian catheter tip is in the superior cava. The new left jugular catheter tip is in the superior vena cava. Nasogastric tube tip and side port are below the diaphragm. No pneumothorax. Multiple pulmonary mass lesions consistent with metastatic disease remain. Widespread interstitial prominence is present bilaterally. Extensive consolidation throughout the right mid lower lung zones remains. Heart size and pulmonary vascularity are normal. No bone lesions. There is evidence of adenopathy, unchanged. IMPRESSION: Left jugular catheter tip in superior vena cava. Other tubes and catheters as described without pneumothorax. Note that the endotracheal tube tip is 8 cm above the carina. Number with Extensive airspace consolidation on the right remains stable. Pulmonary metastases remain stable as does adenopathy. No change in cardiac silhouette. There is widespread interstitial prominence which  potentially may represent lymphangitic spread of tumor. Electronically Signed   By:  Lowella Grip III M.D.   On: 09/09/2015 14:21   Dg Chest Port 1 View  09/08/2015  CLINICAL DATA:  Hypoxia EXAM: PORTABLE CHEST 1 VIEW COMPARISON:  Chest radiograph September 07, 2015 and chest CT August 29, 2015 FINDINGS: Endotracheal tube tip is 3.5 cm above the carina. Nasogastric tube tip and side port are below the diaphragm. Central catheter tip is in the superior vena cava. No pneumothorax. There are multiple pulmonary mass lesions consistent with metastatic disease. There is airspace consolidation throughout most of the right mid and lower lung zone regions. No new opacity is evident. Heart size and pulmonary vascularity are normal. There is extensive adenopathy, better demonstrated by CT. No demonstrable bone lesions. IMPRESSION: Tube and catheter positions as described without pneumothorax. Extensive airspace consolidation on the right. Pulmonary metastases present. No change in cardiac silhouette. Electronically Signed   By: Lowella Grip III M.D.   On: 09/08/2015 07:13   Dg Chest Port 1 View  09/07/2015  CLINICAL DATA:  Pneumonia. EXAM: PORTABLE CHEST 1 VIEW COMPARISON:  09/05/2005 FINDINGS: The support apparatus is stable.  No complicating features. Diffuse pulmonary metastatic disease is again demonstrated. Persistent dense airspace consolidation in the right lower lobe, likely pneumonia. Slight interval improved aeration. IMPRESSION: Stable support apparatus. Diffuse pulmonary metastatic disease. Dense right lower lobe airspace consolidation show slight improvement. Electronically Signed   By: Marijo Sanes M.D.   On: 09/07/2015 15:27   Dg Chest Port 1 View  09/06/2015  CLINICAL DATA:  Respiratory failure. EXAM: PORTABLE CHEST 1 VIEW COMPARISON:  09/05/2015 FINDINGS: Endotracheal tube, enteric catheter, right IJ approach central venous catheter are stable. Cardiomediastinal silhouette is normal. Mediastinal contours appear intact. There is no evidence of pneumothorax. There are stable  bilateral pulmonary masses. The right hemidiaphragm is obscured likely by previously demonstrated right middle lobe mass. Associated right middle and lower lobe atelectasis or postobstructive airspace consolidation cannot be excluded. Osseous structures are without acute abnormality. Soft tissues are grossly normal. IMPRESSION: Stable appearance of the chest with bilateral pulmonary masses, including large right middle lobe mass with likely right middle and lower lobe post obstructive atelectasis or airspace consolidation. Stable support apparatus. Electronically Signed   By: Fidela Salisbury M.D.   On: 09/06/2015 09:33   Dg Chest Port 1 View  09/05/2015  CLINICAL DATA:  Respiratory failure.  Ventilator. EXAM: PORTABLE CHEST 1 VIEW COMPARISON:  09/04/2015 FINDINGS: Endotracheal tube in good position. NG tube in the stomach. Right arm PICC tip in the SVC Elevated right hemidiaphragm with right lower lobe airspace disease unchanged. Nodular lung densities are present bilaterally and unchanged. Mediastinal adenopathy unchanged. No superimposed pneumonia or heart failure. IMPRESSION: Support lines remain in good position Bilateral nodule airspace disease unchanged. Electronically Signed   By: Franchot Gallo M.D.   On: 09/05/2015 07:02   Dg Chest Port 1 View  09/04/2015  CLINICAL DATA:  Intubation, initial encounter EXAM: PORTABLE CHEST 1 VIEW COMPARISON:  09/04/2015 FINDINGS: Cardiomediastinal silhouette is stable. Again noted mediastinal and hilar adenopathy. Right middle lobe central mass is poorly visualized. Bilateral pulmonary nodules consistent with metastatic disease. There is right arm PICC line with tip in SVC. Endotracheal tube in place with tip 1.8 cm above the carina. No pulmonary edema. No pneumothorax IMPRESSION: Bilateral pulmonary nodules again noted. Poorly visualized right middle lobe central mass. Endotracheal tube in place. No pneumothorax. Stable right arm central line position.  Electronically Signed  By: Lahoma Crocker M.D.   On: 09/04/2015 12:47   Dg Chest Port 1 View  09/04/2015  CLINICAL DATA:  Respiratory failure/shortness of breath EXAM: PORTABLE CHEST 1 VIEW COMPARISON:  September 03, 2015 FINDINGS: Central catheter tip is in the superior vena cava. No pneumothorax. There is stable elevation right hemidiaphragm. There are multiple parenchymal lung mass lesions, essentially stable. There is underlying generalized interstitial prominence. No new opacity is seen. Heart size is normal. There is widespread adenopathy. No bone lesions. IMPRESSION: Multiple stable pulmonary nodular lesions consistent with metastatic disease. Diffuse interstitial prominence raises question of lymphangitic spread of tumor. Extensive adenopathy. Stable elevation right hemidiaphragm. No change in cardiac silhouette. Electronically Signed   By: Lowella Grip III M.D.   On: 09/04/2015 07:28   Dg Chest Port 1 View  09/03/2015  CLINICAL DATA:  Low oxygen saturations.  Ovarian cancer . EXAM: PORTABLE CHEST 1 VIEW COMPARISON:  09/01/2015.  CT 08/28/2015 . FINDINGS: Right PICC line stable position. mediastinum and hilar structures are normal. Heart size normal. Multifocal bilateral pulmonary i masses again noted. Small right pleural effusion. No pneumothorax. IMPRESSION: 1. Right PICC line in stable position. 2. Multifocal bilateral pulmonary mass lesions.  No interim change . Electronically Signed   By: Marcello Moores  Register   On: 09/03/2015 07:56   Dg Chest Port 1 View  09/02/2015  CLINICAL DATA:  20-year-old female with decreased O2 sat. History of ovarian cancer and pulmonary masses/passes. EXAM: PORTABLE CHEST 1 VIEW COMPARISON:  CT dated 08/30/2015 FINDINGS: Bilateral pulmonary nodular densities correspond to lesion seen on the CT. Patchy area of airspace opacity in the right mid lung field corresponds to the large mass seen on the CT. There is diffuse increased interstitial prominence. There is blunting of  the right costophrenic angle which may be related to nodules seen on the CT or a small pleural effusion. No pneumothorax. The cardiac silhouette is within normal limits. The osseous structures appear unremarkable. IMPRESSION: Increased interstitial prominence with bilateral pulmonary opacities corresponding to the masses seen on CT. No pneumothorax. Electronically Signed   By: Anner Crete M.D.   On: 09/02/2015 00:06    Assessment and Plan:   1. Metastatic carcinoma with a dominant right lung mass, bilateral lung masses, chest/abdominal lymphadenopathy, and a pelvic mass  Bronchoscopy 09/01/2015 revealed endobronchial lesions at the right upper lobe and right middle lobe, biopsies revealed malignant cells without a definitive diagnosis  CT-guided biopsy of a respiratory lymph node 09/04/2015 confirmed poorly differential carcinoma, with some features of a neuroendocrine carcinoma.  2.  Respiratory failure secondary to #1  3.  Renal failure  4.  Left leg DVT 09/09/2015   Monique [name] has been diagnosed with metastatic carcinoma. There is no clear primary tumor site, but the dominant right lung mass and extensive chest adenopathy is consistent with a lung primary. She has a history of smoking in the past. Small cell lung cancer is a possible primary diagnosis. The differential diagnosis includes a metastatic high-grade carcinoma from another site such as the ovary or GI tract.  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 I recommend treatment with systemic chemotherapy. I discussed the case with several of my colleagues and the consensus is to proceed with etoposide/carboplatin chemotherapy.  I discussed the diagnosis and treatment options with Mr. Myles and other family members. I reviewed the CT images with them. They understand no therapy will be curative. The goal is to obtain a partial remission and allow  her to come off of the ventilator. We may also  consider palliative chest radiation if needed to improve the respiratory failure.  I will review the pathology in the a.m. on 09/10/2015 with the plan to proceed with systemic chemotherapy in the next one to 2 days. We should try to optimize her renal function for chemotherapy.    Betsy Coder, MD 09/09/2015, 4:15 PM

## 2015-09-09 NOTE — Progress Notes (Signed)
Nutrition Follow-up  DOCUMENTATION CODES:   Not applicable  INTERVENTION:  Decrease Nepro to 50 mL/hr which will provide 2160 kcal (102% estimated needs), 97.2 grams of protein, and 872 mL free water.  200 mL free water every 6 hours to provide 800 mL free water due to hypernatremia - RD will continue to monitor for needs  NUTRITION DIAGNOSIS:   Inadequate oral intake related to inability to eat as evidenced by NPO status.  -ongoing  GOAL:   Patient will meet greater than or equal to 90% of their needs  meeting  MONITOR:   TF tolerance, Skin, Vent status, Labs, I & O's  REASON FOR ASSESSMENT:   Consult Enteral/tube feeding initiation and management  ASSESSMENT:   Presented with about the month worsens worsening shortness of breath and wheezing. She has been seen for this by her primary care provider diagnosed with pneumonia based on chest x-ray and completed antibiotics is not improvement repeat chest x-ray showed persistent pneumonia she had a total three courses of antibiotics Including z-pack, levaquin and clarithromycin. Suspect that she has a malignancy of sort, however, it is unclear what the primary is;  Multi-compartmental masses (chest, adenopathy, ovarian).   Patient is currently intubated on ventilator support MV: 11.5 L/min Temp (24hrs), Avg:100.3 F (37.9 C), Min:99 F (37.2 C), Max:102.3 F (39.1 C)  Propofol: None   Pt is tolerating tube feed well. No concerns for aspiration, nausea, or vomiting. Per previous RD note, pt was changed to NePro to help bring electrolytes WNL. Pt's Na is still 154, Cl 113, Ca 9.0, Mg 2.9.   Per CCM Note, pt underwent multiple procedures. Bronchoscopy and pelvic biopsy. They are awaiting heme/oncology input on this, but it appears CA has infiltrated further. They are focusing on encephalopathy at this point.   Follow for possible extubation.  Sedation: Versed, Fentanyl Anti-Hypertensives: Apresoline  Labs reviewed  above.     Diet Order:  Diet NPO time specified  Skin:  Reviewed, no issues  Last BM:  1/22  Height:   Ht Readings from Last 1 Encounters:  08/30/15 5' 5.5" (1.664 m)    Weight:   Wt Readings from Last 1 Encounters:  09/09/15 190 lb 0.6 oz (86.2 kg)    Ideal Body Weight:  57.95 kg (kg)  BMI:  Body mass index is 31.13 kg/(m^2).  Estimated Nutritional Needs:   Kcal:  2100  Protein:  99-123 grams  Fluid:  >/= 2 L/day  EDUCATION NEEDS:   No education needs identified at this time  Satira Anis. Kellyanne Ellwanger, MS, RD LDN After Hours/Weekend Pager [phone]

## 2015-09-09 NOTE — Progress Notes (Signed)
ABG obtained on Pt.  Results Communicated to Dr. Billy Coast, MD, CCM.  Results are as follows pH - 7.212; pCO2 - 59.3; pO2 - 71.7; BiCarbonate - 22.8; SaO2 - 90.9%.  Results will be given to Pulmonary Lab in the morning for manual entry.

## 2015-09-09 NOTE — Progress Notes (Signed)
eLink Physician-Brief Progress Note Patient Name: Monique [name] DOB: 09/08/1975 MRN: [id number]   Date of Service  09/09/2015  HPI/Events of Note  ABG on 65%/PRVC 21/TV 500/P 12 = 7.21/59/71.7/22.8. Patient is guppy breathing. CXR looks terrible with RLL/RML whiteout and diffuse patchy infiltrates throughout the remaining lung areas bilaterally. Ppeak = 44 and Pplat = 36. Very little room to push rate up to 30 to correct respiratory acidosis. Will accept "permissive" hypercapnea and start NaHCO3 IV infusion.   eICU Interventions  Will order: 1. NaHCO3 100 meq IV now. 2. NaHCO3 IV infusion.  3. ABG at 5 AM.     Intervention Category Major Interventions: Acid-Base disturbance - evaluation and management;Respiratory failure - evaluation and management  Lysle Dingwall 09/09/2015, 11:55 PM

## 2015-09-09 NOTE — Progress Notes (Signed)
ANTICOAGULATION CONSULT NOTE - Initial Consult  Pharmacy Consult for heparin IV  Indication: DVT noted in the subclavian vein and axillary vein.  No Known Allergies  Patient Measurements: Height: 5' 5.5" (166.4 cm) Weight: 190 lb 0.6 oz (86.2 kg) IBW/kg (Calculated) : 58.15 Heparin Dosing Weight: 76.8 kg  Vital Signs: Temp: 99.8 F (37.7 C) (01/24 1200) Temp Source: Oral (01/24 1200) BP: 110/73 mmHg (01/24 1200) Pulse Rate: 130 (01/24 0936)  Labs:  Recent Labs  09/07/15 0100  09/07/15 1040 09/07/15 1123 09/07/15 1750 09/08/15 0400 09/09/15 0530  HGB  --   < > 9.3*  --   --  9.3* 8.7*  HCT  --   --  31.0*  --   --  29.9* 28.6*  PLT  --   --  308  --   --  287 287  CREATININE  --   --  1.44*  --   --  1.36* 2.26*  TROPONINI 0.11*  --   --  0.09* 0.09*  --   --   < > = values in this interval not displayed.  Estimated Creatinine Clearance: 36.3 mL/min (by C-G formula based on Cr of 2.26).   Medical History: History reviewed. No pertinent past medical history.  Medications:  Scheduled:  . antiseptic oral rinse  7 mL Mouth Rinse BID  . antiseptic oral rinse  7 mL Mouth Rinse QID  . budesonide (PULMICORT) nebulizer solution  0.5 mg Nebulization BID  . cefTAZidime (FORTAZ)  IV  1 g Intravenous Q12H  . chlorhexidine gluconate  15 mL Mouth Rinse BID  . free water  350 mL Per Tube Q4H  . heparin  4,500 Units Intravenous Once  . levalbuterol  0.63 mg Nebulization Q6H  . pantoprazole sodium  40 mg Per Tube Daily  . sodium chloride  10-40 mL Intracatheter Q12H  . sodium chloride  3 mL Intravenous Q12H  . sodium chloride HYPERTONIC  5 mL Nebulization Q12H    Assessment: Pt is a 40 yo female diagnosed with DVT noted in the subclavian vein and axillary vein. Pt is currently intubated with acute hypoxic respiratory failure secondary to post-obstructive pneumonia, metastatic cancer, and wheeze from extrinsic airway compression. Hgb has been trending down recently, will  monitor closely.     Goal of Therapy:  Heparin level 0.3-0.7 units/ml Monitor platelets by anticoagulation protocol: Yes   Plan:   Give 4500 unit bolus and start heparin drip at 1300 units/hr  Draw heparin level 6 hours after start at 2200  Daily CBC, heparin level    Royetta Asal, PharmD, BCPS Pager [phone] 09/09/2015 2:44 PM

## 2015-09-09 NOTE — Progress Notes (Signed)
VASCULAR LAB PRELIMINARY  PRELIMINARY  PRELIMINARY  PRELIMINARY  Bilateral lower extremity venous duplex and Bilateral upper extremity venous duplex completed.    Preliminary report:    Lower extremity:  Right:  No evidence of DVT, superficial thrombosis, or Baker's cyst.  Left:  DVT noted in the peroneal vein.  No evidence of superficial thrombosis.  No Baker's cyst.   Upper extremity:  Right:  DVT noted in the subclavian vein and axillary vein.  Left:  No evidence of DVT or superficial thrombosis.   Hadyn Blanck, RVT 09/09/2015, 10:31 AM

## 2015-09-09 NOTE — Plan of Care (Signed)
Problem: Tissue Perfusion: Goal: Risk factors for ineffective tissue perfusion will decrease Outcome: Progressing Patient is wearing SCD's on both legs to prevent DVT's.

## 2015-09-10 ENCOUNTER — Inpatient Hospital Stay (HOSPITAL_COMMUNITY): Payer: BC Managed Care – PPO

## 2015-09-10 DIAGNOSIS — C801 Malignant (primary) neoplasm, unspecified: Secondary | ICD-10-CM | POA: Diagnosis present

## 2015-09-10 DIAGNOSIS — I82402 Acute embolism and thrombosis of unspecified deep veins of left lower extremity: Secondary | ICD-10-CM

## 2015-09-10 DIAGNOSIS — N19 Unspecified kidney failure: Secondary | ICD-10-CM

## 2015-09-10 DIAGNOSIS — I82621 Acute embolism and thrombosis of deep veins of right upper extremity: Secondary | ICD-10-CM

## 2015-09-10 DIAGNOSIS — C7A8 Other malignant neuroendocrine tumors: Secondary | ICD-10-CM

## 2015-09-10 DIAGNOSIS — J9690 Respiratory failure, unspecified, unspecified whether with hypoxia or hypercapnia: Secondary | ICD-10-CM

## 2015-09-10 DIAGNOSIS — C7B8 Other secondary neuroendocrine tumors: Secondary | ICD-10-CM

## 2015-09-10 DIAGNOSIS — Z5111 Encounter for antineoplastic chemotherapy: Secondary | ICD-10-CM

## 2015-09-10 DIAGNOSIS — R4182 Altered mental status, unspecified: Secondary | ICD-10-CM

## 2015-09-10 LAB — CBC
HCT: 27.9 % — ABNORMAL LOW (ref 36.0–46.0)
Hemoglobin: 8.3 g/dL — ABNORMAL LOW (ref 12.0–15.0)
MCH: 26.9 pg (ref 26.0–34.0)
MCHC: 29.7 g/dL — AB (ref 30.0–36.0)
MCV: 90.6 fL (ref 78.0–100.0)
PLATELETS: 290 10*3/uL (ref 150–400)
RBC: 3.08 MIL/uL — ABNORMAL LOW (ref 3.87–5.11)
RDW: 16.1 % — ABNORMAL HIGH (ref 11.5–15.5)
WBC: 18.5 10*3/uL — ABNORMAL HIGH (ref 4.0–10.5)

## 2015-09-10 LAB — BLOOD GAS, ARTERIAL
ACID-BASE DEFICIT: 1 mmol/L (ref 0.0–2.0)
Acid-base deficit: 4.5 mmol/L — ABNORMAL HIGH (ref 0.0–2.0)
BICARBONATE: 22.8 meq/L (ref 20.0–24.0)
BICARBONATE: 25.8 meq/L — AB (ref 20.0–24.0)
Drawn by: 422461
Drawn by: 308601
FIO2: 0.65
FIO2: 0.7
LHR: 16 {breaths}/min
LHR: 16 {breaths}/min
O2 SAT: 90.9 %
O2 SAT: 95.3 %
PATIENT TEMPERATURE: 99.4
PATIENT TEMPERATURE: 98.9
PCO2 ART: 59.8 mmHg — AB (ref 35.0–45.0)
PEEP/CPAP: 12 cmH2O
PEEP: 12 cmH2O
PH ART: 7.26 — AB (ref 7.350–7.450)
PO2 ART: 84.2 mmHg (ref 80.0–100.0)
TCO2: 22.5 mmol/L (ref 0–100)
TCO2: 25.2 mmol/L (ref 0–100)
VT: 500 mL
VT: 500 mL
pCO2 arterial: 59.3 mmHg (ref 35.0–45.0)
pH, Arterial: 7.212 — ABNORMAL LOW (ref 7.350–7.450)
pO2, Arterial: 71.7 mmHg — ABNORMAL LOW (ref 80.0–100.0)

## 2015-09-10 LAB — GLUCOSE, CAPILLARY
GLUCOSE-CAPILLARY: 128 mg/dL — AB (ref 65–99)
Glucose-Capillary: 143 mg/dL — ABNORMAL HIGH (ref 65–99)
Glucose-Capillary: 123 mg/dL — ABNORMAL HIGH (ref 65–99)
Glucose-Capillary: 118 mg/dL — ABNORMAL HIGH (ref 65–99)
Glucose-Capillary: 127 mg/dL — ABNORMAL HIGH (ref 65–99)

## 2015-09-10 LAB — BASIC METABOLIC PANEL
Anion gap: 15 (ref 5–15)
BUN: 108 mg/dL — AB (ref 6–20)
CO2: 28 mmol/L (ref 22–32)
CREATININE: 2.55 mg/dL — AB (ref 0.44–1.00)
Calcium: 8.9 mg/dL (ref 8.9–10.3)
Chloride: 105 mmol/L (ref 101–111)
GFR calc Af Amer: 26 mL/min — ABNORMAL LOW (ref >60)
GFR, EST NON AFRICAN AMERICAN: 22 mL/min — AB (ref >60)
GLUCOSE: 140 mg/dL — AB (ref 65–99)
POTASSIUM: 4 mmol/L (ref 3.5–5.1)
SODIUM: 148 mmol/L — AB (ref 135–145)

## 2015-09-10 LAB — PHOSPHORUS: Phosphorus: 4.4 mg/dL (ref 2.5–4.6)

## 2015-09-10 LAB — MAGNESIUM: MAGNESIUM: 3.1 mg/dL — AB (ref 1.7–2.4)

## 2015-09-10 LAB — VANCOMYCIN, RANDOM: VANCOMYCIN RM: 28 ug/mL

## 2015-09-10 LAB — URIC ACID: URIC ACID, SERUM: 11.1 mg/dL — AB (ref 2.3–6.6)

## 2015-09-10 LAB — HEPARIN LEVEL (UNFRACTIONATED)
HEPARIN UNFRACTIONATED: 0.45 [IU]/mL (ref 0.30–0.70)
Heparin Unfractionated: 0.42 IU/mL (ref 0.30–0.70)

## 2015-09-10 LAB — LACTIC ACID, PLASMA: Lactic Acid, Venous: 4.2 mmol/L (ref 0.5–2.0)

## 2015-09-10 MED ORDER — SODIUM CHLORIDE 0.9% FLUSH
10.0000 mL | INTRAVENOUS | Status: DC | PRN
  Administered 2015-09-17 – 2015-09-24 (×2): 10 mL
  Filled 2015-09-10 (×2): qty 10

## 2015-09-10 MED ORDER — ETOPOSIDE CHEMO INJECTION 1 GM/50ML
50.0000 mg/m2 | Freq: Once | INTRAVENOUS | Status: AC
  Administered 2015-09-10: 100 mg via INTRAVENOUS
  Filled 2015-09-10: qty 5

## 2015-09-10 MED ORDER — SODIUM CHLORIDE 0.9 % IV SOLN
Freq: Once | INTRAVENOUS | Status: AC
  Administered 2015-09-10: 17:00:00 via INTRAVENOUS

## 2015-09-10 MED ORDER — SODIUM CHLORIDE 0.9% FLUSH
3.0000 mL | INTRAVENOUS | Status: DC | PRN
  Administered 2015-09-23: 3 mL via INTRAVENOUS
  Filled 2015-09-10: qty 3

## 2015-09-10 MED ORDER — SODIUM CHLORIDE 0.9 % IV SOLN
6.0000 mg | INTRAVENOUS | Status: AC
  Administered 2015-09-10: 6 mg via INTRAVENOUS
  Filled 2015-09-10: qty 4

## 2015-09-10 MED ORDER — HEPARIN SOD (PORK) LOCK FLUSH 100 UNIT/ML IV SOLN
500.0000 [IU] | Freq: Once | INTRAVENOUS | Status: DC | PRN
  Filled 2015-09-10: qty 5

## 2015-09-10 MED ORDER — SODIUM BICARBONATE 8.4 % IV SOLN
INTRAVENOUS | Status: AC
  Filled 2015-09-10: qty 50

## 2015-09-10 MED ORDER — SODIUM CHLORIDE 0.9 % IV SOLN
259.6000 mg | Freq: Once | INTRAVENOUS | Status: AC
  Administered 2015-09-10: 260 mg via INTRAVENOUS
  Filled 2015-09-10: qty 26

## 2015-09-10 MED ORDER — ALTEPLASE 2 MG IJ SOLR
2.0000 mg | Freq: Once | INTRAMUSCULAR | Status: DC | PRN
  Filled 2015-09-10: qty 2

## 2015-09-10 MED ORDER — PALONOSETRON HCL INJECTION 0.25 MG/5ML
0.2500 mg | Freq: Once | INTRAVENOUS | Status: AC
  Administered 2015-09-10: 0.25 mg via INTRAVENOUS
  Filled 2015-09-10: qty 5

## 2015-09-10 MED ORDER — SODIUM CHLORIDE 0.9 % IV SOLN
10.0000 mg | Freq: Once | INTRAVENOUS | Status: AC
  Administered 2015-09-10: 10 mg via INTRAVENOUS
  Filled 2015-09-10: qty 1

## 2015-09-10 MED ORDER — HEPARIN SOD (PORK) LOCK FLUSH 100 UNIT/ML IV SOLN
250.0000 [IU] | Freq: Once | INTRAVENOUS | Status: DC | PRN
  Filled 2015-09-10: qty 3

## 2015-09-10 MED ORDER — HOT PACK MISC ONCOLOGY
1.0000 | Freq: Once | Status: AC | PRN
  Filled 2015-09-10: qty 1

## 2015-09-10 NOTE — Progress Notes (Signed)
  Pharmacy Antibiotic Follow-up Note  Monique [name] is a 40 y.o. year-old female admitted on 09/14/2015.  The patient is currently on day 5 of vancomycin and ceftazidime.  Assessment/Plan: Cont to hold Vancomycin and recheck random level in 24hrs. Cont Cont Fortaz 1g q12h for now, may need to adjust if SCr cont to rise.  Consider micafungin for candida, will discuss with CCM.   Temp (24hrs), Avg:100.1 F (37.8 C), Min:98.7 F (37.1 C), Max:102.7 F (39.3 C)   Recent Labs Lab 09/06/15 0420 09/07/15 1040 09/08/15 0400 09/09/15 0530 09/10/15 0530  WBC 21.1* 20.0* 18.7* 24.0* 18.5*     Recent Labs Lab 09/06/15 0420 09/07/15 1040 09/08/15 0400 09/09/15 0530 09/10/15 0530  CREATININE 1.70* 1.44* 1.36* 2.26* 2.55*   Estimated Creatinine Clearance: 32.1 mL/min (by C-G formula based on Cr of 2.55).    No Known Allergies  Antimicrobials this admission: 1/13 >> Rocephin >> 1/13 1/13 >> Zithromax >> 1/13 1/14 >> Vanc >> 1/19, 1/21 >> Vanc 1/14 >> Zosyn >> 1/20 1/21 >> Tressie Ellis >>  Levels/dose changes this admission: 1/15 1900 VT: 15 on 1g IV q8h 1/18 1130 VT: 15 on 1g IV q8h 1/24 1500 VT: 36 on '750mg'$  IV q8h = hold Vanc. Decrease ceftazidime to 1 gr IV q12h  1/25 0500 Vrandom: 28. Ke ~0.017, T1/2 ~41hrs. Cont to hold and recheck VR in 24hrs.  Microbiology results: 1/14 HIV screen: neg 1/14 S. pneumo UAg: neg 1/14 Legionella UAg: neg 1/14 MRSA PCR: neg 1/15 Cdiff PCR: negative 1/13 blood x2: NGF 1/21 Trach asp: moderate yeast c/w candida, final 1/21 blood x2: ngtd 1/22 bronch washings: 5K colonies/ml, Candida, final  Thank you for allowing pharmacy to be a part of this patient's care.  Romeo Rabon, PharmD, pager [phone]. 09/10/2015,7:39 AM.

## 2015-09-10 NOTE — Progress Notes (Signed)
BSA, Doses/Dilutions of Carboplatin, Etoposide completed/awaiting verification from 2nd chemo certified RN Jena Gauss.

## 2015-09-10 NOTE — Progress Notes (Signed)
eLink Physician-Brief Progress Note Patient Name: Monique [name] DOB: [birth year] MRN: [id number]   Date of Service  09/10/2015  HPI/Events of Note  Lactic Acid = 4.2. Hgb = 8.2  eICU Interventions  Will order: 1. Monitor CVP.  If CVP < 10 , give isotonic fluid. If CVP > 10, transfuse and check COOX.     Intervention Category Major Interventions: Acid-Base disturbance - evaluation and management  Sommer,Steven Eugene 09/10/2015, 6:26 AM

## 2015-09-10 NOTE — Progress Notes (Signed)
PCCM PROGRESS NOTE  ADMISSION DATE: 08/26/2015 CONSULT DATE: 08/30/2015 REFERRING PROVIDER: Dr. Roel Cluck, [facility]  CC: Short of breath  SUBJECTIVE:  Looks worse .   VITAL SIGNS: BP 106/60 mmHg  Pulse 134  Temp(Src) 98.7 F (37.1 C) (Axillary)  Resp 31  Ht 5' 5.5" (1.664 m)  Wt 190 lb 0.6 oz (86.2 kg)  BMI 31.13 kg/m2  SpO2 94%  LMP 09/09/2015  INTAKE/OUTPUT: I/O last 3 completed shifts: In: 8668 [I.V.:5142.1; NG/GT:3225.8; IV Piggyback:300] Out: 1025 [Urine:775; Emesis/NG output:250]  General: Sedated, on vent.  Has marked weakness. RUE seems weaker  HEENT: ETT, No JVD Cardiac: regular, No MRG Chest: B/L rhonchi, decreased on right  Abd: Distended. + BS Ext: 1+ edema Neuro: right side weaker, Does nod to questions when asked about pain.  Skin: no rashes  CBC Recent Labs     09/08/15  0400  09/09/15  0530  09/10/15  0530  WBC  18.7*  24.0*  18.5*  HGB  9.3*  8.7*  8.3*  HCT  29.9*  28.6*  27.9*  PLT  287  287  290    Coag's No results for input(s): APTT, INR in the last 72 hours.  BMET Recent Labs     09/08/15  0400  09/09/15  0530  09/10/15  0530  NA  154*  153*  148*  K  3.5  3.9  4.0  CL  113*  113*  105  CO2  '25  22  28  '$ BUN  64*  85*  108*  CREATININE  1.36*  2.26*  2.55*  GLUCOSE  128*  142*  140*    Electrolytes Recent Labs     09/08/15  0400  09/09/15  0530  09/10/15  0530  CALCIUM  8.3*  9.0  8.9  MG  2.9*   --   3.1*  PHOS  3.4   --   4.4    Sepsis Markers Recent Labs     09/08/15  0400  PROCALCITON  69.11    ABG Recent Labs     09/07/15  1530  09/09/15  2325  09/10/15  0536  PHART  7.309*  7.212*  7.260*  PCO2ART  55.0*  59.3*  59.8*  PO2ART  51.0*  71.7*  84.2    Liver Enzymes Recent Labs     09/08/15  0400  09/09/15  0530  AST  140*  122*  ALT  24  24  ALKPHOS  128*  134*  BILITOT  1.8*  1.4*  ALBUMIN  1.8*  1.7*    Cardiac Enzymes Recent Labs     09/07/15  1123  09/07/15  1750  TROPONINI  0.09*   0.09*    Glucose Recent Labs     09/09/15  0825  09/09/15  1140  09/09/15  1542  09/09/15  1923  09/10/15  0333  09/10/15  0839  GLUCAP  136*  152*  144*  117*  143*  123*    Imaging Dg Chest Port 1 View  09/10/2015  CLINICAL DATA:  Respiratory failure. EXAM: PORTABLE CHEST 1 VIEW COMPARISON:  09/09/2015. FINDINGS: Interim removal of right PICC line. Endotracheal tube has been advanced slightly, its tip is 4.6 cm above the carina . Heart size stable. Persistent bilateral pulmonary masses consistent with metastatic disease. Persistent consolidation right lower lobe. Persistent right pleural effusion. No interim change. No pneumothorax. NG tube, left IJ line in stable position . No acute bony abnormality. IMPRESSION: 1. Interim  removal of right PICC line. Interim advancement of endotracheal tube, its tip is in good anatomic position 4.6 cm above the carina. Left IJ line and NG tube in stable position. No pneumothorax. 2. Persistent bilateral pulmonary masses consistent with metastatic disease. 3. Persistent consolidation of the right lower lobe suggesting pneumonia. Persistent right pleural effusion. No interim change. Electronically Signed   By: Marcello Moores  Register   On: 09/10/2015 07:10   Dg Chest Port 1 View  09/09/2015  CLINICAL DATA:  Central catheter placement EXAM: PORTABLE CHEST 1 VIEW COMPARISON:  September 08, 2015 FINDINGS: Endotracheal tube tip is 8.0 cm above the carina. Right subclavian catheter tip is in the superior cava. The new left jugular catheter tip is in the superior vena cava. Nasogastric tube tip and side port are below the diaphragm. No pneumothorax. Multiple pulmonary mass lesions consistent with metastatic disease remain. Widespread interstitial prominence is present bilaterally. Extensive consolidation throughout the right mid lower lung zones remains. Heart size and pulmonary vascularity are normal. No bone lesions. There is evidence of adenopathy, unchanged. IMPRESSION:  Left jugular catheter tip in superior vena cava. Other tubes and catheters as described without pneumothorax. Note that the endotracheal tube tip is 8 cm above the carina. Number with Extensive airspace consolidation on the right remains stable. Pulmonary metastases remain stable as does adenopathy. No change in cardiac silhouette. There is widespread interstitial prominence which potentially may represent lymphangitic spread of tumor. Electronically Signed   By: Lowella Grip III M.D.   On: 09/09/2015 14:21    CULTURES: 1/13 Blood >> neg 1/14 Pneumococcal Ag >> negative 1/14 Legionella Ag >> negative 1/15 C diff PCR >> negative 1/22 BAL>>>neg  BCX2 1/21>>>  ANTIBIOTICS: 1/13 Rocephin >> 1/13 1/13 Zithromax >> 1/13 1/14 Vancomycin >> 1/19, restart 1/21>>> 1/14 Zosyn >> 1/20. 1/21 Ceftaz >>  LINES/TUBES: 1/15 Rt PICC >> 1/24 Left IJ CVL 1/24>>>  STUDIES: 1/13 CT chest >> multiple b/l nodules, mass like consolidation Rt mid lung, Rt hilar and subcarinal mass, Lt hilar LAN, 2.7 cm Rt paratracheal LAN 1/14 CT abd/pelvis >> 10 cm mass superior to uterus 1/15 Tumor markers >> CA 19-9 453, CEA 3.9, CA 125 780.8 1/16 Bronchoscopy >> crush artifact ?small cell lung cancer (non diagnostic)  EVENTS: 1/13 Admit 1/15 Gyn oncology consulted 1/16 Bronchoscopy in ICU; Placed on BiPAP for wheezing, dyspnea 1/19 IR biopsy>>>Poorly differentiated high Grade Carcinoma.  >stains favor Neuroendocrine. TTF stain was NEGATIVE 1/22 bronch cytology:   DISCUSSION: 40 yo female with progressive dyspnea, wheezing.  She was found to have abnormal CT chest/abdomen/pelvis from metastatic cancer. 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 Long d/w pt's family: re: AKI, uremia, worsening MS, worsening aeration. We were very clear that this is very much a heroic attempt and the likelihood that outcome will be poor. We will cont supportive care, limit nephro-toxins,  start chemo. Have made her limited code again. Prognosis is very poor.   ASSESSMENT/PLAN:  PULMONARY A: Acute hypoxic respiratory failure 2nd to post-obstructive pneumonia, metastatic cancer, and wheeze from extrinsic airway compression. Probably element of PE O2 requirements and CXR looking worse.   P: - cont full vent support - continue xopenex, pulmicort, add hypertonic saline nebs - PAD protocol  - wean FIO2 and PEEP as able   CARDIAC A: Persistent  sinus tachycardia. P: - Monitor hemodynamics.  RENAL A: AKI Hypernatremia-->improved Hyperchloremia -->resolved.  P: Cont D5W & Free water replacement Renal dose  meds DC vanc Not a candidate for HD F/u chemistry in am   GASTROENTEROLOGY A: Nutrition. 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 DVT: left peroneal vein and Right UE extending into the Subclavian -->picc removed.  P: Emergent chemotherapy  Heparin gtt for PE/DVT Further recs per heme/onc   INFECTION A: Sepsis 2nd to post-obstructive pneumonia. Fevers and elevated LA are likely secondary to tumor P: - Follow cultures and procalcitonin. - Restarted on broad spectrum antibiotics 1/21; will stop Vanc given no MRSA  ENDOCRINE A: No acute issues. P: - monitor blood glucose on BMET  NEUROLOGY A: Cancer pain. Acute Encephalopathy. Suspect that this is r/t her AKI and uremia  P: - fentanyl for pain    Husband updated at bedside 1/24, updated at length re: goals of care   Erick Colace ACNP-BC [place] Pager # [phone] OR # [phone] if no answer   09/10/2015, 10:18 AM

## 2015-09-10 NOTE — Progress Notes (Signed)
Chemotherapy education included patient's husband, brother and father. Chemotherapy consent signed by spouse.

## 2015-09-10 NOTE — Progress Notes (Signed)
Chemotherapy administration of Carboplatin and Etoposide completed. Vital signs remained stable throughout administration.  Family at bedside.  Questions answered during administration process.   Staff and family instructed on chemotherapy precautions.  Yellow bin, chemo gowns in room, green chemo sign posted by door.

## 2015-09-10 NOTE — Progress Notes (Signed)
   09/10/15 0900  Clinical Encounter Type  Visited With Family  Visit Type Initial;Psychological support;Spiritual support;Critical Care  Referral From Nurse  Consult/Referral To Chaplain  Spiritual Encounters  Spiritual Needs Emotional;Other (Comment) (Pastoral conversation/support)  Stress Factors  Patient Stress Factors None identified  Family Stress Factors Exhausted;Health changes;Loss;Other (Comment)   I followed-up with the patient's family per previous visit the day before. During the visit the day before I worked with the patient's son. I gave him coloring books, crayons and a hospital teddy bear kit.  Today, the patient's husband looked even more exhausted and the family was extremely frustrated by the night nurses treatment of the patient.  The husband stated that his wife had not been changed twice. The husband stated that the nurse came in to check her vitals around 5 am and didn't check the patient who was lying in her own feces.  He also stated that the Charge nurse spoke to him in a disrespectful manner.  The patient's brother stated that they have enough to deal with than to worry about her basic patient care.  I notified the patient's nurse today. The husband stated that he spoke with the Nurse Administrator this morning who eased his concerns.  Follow up will be needed for the patient and her family.    [name]  Spiritual Care and [place] Department  Pager: [phone] Please contact the chaplains if further assistance is needed.

## 2015-09-10 NOTE — Progress Notes (Signed)
CRITICAL VALUE ALERT  Critical value received:  Lactic Acid 4.2  Date of notification:  09/10/2015   Time of notification:  0626  Critical value read back: yes  Nurse who received alert:  Gae Gallop, RN  MD notified (1st page):  Boone Master  Time of first page:  0626  MD notified (2nd page):  Time of second page:  Responding MD:  Boone Master  Time MD responded:  [phone]

## 2015-09-10 NOTE — Progress Notes (Signed)
IP PROGRESS NOTE  Subjective:   She remains on the ventilator. Her husband is at the bedside.  Objective: Vital signs in last 24 hours: Blood pressure 107/56, pulse 134, temperature 102.6 F (39.2 C), temperature source Axillary, resp. rate 23, height 5' 5.5" (1.664 m), weight 190 lb 0.6 oz (86.2 kg), last menstrual period 09/01/2015, SpO2 93 %.  Intake/Output from previous day: 01/24 0701 - 01/25 0700 In: 6107.1 [I.V.:3942.1; NG/GT:2065; IV Piggyback:100] Out: 740 [Urine:740]  Physical Exam:  HEENT: ET tube in place Lungs: Irregular respirations, loud upper airway sounds bilaterally Cardiac: Tachycardia Abdomen: No hepatomegaly, mildly distended Extremities: Edema at the right arm Neurologic: She does not follow commands. The pupils are equal and reactive. She does not open her eyes to a sternal rub  Left IJ catheter site without erythema  Lab Results:  Recent Labs  09/09/15 0530 09/10/15 0530  WBC 24.0* 18.5*  HGB 8.7* 8.3*  HCT 28.6* 27.9*  PLT 287 290    BMET  Recent Labs  09/09/15 0530 09/10/15 0530  NA 153* 148*  K 3.9 4.0  CL 113* 105  CO2 22 28  GLUCOSE 142* 140*  BUN 85* 108*  CREATININE 2.26* 2.55*  CALCIUM 9.0 8.9    Studies/Results: Dg Chest Port 1 View  09/10/2015  CLINICAL DATA:  Respiratory failure. EXAM: PORTABLE CHEST 1 VIEW COMPARISON:  09/09/2015. FINDINGS: Interim removal of right PICC line. Endotracheal tube has been advanced slightly, its tip is 4.6 cm above the carina . Heart size stable. Persistent bilateral pulmonary masses consistent with metastatic disease. Persistent consolidation right lower lobe. Persistent right pleural effusion. No interim change. No pneumothorax. NG tube, left IJ line in stable position . No acute bony abnormality. IMPRESSION: 1. Interim removal of right PICC line. Interim advancement of endotracheal tube, its tip is in good anatomic position 4.6 cm above the carina. Left IJ line and NG tube in stable position.  No pneumothorax. 2. Persistent bilateral pulmonary masses consistent with metastatic disease. 3. Persistent consolidation of the right lower lobe suggesting pneumonia. Persistent right pleural effusion. No interim change. Electronically Signed   By: Marcello Moores  Register   On: 09/10/2015 07:10   Dg Chest Port 1 View  09/09/2015  CLINICAL DATA:  Central catheter placement EXAM: PORTABLE CHEST 1 VIEW COMPARISON:  September 08, 2015 FINDINGS: Endotracheal tube tip is 8.0 cm above the carina. Right subclavian catheter tip is in the superior cava. The new left jugular catheter tip is in the superior vena cava. Nasogastric tube tip and side port are below the diaphragm. No pneumothorax. Multiple pulmonary mass lesions consistent with metastatic disease remain. Widespread interstitial prominence is present bilaterally. Extensive consolidation throughout the right mid lower lung zones remains. Heart size and pulmonary vascularity are normal. No bone lesions. There is evidence of adenopathy, unchanged. IMPRESSION: Left jugular catheter tip in superior vena cava. Other tubes and catheters as described without pneumothorax. Note that the endotracheal tube tip is 8 cm above the carina. Number with Extensive airspace consolidation on the right remains stable. Pulmonary metastases remain stable as does adenopathy. No change in cardiac silhouette. There is widespread interstitial prominence which potentially may represent lymphangitic spread of tumor. Electronically Signed   By: Lowella Grip III M.D.   On: 09/09/2015 14:21    Medications: I have reviewed the patient's current medications.  Assessment/Plan:  1. 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  2. Respiratory failure secondary to #1  3. Renal failure  4. Left leg and right upper extremity DVTs 09/09/2015  5.  Altered mental status  Ms. Sias has progressive respiratory failure and renal failure. She is unresponsive today. I discussed the case with the critical care team. They feel the mental status changes most likely secondary to renal failure/respiratory failure and a metabolic syndrome. She could have unrecognized CNS metastases. The renal failure may be related to contrast nephropathy, polypharmacy, tumor lysis, or potentially tumor involving the kidneys. The critical care teams feels it is unlikely there is any intervention that will improve the renal function.   I reviewed the pathology results with Drs. Manny and Smir. They confirm the high-grade nature of the malignancy involving the chest and right peritoneal lymph node. 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 The most likely primary tumor sites are the lung and ovary.  I discussed the case with multiple colleagues. The consensus is to proceed with a course of etoposide/carboplatin chemotherapy.   I discussed the prognosis and treatment options at length with Mr. Height. He understands no therapy will be curative and it is unlikely she will survive this hospital admission. The hope is is that a response to chemotherapy may allow her to come off of the ventilator.  He understands she is at increased risk for toxicity from chemotherapy secondary to renal failure and malnutrition.  We discussed the potential toxicities associated with the etoposide/carboplatin regimen including the chance for nausea/vomiting, an allergic reaction, alopecia, mucositis, diarrhea, and hematologic toxicity. We  discussed the potential for bone pain, a rash, and splenic rupture with G-CSF.  We will check a uric acid level and prescribed rasburicase as indicated. Chemotherapy will be dose reduced based on the renal failure.    LOS: 11 days   Rafeal Skibicki  09/10/2015, 12:41 PM

## 2015-09-10 NOTE — Progress Notes (Addendum)
Spoke with Dr. Benay Spice:   1. Give chemo despite labs and vitals. 2. Pharmacy to dose rasburicase x 1 if uric acid is above the normal range -> Uric acid reported as 11.1, give rasburicase '6mg'$  IVPB STAT.  Romeo Rabon, PharmD, pager [phone]. 09/10/2015,2:42 PM.

## 2015-09-10 NOTE — Progress Notes (Signed)
ANTICOAGULATION CONSULT NOTE - Follow Up Consult  Pharmacy Consult for Heparin Indication: DVT noted in the subclavian vein and axillary vein.  No Known Allergies  Patient Measurements: Height: 5' 5.5" (166.4 cm) Weight: 190 lb 0.6 oz (86.2 kg) IBW/kg (Calculated) : 58.15 Heparin Dosing Weight:   Vital Signs: Temp: 98.9 F (37.2 C) (01/24 2316) Temp Source: Axillary (01/24 2316) BP: 120/70 mmHg (01/25 0453) Pulse Rate: 134 (01/25 0453)  Labs:  Recent Labs  09/07/15 1040 09/07/15 1123 09/07/15 1750 09/08/15 0400 09/09/15 0530 09/10/15 0048  HGB 9.3*  --   --  9.3* 8.7*  --   HCT 31.0*  --   --  29.9* 28.6*  --   PLT 308  --   --  287 287  --   HEPARINUNFRC  --   --   --   --   --  0.42  CREATININE 1.44*  --   --  1.36* 2.26*  --   TROPONINI  --  0.09* 0.09*  --   --   --     Estimated Creatinine Clearance: 36.3 mL/min (by C-G formula based on Cr of 2.26).   Medications:  Infusions:  . dextrose 75 mL/hr at 09/09/15 2245  . feeding supplement (NEPRO CARB STEADY) 1,000 mL (09/09/15 1629)  . heparin 1,300 Units/hr (09/09/15 1614)  .  sodium bicarbonate 150 mEq in sterile water 1000 mL infusion 100 mL/hr at 09/10/15 0050    Assessment: Patient with heparin level at goal.  No heparin issues noted.  Goal of Therapy:  Heparin level 0.3-0.7 units/ml Monitor platelets by anticoagulation protocol: Yes   Plan:  Continue heparin drip at current rate Recheck level with AM labs  Tyler Deis, Shea Stakes Crowford 09/10/2015,5:50 AM

## 2015-09-10 NOTE — Progress Notes (Deleted)
   09/10/15 0900  Clinical Encounter Type  Visited With Family  Visit Type Initial;Psychological support;Spiritual support;Critical Care  Referral From Nurse  Consult/Referral To Chaplain  Spiritual Encounters  Spiritual Needs Emotional;Other (Comment) (Pastoral conversation/support)  Stress Factors  Patient Stress Factors None identified  Family Stress Factors Exhausted;Health changes;Loss;Other (Comment)   I visited with the patient and her family after being referred by the Charge nurse.  The family were at the bedside upon my arrival. The patient's family from the [place] had arrived and her father was at the bedside holding the patient's hand.  The patient's 91 year old son was sitting in the chair playing video games.  The husband of the patient was visibly exhausted, but stated that he had good support. He was worried about his son and how he would take the possibility of losing his mother.  I gathered together some coloring books, crayons and a teddy bear hospital kit for him to play with. The patient's son and I spent some time together. When asked what makes him happy, he stated that being there with his mom makes him happy.  The patient's mother-in-law stated that she has been taking care of the patient's son. They have been exhausted, but got adequate sleep the previous night.  The mother-in-law asked me if there were any support materials for grief specifically aimed for children. I had already gathered those together and gave them to the nurse in the event that something happens during the night when I am not available.  Follow up will be needed.    [place] M.Div.

## 2015-09-11 LAB — COMPREHENSIVE METABOLIC PANEL
ALBUMIN: 2 g/dL — AB (ref 3.5–5.0)
ALT: 46 U/L (ref 14–54)
AST: 224 U/L — AB (ref 15–41)
Alkaline Phosphatase: 228 U/L — ABNORMAL HIGH (ref 38–126)
Anion gap: 19 — ABNORMAL HIGH (ref 5–15)
BUN: 111 mg/dL — AB (ref 6–20)
CHLORIDE: 95 mmol/L — AB (ref 101–111)
CO2: 29 mmol/L (ref 22–32)
CREATININE: 2.94 mg/dL — AB (ref 0.44–1.00)
Calcium: 8.8 mg/dL — ABNORMAL LOW (ref 8.9–10.3)
GFR calc Af Amer: 22 mL/min — ABNORMAL LOW (ref >60)
GFR, EST NON AFRICAN AMERICAN: 19 mL/min — AB (ref >60)
GLUCOSE: 156 mg/dL — AB (ref 65–99)
Potassium: 4.8 mmol/L (ref 3.5–5.1)
Sodium: 143 mmol/L (ref 135–145)
Total Bilirubin: 1.1 mg/dL (ref 0.3–1.2)
Total Protein: 6 g/dL — ABNORMAL LOW (ref 6.5–8.1)

## 2015-09-11 LAB — GLUCOSE, CAPILLARY
GLUCOSE-CAPILLARY: 163 mg/dL — AB (ref 65–99)
GLUCOSE-CAPILLARY: 139 mg/dL — AB (ref 65–99)
Glucose-Capillary: 124 mg/dL — ABNORMAL HIGH (ref 65–99)
Glucose-Capillary: 142 mg/dL — ABNORMAL HIGH (ref 65–99)
Glucose-Capillary: 144 mg/dL — ABNORMAL HIGH (ref 65–99)
Glucose-Capillary: 150 mg/dL — ABNORMAL HIGH (ref 65–99)

## 2015-09-11 LAB — CULTURE, BLOOD (ROUTINE X 2)
CULTURE: NO GROWTH
CULTURE: NO GROWTH

## 2015-09-11 LAB — URIC ACID: URIC ACID, SERUM: 4.3 mg/dL (ref 2.3–6.6)

## 2015-09-11 LAB — HEPARIN LEVEL (UNFRACTIONATED): Heparin Unfractionated: 0.49 IU/mL (ref 0.30–0.70)

## 2015-09-11 MED ORDER — FUROSEMIDE 10 MG/ML IJ SOLN
40.0000 mg | Freq: Once | INTRAMUSCULAR | Status: AC
  Administered 2015-09-11: 40 mg via INTRAVENOUS
  Filled 2015-09-11: qty 4

## 2015-09-11 MED ORDER — FUROSEMIDE 10 MG/ML IJ SOLN
INTRAMUSCULAR | Status: AC
  Administered 2015-09-11: 12:00:00
  Filled 2015-09-11: qty 4

## 2015-09-11 NOTE — Progress Notes (Signed)
Date: September 11, 2015 Chart reviewed for concurrent status and case management needs. Will continue to follow patient for changes and needs: Remains full vent support, temp [address], [place], [place], [place],   [phone]

## 2015-09-11 NOTE — Progress Notes (Signed)
ANTICOAGULATION CONSULT NOTE - Follow up  Pharmacy Consult for heparin IV  Indication: DVT noted in the subclavian vein and axillary vein.  No Known Allergies  Patient Measurements: Height: 5' 5.5" (166.4 cm) Weight: 215 lb 13.3 oz (97.9 kg) IBW/kg (Calculated) : 58.15 Heparin Dosing Weight: 76.8 kg  Vital Signs: Temp: 99.5 F (37.5 C) (01/26 0800) Temp Source: Axillary (01/26 0800) BP: 115/70 mmHg (01/26 0700) Pulse Rate: 122 (01/26 0409)  Labs:  Recent Labs  09/09/15 0530 09/10/15 0048 09/10/15 0530 09/10/15 0945 09/11/15 0410  HGB 8.7*  --  8.3*  --   --   HCT 28.6*  --  27.9*  --   --   PLT 287  --  290  --   --   HEPARINUNFRC  --  0.42  --  0.45 0.49  CREATININE 2.26*  --  2.55*  --   --     Estimated Creatinine Clearance: 34.3 mL/min (by C-G formula based on Cr of 2.55).   Medical History: History reviewed. No pertinent past medical history.  Medications:  Scheduled:  . antiseptic oral rinse  7 mL Mouth Rinse BID  . antiseptic oral rinse  7 mL Mouth Rinse QID  . budesonide (PULMICORT) nebulizer solution  0.5 mg Nebulization BID  . cefTAZidime (FORTAZ)  IV  1 g Intravenous Q12H  . chlorhexidine gluconate  15 mL Mouth Rinse BID  . free water  350 mL Per Tube Q4H  . levalbuterol  0.63 mg Nebulization Q6H  . pantoprazole sodium  40 mg Per Tube Daily  . sodium chloride  10-40 mL Intracatheter Q12H  . sodium chloride  3 mL Intravenous Q12H    Assessment: Pt is a 40 yo female diagnosed with DVT noted in the subclavian vein and axillary vein. Pt is currently intubated with acute hypoxic respiratory failure secondary to post-obstructive pneumonia, metastatic cancer, and wheeze from extrinsic airway compression. Hgb has been trending down recently, will monitor closely.   Today, 09/11/2015:  Heparin level remains in the target range on 1300units/hr.  H/H and platelets are stable. No bleeding reported/documented.  SCr pending, has been rising.  Goal of  Therapy:  Heparin level 0.3-0.7 units/ml Monitor platelets by anticoagulation protocol: Yes   Plan:  Cont heparin infusion at 1300 units/hr. Heparin level daily. Check CBC q24h while on heparin. F/u daily.  Romeo Rabon, PharmD, pager [phone]. 09/11/2015,8:31 AM.

## 2015-09-11 NOTE — Progress Notes (Signed)
Received a verbal order from Dr. Benay Spice this AM that no further chemo will be given due to worsening renal function and hepatic function.   Romeo Rabon, PharmD, pager [phone]. 09/11/2015,1:49 PM.

## 2015-09-11 NOTE — Progress Notes (Signed)
PCCM PROGRESS NOTE  ADMISSION DATE: 08/17/2015 CONSULT DATE: 08/30/2015 REFERRING PROVIDER: Dr. Roel Cluck, [facility]  CC: Short of breath  SUBJECTIVE:  Looks worse .  Agonal breathing.  VITAL SIGNS: BP 115/70 mmHg  Pulse 122  Temp(Src) 99.5 F (37.5 C) (Axillary)  Resp 16  Ht 5' 5.5" (1.664 m)  Wt 215 lb 13.3 oz (97.9 kg)  BMI 35.36 kg/m2  SpO2 92%  LMP 09/02/2015  INTAKE/OUTPUT: I/O last 3 completed shifts: In: 10679.7 [I.V.:6679.7; NG/GT:3800; IV Piggyback:200] Out: 930 [Urine:730; Stool:200]  General: No sedation x 2 days, on vent.  NO response to sternal rub HEENT: ETT, No JVD Cardiac: regular, No MRG Chest: B/L rhonchi, decreased in bases Abd: Distended. + BS Ext: 1+ edema Neuro: no response to sternal rub Skin: no rashes  CBC Recent Labs     09/09/15  0530  09/10/15  0530  WBC  24.0*  18.5*  HGB  8.7*  8.3*  HCT  28.6*  27.9*  PLT  287  290    Coag's No results for input(s): APTT, INR in the last 72 hours.  BMET Recent Labs     09/09/15  0530  09/10/15  0530  NA  153*  148*  K  3.9  4.0  CL  113*  105  CO2  22  28  BUN  85*  108*  CREATININE  2.26*  2.55*  GLUCOSE  142*  140*    Electrolytes Recent Labs     09/09/15  0530  09/10/15  0530  CALCIUM  9.0  8.9  MG   --   3.1*  PHOS   --   4.4    Sepsis Markers No results for input(s): PROCALCITON, O2SATVEN in the last 72 hours.  Invalid input(s): LACTICACIDVEN  ABG Recent Labs     09/09/15  2325  09/10/15  0536  PHART  7.212*  7.260*  PCO2ART  59.3*  59.8*  PO2ART  71.7*  84.2    Liver Enzymes Recent Labs     09/09/15  0530  AST  122*  ALT  24  ALKPHOS  134*  BILITOT  1.4*  ALBUMIN  1.7*    Cardiac Enzymes No results for input(s): TROPONINI, PROBNP in the last 72 hours.  Glucose Recent Labs     09/10/15  1159  09/10/15  1616  09/10/15  2145  09/11/15  0032  09/11/15  0412  09/11/15  0747  GLUCAP  128*  118*  127*  144*  150*  163*    Imaging Dg Chest Port 1  View  09/10/2015  CLINICAL DATA:  Respiratory failure. EXAM: PORTABLE CHEST 1 VIEW COMPARISON:  09/09/2015. FINDINGS: Interim removal of right PICC line. Endotracheal tube has been advanced slightly, its tip is 4.6 cm above the carina . Heart size stable. Persistent bilateral pulmonary masses consistent with metastatic disease. Persistent consolidation right lower lobe. Persistent right pleural effusion. No interim change. No pneumothorax. NG tube, left IJ line in stable position . No acute bony abnormality. IMPRESSION: 1. Interim removal of right PICC line. Interim advancement of endotracheal tube, its tip is in good anatomic position 4.6 cm above the carina. Left IJ line and NG tube in stable position. No pneumothorax. 2. Persistent bilateral pulmonary masses consistent with metastatic disease. 3. Persistent consolidation of the right lower lobe suggesting pneumonia. Persistent right pleural effusion. No interim change. Electronically Signed   By: Marcello Moores  Register   On: 09/10/2015 07:10   Dg Chest [facility]  09/09/2015  CLINICAL DATA:  Central catheter placement EXAM: PORTABLE CHEST 1 VIEW COMPARISON:  September 08, 2015 FINDINGS: Endotracheal tube tip is 8.0 cm above the carina. Right subclavian catheter tip is in the superior cava. The new left jugular catheter tip is in the superior vena cava. Nasogastric tube tip and side port are below the diaphragm. No pneumothorax. Multiple pulmonary mass lesions consistent with metastatic disease remain. Widespread interstitial prominence is present bilaterally. Extensive consolidation throughout the right mid lower lung zones remains. Heart size and pulmonary vascularity are normal. No bone lesions. There is evidence of adenopathy, unchanged. IMPRESSION: Left jugular catheter tip in superior vena cava. Other tubes and catheters as described without pneumothorax. Note that the endotracheal tube tip is 8 cm above the carina. Number with Extensive airspace consolidation  on the right remains stable. Pulmonary metastases remain stable as does adenopathy. No change in cardiac silhouette. There is widespread interstitial prominence which potentially may represent lymphangitic spread of tumor. Electronically Signed   By: Lowella Grip III M.D.   On: 09/09/2015 14:21    CULTURES: 1/13 Blood >> neg 1/14 Pneumococcal Ag >> negative 1/14 Legionella Ag >> negative 1/15 C diff PCR >> negative 1/22 BAL>>>neg  BCX2 1/21>>>  ANTIBIOTICS: 1/13 Rocephin >> 1/13 1/13 Zithromax >> 1/13 1/14 Vancomycin >> 1/19, restart 1/21>>> 1/14 Zosyn >> 1/20. 1/21 Ceftaz >>  LINES/TUBES: 1/15 Rt PICC >> 1/24 Left IJ CVL 1/24>>>  STUDIES: 1/13 CT chest >> multiple b/l nodules, mass like consolidation Rt mid lung, Rt hilar and subcarinal mass, Lt hilar LAN, 2.7 cm Rt paratracheal LAN 1/14 CT abd/pelvis >> 10 cm mass superior to uterus 1/15 Tumor markers >> CA 19-9 453, CEA 3.9, CA 125 780.8 1/16 Bronchoscopy >> crush artifact ?small cell lung cancer (non diagnostic)  EVENTS: 1/13 Admit 1/15 Gyn oncology consulted 1/16 Bronchoscopy in ICU; Placed on BiPAP for wheezing, dyspnea 1/19 IR biopsy>>>Poorly differentiated high Grade Carcinoma.  >stains favor Neuroendocrine. TTF stain was NEGATIVE 1/22 bronch cytology:   DISCUSSION: 40 yo female with progressive dyspnea, wheezing.  She was found to have abnormal CT chest/abdomen/pelvis from metastatic cancer. 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 Long d/w pt's family: re: AKI, uremia, worsening MS, worsening aeration. We were very clear that this is very much a heroic attempt and the likelihood that outcome will be poor. We will cont supportive care, limit nephro-toxins, start chemo. Have made her limited code again. Prognosis is very poor. 1/26 question of hemodialysis raised per hemonc. Doubt this would solve anthing.  ASSESSMENT/PLAN:  PULMONARY A: Acute hypoxic respiratory  failure 2nd to post-obstructive pneumonia, metastatic cancer, and wheeze from extrinsic airway compression. Probably element of PE O2 requirements and CXR looking worse.   P: - cont full vent support - continue xopenex, pulmicort, add hypertonic saline nebs - PAD protocol  - wean FIO2 and PEEP as able   CARDIAC A: Persistent  sinus tachycardia. P: - Monitor hemodynamics.  RENAL Lab Results  Component Value Date   CREATININE 2.55* 09/10/2015   CREATININE 2.26* 09/09/2015   CREATININE 1.36* 09/08/2015    A: AKI Hypernatremia-->improved Hyperchloremia -->resolved.  P: Cont D5W & Free water replacement Renal dose meds DC vanc Not a candidate for HD F/u chemistry in am (pending)  GASTROENTEROLOGY A: Nutrition. 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 DVT: left peroneal vein and Right UE extending into the Subclavian -->picc removed.  P: Emergent chemotherapy  Heparin gtt for PE/DVT Further recs per heme/onc. 1/26 chemo on hold  INFECTION A: Sepsis 2nd to post-obstructive pneumonia. Fevers and elevated LA are likely secondary to tumor P: - Follow cultures and procalcitonin. - Restarted on broad spectrum antibiotics 1/21; will stop Vanc given no MRSA  ENDOCRINE A: No acute issues. P: - monitor blood glucose on BMET  NEUROLOGY A: Cancer pain. Acute Encephalopathy. Suspect that this is r/t her AKI and uremia  P: - fentanyl for pain    Husband updated at bedside 1/26, worsening pulmonary functions explained. Discussed case with Hemonc Dr. Benay Spice, chemistry pending. Chemo on hold until renal function available  Richardson Landry Minor ACNP Maryanna Shape PCCM Pager [phone] till 3 pm If no answer page [phone] 09/11/2015, 8:24 AM

## 2015-09-11 NOTE — Progress Notes (Signed)
   09/11/15 1100  Clinical Encounter Type  Visited With Family  Visit Type Initial;Psychological support;Spiritual support;Critical Care  Referral From Nurse  Consult/Referral To Chaplain  Spiritual Encounters  Spiritual Needs Emotional;Other (Comment) (Pastoral Support/Conversation)  Stress Factors  Patient Stress Factors None identified  Family Stress Factors Exhausted;Health changes   I visited with the patient and her family to follow up with them from previous visits. Chaplain Jerene Pitch and our counseling intern [place] have also been working with the family to give support.  The husband, a cousin, mother-in-law, father and brother were in the room upon my arrival.  Husband states that he understands that the patient's kidneys are not doing good. He states that he is continuing to sleep in the room, but got better sleep the previous night. He is feeling better about the care that the patient is receiving. The patient's husband states that the patient's son continues to ask about his mom, but doesn't ask questions about her medical condition. Her son has gone back to school. Follow-up is needed.   [place] M.Div.

## 2015-09-11 NOTE — Progress Notes (Signed)
Pt had 11 beat run of Ventricular tachycardia this morning at 0957 then returned to sinus tachycardia. MD was notified of occurrence, EKG strip was printed and placed in paper chart.

## 2015-09-11 NOTE — Progress Notes (Signed)
IP PROGRESS NOTE  Subjective:   She remains on the ventilator. Her husband and multiple family members are at the bedside.  Objective: Vital signs in last 24 hours: Blood pressure 115/70, pulse 122, temperature 99.5 F (37.5 C), temperature source Axillary, resp. rate 16, height 5' 5.5" (1.664 m), weight 215 lb 13.3 oz (97.9 kg), last menstrual period 09/15/2015, SpO2 92 %.  Intake/Output from previous day: 01/25 0701 - 01/26 0700 In: 7785 [I.V.:4525; NG/GT:3110; IV Piggyback:150] Out: 640 [Urine:440; Stool:200]  Physical Exam:  HEENT: ET tube in place Lungs: Loud upper airway sounds bilaterally. Cardiac: Tachycardia Abdomen: No hepatomegaly, mildly distended Extremities: Edema at the right arm Neurologic: Unresponsive, pupils are equal and reactive  Left IJ catheter site without erythema  Lab Results:  Recent Labs  09/09/15 0530 09/10/15 0530  WBC 24.0* 18.5*  HGB 8.7* 8.3*  HCT 28.6* 27.9*  PLT 287 290    BMET  Recent Labs  09/09/15 0530 09/10/15 0530  NA 153* 148*  K 3.9 4.0  CL 113* 105  CO2 22 28  GLUCOSE 142* 140*  BUN 85* 108*  CREATININE 2.26* 2.55*  CALCIUM 9.0 8.9    Studies/Results: Dg Chest Port 1 View  09/10/2015  CLINICAL DATA:  Respiratory failure. EXAM: PORTABLE CHEST 1 VIEW COMPARISON:  09/09/2015. FINDINGS: Interim removal of right PICC line. Endotracheal tube has been advanced slightly, its tip is 4.6 cm above the carina . Heart size stable. Persistent bilateral pulmonary masses consistent with metastatic disease. Persistent consolidation right lower lobe. Persistent right pleural effusion. No interim change. No pneumothorax. NG tube, left IJ line in stable position . No acute bony abnormality. IMPRESSION: 1. Interim removal of right PICC line. Interim advancement of endotracheal tube, its tip is in good anatomic position 4.6 cm above the carina. Left IJ line and NG tube in stable position. No pneumothorax. 2. Persistent bilateral pulmonary  masses consistent with metastatic disease. 3. Persistent consolidation of the right lower lobe suggesting pneumonia. Persistent right pleural effusion. No interim change. Electronically Signed   By: Marcello Moores  Register   On: 09/10/2015 07:10   Dg Chest Port 1 View  09/09/2015  CLINICAL DATA:  Central catheter placement EXAM: PORTABLE CHEST 1 VIEW COMPARISON:  September 08, 2015 FINDINGS: Endotracheal tube tip is 8.0 cm above the carina. Right subclavian catheter tip is in the superior cava. The new left jugular catheter tip is in the superior vena cava. Nasogastric tube tip and side port are below the diaphragm. No pneumothorax. Multiple pulmonary mass lesions consistent with metastatic disease remain. Widespread interstitial prominence is present bilaterally. Extensive consolidation throughout the right mid lower lung zones remains. Heart size and pulmonary vascularity are normal. No bone lesions. There is evidence of adenopathy, unchanged. IMPRESSION: Left jugular catheter tip in superior vena cava. Other tubes and catheters as described without pneumothorax. Note that the endotracheal tube tip is 8 cm above the carina. Number with Extensive airspace consolidation on the right remains stable. Pulmonary metastases remain stable as does adenopathy. No change in cardiac silhouette. There is widespread interstitial prominence which potentially may represent lymphangitic spread of tumor. Electronically Signed   By: Lowella Grip III M.D.   On: 09/09/2015 14:21    Medications: I have reviewed the patient's current medications.  Assessment/Plan:  1. Metastatic carcinoma with a dominant right lung mass, bilateral lung masses, chest/abdominal lymphadenopathy, and a pelvic mass  Bronchoscopy 09/01/2015 revealed endobronchial lesions at the right upper lobe and right middle lobe, biopsies revealed malignant cells without  a definitive diagnosis  CT-guided biopsy of a respiratory lymph node 09/04/2015 confirmed  poorly differential carcinoma, with some features of a neuroendocrine carcinoma.  Cycle 1 etoposide/carboplatin 09/10/2015  2. Respiratory failure secondary to #1  3. Renal failure  4. Left leg and right upper extremity DVTs 09/09/2015  5.  Altered mental status   She completed day 1 chemotherapy 09/10/2015 without apparent acute toxicity. She remains critically ill with respiratory failure, renal failure, and she is unresponsive. I discussed the situation with her family at the bedside. They understand it is unlikely she will survive the current presentation.  I am concerned that the urinary output has diminished and she remains unresponsive. We will follow-up on the chemistry panel from this morning and decide on the indication for further chemotherapy. I discussed the case with the critical care service.  Recommendations: 1. Continue management of respiratory failure per critical care medicine 2. Consider neurology and nephrology consultation 3. Decision on day 2 etoposide to be made after results of chemistry panel this morning.   LOS: 12 days   Tawnia Schirm  09/11/2015, 8:44 AM

## 2015-09-12 ENCOUNTER — Encounter: Payer: Self-pay | Admitting: Radiation Oncology

## 2015-09-12 ENCOUNTER — Inpatient Hospital Stay (HOSPITAL_COMMUNITY): Payer: BC Managed Care – PPO

## 2015-09-12 ENCOUNTER — Ambulatory Visit
Admit: 2015-09-12 | Discharge: 2015-09-12 | Disposition: A | Discharge status: Home / Self Care | Payer: BC Managed Care – PPO | Attending: Radiation Oncology | Admitting: Radiation Oncology

## 2015-09-12 ENCOUNTER — Ambulatory Visit: Payer: BC Managed Care – PPO | Attending: Radiation Oncology | Admitting: Radiation Oncology

## 2015-09-12 DIAGNOSIS — N179 Acute kidney failure, unspecified: Secondary | ICD-10-CM

## 2015-09-12 DIAGNOSIS — R4020 Unspecified coma: Secondary | ICD-10-CM

## 2015-09-12 DIAGNOSIS — D630 Anemia in neoplastic disease: Secondary | ICD-10-CM

## 2015-09-12 DIAGNOSIS — R4182 Altered mental status, unspecified: Secondary | ICD-10-CM | POA: Insufficient documentation

## 2015-09-12 LAB — RENAL FUNCTION PANEL
ALBUMIN: 1.9 g/dL — AB (ref 3.5–5.0)
ANION GAP: 19 — AB (ref 5–15)
BUN: 167 mg/dL — AB (ref 6–20)
CHLORIDE: 88 mmol/L — AB (ref 101–111)
CO2: 32 mmol/L (ref 22–32)
Calcium: 8.2 mg/dL — ABNORMAL LOW (ref 8.9–10.3)
Creatinine, Ser: 3.86 mg/dL — ABNORMAL HIGH (ref 0.44–1.00)
GFR calc Af Amer: 16 mL/min — ABNORMAL LOW (ref >60)
GFR calc non Af Amer: 14 mL/min — ABNORMAL LOW (ref >60)
GLUCOSE: 145 mg/dL — AB (ref 65–99)
PHOSPHORUS: 9 mg/dL — AB (ref 2.5–4.6)
POTASSIUM: 3.5 mmol/L (ref 3.5–5.1)
Sodium: 139 mmol/L (ref 135–145)

## 2015-09-12 LAB — CBC
HCT: 23.9 % — ABNORMAL LOW (ref 36.0–46.0)
HEMOGLOBIN: 7.4 g/dL — AB (ref 12.0–15.0)
MCH: 27.6 pg (ref 26.0–34.0)
MCHC: 31 g/dL (ref 30.0–36.0)
MCV: 89.2 fL (ref 78.0–100.0)
Platelets: 283 10*3/uL (ref 150–400)
RBC: 2.68 MIL/uL — ABNORMAL LOW (ref 3.87–5.11)
RDW: 15 % (ref 11.5–15.5)
WBC: 20.9 10*3/uL — AB (ref 4.0–10.5)

## 2015-09-12 LAB — BASIC METABOLIC PANEL
ANION GAP: 23 — AB (ref 5–15)
BUN: 135 mg/dL — ABNORMAL HIGH (ref 6–20)
CALCIUM: 8.4 mg/dL — AB (ref 8.9–10.3)
CHLORIDE: 87 mmol/L — AB (ref 101–111)
CO2: 32 mmol/L (ref 22–32)
CREATININE: 3.6 mg/dL — AB (ref 0.44–1.00)
GFR calc non Af Amer: 15 mL/min — ABNORMAL LOW (ref >60)
GFR, EST AFRICAN AMERICAN: 17 mL/min — AB (ref >60)
Glucose, Bld: 139 mg/dL — ABNORMAL HIGH (ref 65–99)
Potassium: 3.8 mmol/L (ref 3.5–5.1)
SODIUM: 142 mmol/L (ref 135–145)

## 2015-09-12 LAB — GLUCOSE, CAPILLARY
GLUCOSE-CAPILLARY: 131 mg/dL — AB (ref 65–99)
GLUCOSE-CAPILLARY: 139 mg/dL — AB (ref 65–99)
GLUCOSE-CAPILLARY: 152 mg/dL — AB (ref 65–99)
Glucose-Capillary: 138 mg/dL — ABNORMAL HIGH (ref 65–99)
Glucose-Capillary: 117 mg/dL — ABNORMAL HIGH (ref 65–99)
Glucose-Capillary: 132 mg/dL — ABNORMAL HIGH (ref 65–99)

## 2015-09-12 LAB — HEPARIN LEVEL (UNFRACTIONATED): Heparin Unfractionated: 0.42 IU/mL (ref 0.30–0.70)

## 2015-09-12 LAB — MAGNESIUM: MAGNESIUM: 3 mg/dL — AB (ref 1.7–2.4)

## 2015-09-12 LAB — PHOSPHORUS: PHOSPHORUS: 9.2 mg/dL — AB (ref 2.5–4.6)

## 2015-09-12 MED ORDER — ALTEPLASE 2 MG IJ SOLR: 2.0000 mg | Freq: Once | INTRAMUSCULAR | Status: DC | PRN

## 2015-09-12 MED ORDER — HEPARIN SODIUM (PORCINE) 1000 UNIT/ML DIALYSIS: 1000.0000 [IU] | INTRAMUSCULAR | Status: DC | PRN

## 2015-09-12 MED ORDER — PRISMASOL BGK 4/2.5 32-4-2.5 MEQ/L IV SOLN
INTRAVENOUS | Status: DC
  Administered 2015-09-12 – 2015-09-22 (×10): via INTRAVENOUS_CENTRAL
  Filled 2015-09-12 (×16): qty 5000

## 2015-09-12 MED ORDER — DEXTROSE 5 % IV SOLN
2.0000 g | Freq: Two times a day (BID) | INTRAVENOUS | Status: DC
  Administered 2015-09-12 – 2015-09-15 (×7): 2 g via INTRAVENOUS
  Filled 2015-09-12 (×9): qty 2

## 2015-09-12 MED ORDER — HEPARIN SODIUM (PORCINE) 1000 UNIT/ML IJ SOLN
1000.0000 [IU] | INTRAMUSCULAR | Status: DC | PRN
  Administered 2015-09-12 – 2015-09-19 (×4): 1600 [IU]
  Administered 2015-09-22: 3200 [IU]
  Administered 2015-09-23 (×2): 1600 [IU]
  Administered 2015-09-26: 3200 [IU]
  Filled 2015-09-12: qty 4
  Filled 2015-09-12: qty 6
  Filled 2015-09-12: qty 4
  Filled 2015-09-12: qty 6

## 2015-09-12 MED ORDER — SODIUM CHLORIDE 0.9 % FOR CRRT
INTRAVENOUS_CENTRAL | Status: DC | PRN
  Administered 2015-09-18: 20:00:00 via INTRAVENOUS_CENTRAL
  Filled 2015-09-12 (×2): qty 1000

## 2015-09-12 MED ORDER — PRISMASOL BGK 4/2.5 32-4-2.5 MEQ/L IV SOLN
INTRAVENOUS | Status: DC
  Administered 2015-09-12 – 2015-09-23 (×94): via INTRAVENOUS_CENTRAL
  Filled 2015-09-12 (×110): qty 5000

## 2015-09-12 MED ORDER — PRISMASOL BGK 4/2.5 32-4-2.5 MEQ/L IV SOLN
INTRAVENOUS | Status: DC
  Administered 2015-09-12 – 2015-09-23 (×21): via INTRAVENOUS_CENTRAL
  Filled 2015-09-12 (×25): qty 5000

## 2015-09-12 MED ORDER — DEXTROSE 5 % IV SOLN: 1.0000 g | INTRAVENOUS | Status: DC

## 2015-09-12 NOTE — Procedures (Signed)
Central Venous dialysis Catheter Insertion Procedure Note Monique [name] [id number] 03/07/1976  Procedure: Insertion of Central Venous Catheter Indications: dialysis   Procedure Details Consent: Risks of procedure as well as the alternatives and risks of each were explained to the (patient/caregiver).  Consent for procedure obtained. Time Out: Verified patient identification, verified procedure, site/side was marked, verified correct patient position, special equipment/implants available, medications/allergies/relevent history reviewed, required imaging and test results available.  Performed Real time [place] used to ID and cannulate the Vessel .   Maximum sterile technique was used including antiseptics, cap, gloves, gown, hand hygiene, mask and sheet. Skin prep: Chlorhexidine; local anesthetic administered A antimicrobial bonded/coated triple lumen catheter was placed in the left femoral vein due to patient being a dialysis patient using the Seldinger technique.  Evaluation Blood flow good Complications: No apparent complications Patient did tolerate procedure well. Chest X-ray ordered to verify placement.  CXR: normal.  Clementeen Graham 09/12/2015, 3:47 PM

## 2015-09-12 NOTE — Progress Notes (Signed)
ANTICOAGULATION CONSULT NOTE - Follow up  Pharmacy Consult for heparin IV  Indication: DVT noted in the subclavian vein and axillary vein.  No Known Allergies  Patient Measurements: Height: 5' 5.5" (166.4 cm) Weight: 217 lb 2.5 oz (98.5 kg) IBW/kg (Calculated) : 58.15 Heparin Dosing Weight: 76.8 kg  Vital Signs: Temp: 98.5 F (36.9 C) (01/27 0400) Temp Source: Axillary (01/27 0400) BP: 113/64 mmHg (01/27 0500)  Labs:  Recent Labs  09/10/15 0530 09/10/15 0945 09/11/15 0410 09/11/15 0420 09/12/15 0335  HGB 8.3*  --   --   --  7.4*  HCT 27.9*  --   --   --  23.9*  PLT 290  --   --   --  283  HEPARINUNFRC  --  0.45 0.49  --  0.42  CREATININE 2.55*  --   --  2.94* 3.60*    Estimated Creatinine Clearance: 24.4 mL/min (by C-G formula based on Cr of 3.6).   Medical History: History reviewed. No pertinent past medical history.  Medications:  Scheduled:  . antiseptic oral rinse  7 mL Mouth Rinse BID  . antiseptic oral rinse  7 mL Mouth Rinse QID  . budesonide (PULMICORT) nebulizer solution  0.5 mg Nebulization BID  . cefTAZidime (FORTAZ)  IV  1 g Intravenous Q12H  . chlorhexidine gluconate  15 mL Mouth Rinse BID  . levalbuterol  0.63 mg Nebulization Q6H  . pantoprazole sodium  40 mg Per Tube Daily  . sodium chloride  10-40 mL Intracatheter Q12H  . sodium chloride  3 mL Intravenous Q12H    Assessment: Pt is a 40 yo female diagnosed with DVT noted in the subclavian vein and axillary vein. Pt is currently intubated with acute hypoxic respiratory failure secondary to post-obstructive pneumonia, metastatic cancer, and wheeze from extrinsic airway compression. Hgb has been trending down recently, will monitor closely.   Today, 09/12/2015:  Heparin level remains in the target range on 1300units/hr.  H/H and platelets are stable. No bleeding reported/documented.  SCr rising.  Goal of Therapy:  Heparin level 0.3-0.7 units/ml Monitor platelets by anticoagulation  protocol: Yes   Plan:  Cont heparin infusion at 1300 units/hr. Heparin level daily. Check CBC q24h while on heparin. F/u daily.  Dolly Rias RPh 09/12/2015, 7:29 AM Pager [phone]

## 2015-09-12 NOTE — Progress Notes (Signed)
PCCM PROGRESS NOTE  ADMISSION DATE: 08/23/2015 CONSULT DATE: 08/30/2015 REFERRING PROVIDER: Dr. Roel Cluck, [facility]  CC: Short of breath  SUBJECTIVE:  Looks worse .  Agonal breathing.  VITAL SIGNS: BP 111/66 mmHg  Pulse 117  Temp(Src) 98.5 F (36.9 C) (Axillary)  Resp 19  Ht 5' 5.5" (1.664 m)  Wt 217 lb 2.5 oz (98.5 kg)  BMI 35.57 kg/m2  SpO2 92%  LMP 08/31/2015  INTAKE/OUTPUT: I/O last 3 completed shifts: In: 8446.3 [I.V.:4996.3; NG/GT:3300; IV Piggyback:150] Out: 1080 [Urine:380; Stool:700]  General: No sedation x 3 days, on vent.  NO response to sternal rub HEENT: ETT, No JVD Cardiac: regular, No MRG Chest: B/L rhonchi, decreased in bases Abd: Distended. + BS Ext: 1+ edema Neuro: no response to sternal rub Skin: no rashes  CBC Recent Labs     09/10/15  0530  09/12/15  0335  WBC  18.5*  20.9*  HGB  8.3*  7.4*  HCT  27.9*  23.9*  PLT  290  283    Coag's No results for input(s): APTT, INR in the last 72 hours.  BMET Recent Labs     09/10/15  0530  09/11/15  0420  09/12/15  0335  NA  148*  143  142  K  4.0  4.8  3.8  CL  105  95*  87*  CO2  28  29  32  BUN  108*  111*  135*  CREATININE  2.55*  2.94*  3.60*  GLUCOSE  140*  156*  139*    Electrolytes Recent Labs     09/10/15  0530  09/11/15  0420  09/12/15  0335  CALCIUM  8.9  8.8*  8.4*  MG  3.1*   --   3.0*  PHOS  4.4   --   9.2*    Sepsis Markers No results for input(s): PROCALCITON, O2SATVEN in the last 72 hours.  Invalid input(s): LACTICACIDVEN  ABG Recent Labs     09/09/15  2325  09/10/15  0536  PHART  7.212*  7.260*  PCO2ART  59.3*  59.8*  PO2ART  71.7*  84.2    Liver Enzymes Recent Labs     09/11/15  0420  AST  224*  ALT  46  ALKPHOS  228*  BILITOT  1.1  ALBUMIN  2.0*    Cardiac Enzymes No results for input(s): TROPONINI, PROBNP in the last 72 hours.  Glucose Recent Labs     09/11/15  1135  09/11/15  1616  09/11/15  1954  09/11/15  2342  09/12/15  0354   09/12/15  0811  GLUCAP  139*  142*  124*  139*  131*  138*    Imaging Dg Chest Port 1 View  09/12/2015  CLINICAL DATA:  Respiratory failure, postobstructive new pneumonia due to lung mass, sepsis, intubation. EXAM: PORTABLE CHEST 1 VIEW COMPARISON:  Chest x-ray of September 10, 2015 FINDINGS: The lungs are well-expanded. Confluent alveolar opacities persist bilaterally and are slightly more conspicuous especially on the left. Confluent density in the right mid and lower lung is also more conspicuous. A small right pleural effusion remains. The cardiac silhouette is top-normal in size. The endotracheal tube tip lies 4 cm above the carina. The esophagogastric tube tip projects below the inferior margin of the image. The left internal jugular venous catheter tip projects over the midportion of the SVC. IMPRESSION: Slight interval deterioration in the appearance of both lungs suggesting progressive pneumonia. The support tubes are in reasonable  position. Electronically Signed   By: David  Martinique M.D.   On: 09/12/2015 07:13  worsening aeration   CULTURES: 1/13 Blood >> neg 1/14 Pneumococcal Ag >> negative 1/14 Legionella Ag >> negative 1/15 C diff PCR >> negative 1/22 BAL>>>neg  BCX2 1/21>>>  ANTIBIOTICS: 1/13 Rocephin >> 1/13 1/13 Zithromax >> 1/13 1/14 Vancomycin >> 1/19, restart 1/21>>> 1/14 Zosyn >> 1/20. 1/21 Ceftaz >>  LINES/TUBES: 1/15 Rt PICC >> 1/24 Left IJ CVL 1/24>>>  STUDIES: 1/13 CT chest >> multiple b/l nodules, mass like consolidation Rt mid lung, Rt hilar and subcarinal mass, Lt hilar LAN, 2.7 cm Rt paratracheal LAN 1/14 CT abd/pelvis >> 10 cm mass superior to uterus 1/15 Tumor markers >> CA 19-9 453, CEA 3.9, CA 125 780.8 1/16 Bronchoscopy >> crush artifact ?small cell lung cancer (non diagnostic)  EVENTS: 1/13 Admit 1/15 Gyn oncology consulted 1/16 Bronchoscopy in ICU; Placed on BiPAP for wheezing, dyspnea 1/19 IR biopsy>>>Poorly differentiated high Grade Carcinoma.   >stains favor Neuroendocrine. TTF stain was NEGATIVE 1/22 bronch cytology:   DISCUSSION: 40 yo female with progressive dyspnea, wheezing.  She was found to have abnormal CT chest/abdomen/pelvis from metastatic cancer. 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 Long d/w pt's family: re: AKI, uremia, worsening MS, worsening aeration. We were very clear that this is very much a heroic attempt and the likelihood that outcome will be poor. She did get one dose of chemo, but now she continues to decline. Given her rising creatinine she is not a candidate for further chemo at this point.  We will cont supportive care, limit nephro-toxins. The husband is requesting nephrology consult and asking for CRRT by name. We were very clear that there are no guarantees that this will help and the likely scenario here is that she improves enough to suffer from her illness. We are rapidly approaching the level of futility. Have asked for a nephrology consult Do not think that we should offer CRRT, but husband is insistent that we ask. Will continue to discuss this w/ the husband and family.   ASSESSMENT/PLAN:  PULMONARY A: Acute hypoxic respiratory failure 2nd to post-obstructive pneumonia, metastatic cancer, and wheeze from extrinsic airway compression. Probably element of PE O2 requirements and CXR looking worse.   P: - cont full vent support - continue xopenex, pulmicort, add hypertonic saline nebs - PAD protocol  - wean FIO2 and PEEP as able   CARDIAC A: Persistent  sinus tachycardia. P: - Monitor hemodynamics.  RENAL A: AKI w/ worsening Uremia  Volume OverLoad  Hypernatremia-->improved Hyperchloremia -->resolved.  Now massively volume overloaded & oliguric w/ rising scr and BUN. Family asking about CRRT. DO NOT feel that she is a dialysis candidate. Have requested consult per family request.   P: Cont free water Renal dose meds Renal consult  pending F/u daily chemistry  GASTROENTEROLOGY A: Nutrition. 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 DVT: left peroneal vein and Right UE extending into the Subclavian -->picc removed.  P: Heparin gtt for PE/DVT Further recs per heme/onc. 1/26 & 1/27 chemo on hold-->Do NOT think that there is a role for radiation here    INFECTION A: Sepsis 2nd to post-obstructive pneumonia. Fevers and elevated LA are likely secondary to tumor P: - Follow cultures and procalcitonin. - Restarted  on broad spectrum antibiotics 1/21  ENDOCRINE A: No acute issues. P: - monitor blood glucose on BMET  NEUROLOGY A: Cancer pain. Acute Encephalopathy. Suspect that this is r/t her AKI and uremia  P: - fentanyl for pain    Erick Colace ACNP-BC [place] Pager # [phone] OR # [phone] if no answer  09/12/2015, 10:06 AM

## 2015-09-12 NOTE — Progress Notes (Signed)
   09/12/15 1700  Clinical Encounter Type  Visited With Family  Visit Type Follow-up;Social support  Referral From Nurse;Family  Consult/Referral To None  Spiritual Encounters  Spiritual Needs Emotional;Grief support  Stress Factors  Patient Stress Factors Not reviewed  Family Stress Factors Exhausted;Loss of control;Major life changes   Counseling intern provided brief emotional support to pt's family. Will follow up Tuesday 1/31.

## 2015-09-12 NOTE — Progress Notes (Signed)
  Pharmacy Antibiotic Follow-up Note  Monique [name] is a 40 y.o. year-old female admitted on 09/05/2015.  The patient is currently on day 7 of ceftazidime.  Assessment/Plan:  Increase Ceftazidime 2gm IV to q12h while on CVVHD  Temp (24hrs), Avg:99.2 F (37.3 C), Min:98.5 F (36.9 C), Max:100.5 F (38.1 C)   Recent Labs Lab 09/07/15 1040 09/08/15 0400 09/09/15 0530 09/10/15 0530 09/12/15 0335  WBC 20.0* 18.7* 24.0* 18.5* 20.9*     Recent Labs Lab 09/09/15 0530 09/10/15 0530 09/11/15 0420 09/12/15 0335 09/12/15 1730  CREATININE 2.26* 2.55* 2.94* 3.60* 3.86*   Estimated Creatinine Clearance: 22.7 mL/min (by C-G formula based on Cr of 3.86).    No Known Allergies  Antimicrobials this admission: 1/13 >> Rocephin >> 1/13 1/13 >> Zithromax >> 1/13 1/14 >> Vanc >> 1/19, 1/21 >> Vanc >>1/25 1/14 >> Zosyn >> 1/20 1/21 >> Tressie Ellis >>  Levels/dose changes this admission: 1/15 1900 VT: 15 on 1g IV q8h 1/18 1130 VT: 15 on 1g IV q8h 1/24 1500 VT: 36 on '750mg'$  IV q8h; hold Vanc. Decrease ceftazidime to 1 gr IV q12h  1/27:  Fortaz decreased to 1gm IV q24h for CrCl<30;  PM-CVVHD started & dose increased to 2gm IV q12h  Microbiology results: 1/14 HIV screen: neg 1/14 S. pneumo UAg: neg 1/14 Legionella UAg: neg 1/14 MRSA PCR: neg 1/15 Cdiff PCR: negative 1/13 blood x2: NGF 1/21 Trach asp: moderate yeast c/w candida, final (insignificant per CCM) 1/21 blood x2: ngtd 1/22 bronch washings: 5K colonies/ml, Candida, final (insignificant per CCM)  Thank you for allowing pharmacy to be a part of this patient's care.  Netta Cedars, PharmD, BCPS Pager: [phone] 09/12/2015, 6:42 PM

## 2015-09-12 NOTE — Consult Note (Signed)
Renal Service Consult Note [place] 09/12/2015 Monique [name] D Requesting Physician:  Dr Vaughan Browner  Reason for Consult:  Acute kidney injury  HPI: The patient is a 40 y.o. year-old with no PMH who presented with SOB in Dec 2016, rx w abx. Got worse and was admitted here on 08/23/2015 with multiple chest masses on CT and plain film CXR.  RML was socked-in/ collapsed. CT angio of chest and CT abdomen done with contrast. She had a bronch w biopsy on 1/16, no specific diagnosis made but possibly small cell carcinoma.  Intubated on 09/04/15. Had then CT guided bx of L abd lymph node on 1/19 and path showed just poorly differentiated tumor, possible neuroendocrine.  She is f/b oncology and was given chemoRx x 1 on 1/15 with etoposide and carboplatin.    She developed acute renal failure on Jan 20th w rising creatinine from 0.6 > 1.70 and continues. To rise up to 3.60 today with BUN 135. UOP yesterday was 170 cc. I/O's the last 7 days is 35 liters in - 7.5L out = 27.5 liters +. Weights are up 80 kg to 98 kg, +18kg.   Rec'd IV contrast x 2 as below.  No acei/ arb/ nsaids.    Chest CT angio +contrast 1/13 > 100 ml omnipaque Abd CT +contrast 1/14 > 100 ml Omnipaque   Past Medical History History reviewed. No pertinent past medical history. Past Surgical History History reviewed. No pertinent past surgical history. Family History  Family History  Problem Relation Age of Onset  . Lymphoma Mother   . CAD Mother    Social History  reports that she has never smoked. She does not have any smokeless tobacco history on file. She reports that she drinks alcohol. She reports that she does not use illicit drugs. Allergies No Known Allergies Home medications Prior to Admission medications   Medication Sig Start Date End Date Taking? Authorizing Provider  albuterol (PROVENTIL HFA;VENTOLIN HFA) 108 (90 Base) MCG/ACT inhaler Inhale 2 puffs into the lungs every 4 (four) hours as  needed for wheezing or shortness of breath.   Yes Historical Provider, MD  Ascorbic Acid (VITAMIN C PO) Take 1 tablet by mouth daily.   Yes Historical Provider, MD  budesonide-formoterol (SYMBICORT) 160-4.5 MCG/ACT inhaler Inhale 2 puffs into the lungs 2 (two) times daily.   Yes Historical Provider, MD  Cholecalciferol (VITAMIN D PO) Take 1 tablet by mouth daily.   Yes Historical Provider, MD  clarithromycin (BIAXIN) 500 MG tablet Take 500 mg by mouth 2 (two) times daily. For 10 days 08/26/15-09/05/15 08/26/15  Yes Historical Provider, MD  levofloxacin (LEVAQUIN) 500 MG tablet Take 500 mg by mouth daily. Reported on 09/02/2015 08/18/15   Historical Provider, MD   Liver Function Tests  Recent Labs Lab 09/08/15 0400 09/09/15 0530 09/11/15 0420  AST 140* 122* 224*  ALT 24 24 46  ALKPHOS 128* 134* 228*  BILITOT 1.8* 1.4* 1.1  PROT 6.1* 5.8* 6.0*  ALBUMIN 1.8* 1.7* 2.0*    Recent Labs Lab 09/07/15 0430  LIPASE 134*  AMYLASE 99   CBC  Recent Labs Lab 09/09/15 0530 09/10/15 0530 09/12/15 0335  WBC 24.0* 18.5* 20.9*  HGB 8.7* 8.3* 7.4*  HCT 28.6* 27.9* 23.9*  MCV 88.5 90.6 89.2  PLT 287 290 762   Basic Metabolic Panel  Recent Labs Lab 09/06/15 0420 09/07/15 1040 09/08/15 0400 09/09/15 0530 09/10/15 0530 09/11/15 0420 09/12/15 0335  NA 141 148* 154* 153* 148* 143  142  K 4.0 3.9 3.5 3.9 4.0 4.8 3.8  CL 99* 111 113* 113* 105 95* 87*  CO2 '29 27 25 22 28 29 '$ 32  GLUCOSE 144* 150* 128* 142* 140* 156* 139*  BUN 58* 66* 64* 85* 108* 111* 135*  CREATININE 1.70* 1.44* 1.36* 2.26* 2.55* 2.94* 3.60*  CALCIUM 9.1 8.8* 8.3* 9.0 8.9 8.8* 8.4*  PHOS  --   --  3.4  --  4.4  --  9.2*    Filed Vitals:   09/12/15 1100 09/12/15 1151 09/12/15 1200 09/12/15 1300  BP: 113/54 113/84 100/53 104/47  Pulse:      Temp:   98.8 F (37.1 C)   TempSrc:   Oral   Resp: '18 19 19 19  '$ Height:      Weight:      SpO2: 95% 94% 93% 93%   Exam Obtunded WF on vent, unresponsive No rash, cyanosis or  gangrene Sclera anicteric, throat clear No jvd Chest coarse BS bilat RRR no mrg Abd soft mod distended no mass GU foley in place, dark amber urine MS no joint effusion Ext diffuse 2-3+ ext edema x 4 Neuro not responding to commands   Na 142  K 3.8  BUN 135  Creat 3.60  CO2 32 WBC 20k  Hb 7.4  plt 283k CXR RML consolidation, scattered nodular changes UA 1/21 > protein neg, 0-5 rbc/wbc's   Assessment: 1 Acute renal failure - suspect ATN, oliguric.  Prob related to febrile/ septic picture. UA not suggestive of any GN. AIN less likely.  Have d/w family, plan is for CRRT for now.   2 Vol excess up 18kg 3 Anemia Hb 7.4 4 Metastatic cancer, uncertain type - suspected small cell malignancy, SP chemo x 1 on 1/25   Plan - see CRRT orders.  No heparin for now. Get vol down as tolerated.   Kelly Splinter MD [employer] pager [phone]    cell [phone] 09/12/2015, 1:21 PM

## 2015-09-12 NOTE — Progress Notes (Signed)
   09/12/15 1400  Clinical Encounter Type  Visited With Family  Visit Type Follow-up;Psychological support;Spiritual support;Critical Care  Referral From Family  Consult/Referral To Chaplain  Spiritual Encounters  Spiritual Needs Emotional;Other (Comment) (Pastoral Conversation/Support)  Stress Factors  Patient Stress Factors Not reviewed  Family Stress Factors Exhausted;Health changes;Other (Comment) (Fear of Loss)   I followed up with the family per previous visits. Spiritual Care and Counseling intern have been trying to make regular checks on the family for support and emotional care.  Upon my arrival the family had just decided to do dialysis on the patient. The husband seems to understand the severity of the patient's condition, but wants to try everything possible to save her.  The husband wants the patient to wake up a little more and be able to communicate.  The patient's son was in the room playing video games and spoke with me.  The family stated that they were exhausted and were not getting any rest.  I consulted with the nurse and let her know that [place] is here if needed and to please page [place] if they need anything.  We will follow-up with the patient and her family regularly.   [place] M.Div.

## 2015-09-12 NOTE — Progress Notes (Signed)
Nutrition Follow-up  DOCUMENTATION CODES:   Not applicable  INTERVENTION:  -Continue Nepro @ 50 mL/hr which will provide 2160 kcal (102% estimated needs), 97.2 grams of protein, and 872 mL free water.  200 mL free water every 6 hours to provide 800 mL free water - RD will continue to monitor for needs   NUTRITION DIAGNOSIS:   Inadequate oral intake related to inability to eat as evidenced by NPO status.  ongoing  GOAL:   Patient will meet greater than or equal to 90% of their needs  meeting  MONITOR:   TF tolerance, Skin, Vent status, Labs, I & O's  REASON FOR ASSESSMENT:   Consult Enteral/tube feeding initiation and management  ASSESSMENT:   Presented with about the month worsens worsening shortness of breath and wheezing. She has been seen for this by her primary care provider diagnosed with pneumonia based on chest x-ray and completed antibiotics is not improvement repeat chest x-ray showed persistent pneumonia she had a total three courses of antibiotics Including z-pack, levaquin and clarithromycin. Suspect that she has a malignancy of sort, however, it is unclear what the primary is;  Multi-compartmental masses (chest, adenopathy, ovarian).   Patient is currently intubated on ventilator support MV: 10.5 L/min Temp (24hrs), Avg:98.8 F (37.1 C), Min:98 F (36.7 C), Max:99.6 F (37.6 C)  Propofol: none  Pt is tolerating NePro fine. No complaints there.  Per NP note, pt is declining rapidly. They began chemotherapy, but patient's kidney function declined rapidly and thus, the d/c chemo. Pt has currently gained 40# during admission, likely fluid overloaded. We also have been giving her fluids to help control her hypernatremia.  Recently, husband requested CRRT, NP is reluctant to move forward as pt is rapidly declining, has poor prognosis, CA w/ Mets, but Husband was insistent. Nephrology has been consulted.  Continue to follow for goals of care, changes in  needs if CRRT is started.  Labs: CBGS 124-138, Phos 9.2, Mg 3.0, Cr 3.60, Ca 8.4, Medications: Apresoline, Versed   Diet Order:  Diet NPO time specified  Skin:  Reviewed, no issues  Last BM:  1/22  Height:   Ht Readings from Last 1 Encounters:  08/30/15 5' 5.5" (1.664 m)    Weight:   Wt Readings from Last 1 Encounters:  09/12/15 217 lb 2.5 oz (98.5 kg)    Ideal Body Weight:  57.95 kg (kg)  BMI:  Body mass index is 35.57 kg/(m^2).  Estimated Nutritional Needs:   Kcal:  2100  Protein:  99-123 grams  Fluid:  >/= 2 L/day  EDUCATION NEEDS:   No education needs identified at this time  Monique [name]. Monique Eliot, MS, RD LDN After Hours/Weekend Pager [phone]

## 2015-09-12 NOTE — Progress Notes (Signed)
   09/12/15 1000  Clinical Encounter Type  Visited With Patient and family together  Visit Type Follow-up;Social support  Referral From Family;Nurse  Consult/Referral To None  Spiritual Encounters  Spiritual Needs Emotional;Grief support  Stress Factors  Patient Stress Factors Not reviewed  Family Stress Factors Exhausted;Family relationships;Loss of control;Major life changes;Loss    Counseling intern provided emotional and grief support to pt's family (pt's brother, father, mother-in-law, and husband). Family identified stressors such as exhaustion and feelings of sadness. Family reported that they are torn on dialysis treatment and are unsure of how to best be supportive. Counselor processed these feelings and concerns as well as discussed what would be best for pt's son with family. Counseling intern will follow up with family in the afternoon.   Duffy Rhody Counseling Intern

## 2015-09-12 NOTE — Progress Notes (Signed)
Pt's sister-in-law and brother-in-law were bedside when I arrived. They were appreciative of [place] visit and wanted prayer but wants to wait until pt's husband returns. CH will ck back w/family after rounding. Pls page if pt's husband returns prior to that time. [place], M.Div.   09/12/15 2000  Clinical Encounter Type  Visited With Family

## 2015-09-12 NOTE — Progress Notes (Signed)
  Pharmacy Antibiotic Follow-up Note  Monique [name] is a 40 y.o. year-old female admitted on 08/24/2015.  The patient is currently on day 7 of ceftazidime.  Assessment/Plan:  Adjust Ceftazidime 1gm IV to q24h for CrCl < 72ms/min   Temp (24hrs), Avg:98.8 F (37.1 C), Min:98 F (36.7 C), Max:99.6 F (37.6 C)   Recent Labs Lab 09/07/15 1040 09/08/15 0400 09/09/15 0530 09/10/15 0530 09/12/15 0335  WBC 20.0* 18.7* 24.0* 18.5* 20.9*     Recent Labs Lab 09/08/15 0400 09/09/15 0530 09/10/15 0530 09/11/15 0420 09/12/15 0335  CREATININE 1.36* 2.26* 2.55* 2.94* 3.60*   Estimated Creatinine Clearance: 24.4 mL/min (by C-G formula based on Cr of 3.6).    No Known Allergies  Antimicrobials this admission: 1/13 >> Rocephin >> 1/13 1/13 >> Zithromax >> 1/13 1/14 >> Vanc >> 1/19, 1/21 >> Vanc >>1/25 1/14 >> Zosyn >> 1/20 1/21 >> FTressie Ellis>>  Levels/dose changes this admission: 1/15 1900 VT: 15 on 1g IV q8h 1/18 1130 VT: 15 on 1g IV q8h 1/24 1500 VT: 36 on '750mg'$  IV q8h; hold Vanc. Decrease ceftazidime to 1 gr IV q12h   Microbiology results: 1/14 HIV screen: neg 1/14 S. pneumo UAg: neg 1/14 Legionella UAg: neg 1/14 MRSA PCR: neg 1/15 Cdiff PCR: negative 1/13 blood x2: NGF 1/21 Trach asp: moderate yeast c/w candida, final (insignificant per CCM) 1/21 blood x2: ngtd 1/22 bronch washings: 5K colonies/ml, Candida, final (insignificant per CCM)  Thank you for allowing pharmacy to be a part of this patient's care.  EDolly RiasRPh 09/12/2015, 7:36 AM Pager [phone]

## 2015-09-12 NOTE — Progress Notes (Signed)
IP PROGRESS NOTE  Subjective:   She remains on the ventilator. Her husband and other family members are at the bedside. We decided to discontinue chemotherapy after day 1 secondary to progressive renal failure.  Objective: Vital signs in last 24 hours: Blood pressure 111/66, pulse 117, temperature 98.5 F (36.9 C), temperature source Axillary, resp. rate 19, height 5' 5.5" (1.664 m), weight 217 lb 2.5 oz (98.5 kg), last menstrual period 09/09/2015, SpO2 92 %.  Intake/Output from previous day: 01/26 0701 - 01/27 0700 In: 4430.3 [I.V.:2740.3; NG/GT:1590; IV Piggyback:100] Out: 770 [Urine:270; Stool:500]  Physical Exam:  HEENT: ET tube in place Lungs: Loud upper airway sounds bilaterally. Cardiac: Tachycardia Abdomen: No hepatomegaly, mildly distended Extremities: Edema at the right arm Neurologic: Unresponsive, pupils are equal and reactive  Left IJ catheter site without erythema  Lab Results:  Recent Labs  09/10/15 0530 09/12/15 0335  WBC 18.5* 20.9*  HGB 8.3* 7.4*  HCT 27.9* 23.9*  PLT 290 283    BMET  Recent Labs  09/11/15 0420 09/12/15 0335  NA 143 142  K 4.8 3.8  CL 95* 87*  CO2 29 32  GLUCOSE 156* 139*  BUN 111* 135*  CREATININE 2.94* 3.60*  CALCIUM 8.8* 8.4*    Studies/Results: Dg Chest Port 1 View  09/12/2015  CLINICAL DATA:  Respiratory failure, postobstructive new pneumonia due to lung mass, sepsis, intubation. EXAM: PORTABLE CHEST 1 VIEW COMPARISON:  Chest x-ray of September 10, 2015 FINDINGS: The lungs are well-expanded. Confluent alveolar opacities persist bilaterally and are slightly more conspicuous especially on the left. Confluent density in the right mid and lower lung is also more conspicuous. A small right pleural effusion remains. The cardiac silhouette is top-normal in size. The endotracheal tube tip lies 4 cm above the carina. The esophagogastric tube tip projects below the inferior margin of the image. The left internal jugular venous  catheter tip projects over the midportion of the SVC. IMPRESSION: Slight interval deterioration in the appearance of both lungs suggesting progressive pneumonia. The support tubes are in reasonable position. Electronically Signed   By: David  Martinique M.D.   On: 09/12/2015 07:13    Medications: I have reviewed the patient's current medications.  Assessment/Plan:  1. Metastatic carcinoma with a dominant right lung mass, bilateral lung masses, chest/abdominal lymphadenopathy, and a pelvic mass  Bronchoscopy 09/01/2015 revealed endobronchial lesions at the right upper lobe and right middle lobe, biopsies revealed malignant cells without a definitive diagnosis  CT-guided biopsy of a respiratory lymph node 09/04/2015 confirmed poorly differential carcinoma, with some features of a neuroendocrine carcinoma.  Cycle 1 etoposide/carboplatin 09/10/2015  2. Respiratory failure secondary to #1 and pneumonia/probable ARDS  3. Renal failure-progressive  4. Left leg and right upper extremity DVTs 09/09/2015  5.  Altered mental status  6.  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 Her prognosis for recovery is poor. I discussed the case with Dr. Vaughan Browner. He agrees the prognosis is poor. We decided to consult nephrology and radiation oncology. I discussed the case with Dr. Tammi Klippel and he will see her today.  She remains unresponsive, likely secondary to renal failure, respiratory failure, and delirium. However she could have CNS metastases.  I began a discussion regarding palliative care with her husband and other family members.  Recommendations: 1. Continue management of respiratory failure per critical care medicine 2. Consider neurology and nephrology consultation 3. Radiation oncology consult-Dr. Tammi Klippel  contacted 4. Oncology will continue rounding on her daily, please  call as needed.   LOS: 13 days   Avan Gullett  09/12/2015, 10:21 AM

## 2015-09-13 ENCOUNTER — Inpatient Hospital Stay (HOSPITAL_COMMUNITY)
Admit: 2015-09-13 | Discharge: 2015-09-13 | Disposition: A | Discharge status: Home / Self Care | Payer: BC Managed Care – PPO | Attending: Neurology | Admitting: Neurology

## 2015-09-13 ENCOUNTER — Inpatient Hospital Stay (HOSPITAL_COMMUNITY): Payer: BC Managed Care – PPO

## 2015-09-13 DIAGNOSIS — J9601 Acute respiratory failure with hypoxia: Secondary | ICD-10-CM | POA: Insufficient documentation

## 2015-09-13 DIAGNOSIS — R591 Generalized enlarged lymph nodes: Secondary | ICD-10-CM

## 2015-09-13 DIAGNOSIS — D72829 Elevated white blood cell count, unspecified: Secondary | ICD-10-CM

## 2015-09-13 DIAGNOSIS — J9600 Acute respiratory failure, unspecified whether with hypoxia or hypercapnia: Secondary | ICD-10-CM

## 2015-09-13 DIAGNOSIS — R401 Stupor: Secondary | ICD-10-CM

## 2015-09-13 DIAGNOSIS — R509 Fever, unspecified: Secondary | ICD-10-CM

## 2015-09-13 LAB — CBC
HCT: 24.1 % — ABNORMAL LOW (ref 36.0–46.0)
HEMOGLOBIN: 7.6 g/dL — AB (ref 12.0–15.0)
MCH: 27 pg (ref 26.0–34.0)
MCHC: 31.5 g/dL (ref 30.0–36.0)
MCV: 85.5 fL (ref 78.0–100.0)
PLATELETS: 280 10*3/uL (ref 150–400)
RBC: 2.82 MIL/uL — ABNORMAL LOW (ref 3.87–5.11)
RDW: 14.5 % (ref 11.5–15.5)
WBC: 14.8 10*3/uL — ABNORMAL HIGH (ref 4.0–10.5)

## 2015-09-13 LAB — GLUCOSE, CAPILLARY
GLUCOSE-CAPILLARY: 119 mg/dL — AB (ref 65–99)
GLUCOSE-CAPILLARY: 123 mg/dL — AB (ref 65–99)
GLUCOSE-CAPILLARY: 140 mg/dL — AB (ref 65–99)
Glucose-Capillary: 123 mg/dL — ABNORMAL HIGH (ref 65–99)
Glucose-Capillary: 121 mg/dL — ABNORMAL HIGH (ref 65–99)
Glucose-Capillary: 152 mg/dL — ABNORMAL HIGH (ref 65–99)

## 2015-09-13 LAB — RENAL FUNCTION PANEL
ALBUMIN: 2 g/dL — AB (ref 3.5–5.0)
ALBUMIN: 2.1 g/dL — AB (ref 3.5–5.0)
ANION GAP: 16 — AB (ref 5–15)
Anion gap: 17 — ABNORMAL HIGH (ref 5–15)
BUN: 109 mg/dL — AB (ref 6–20)
BUN: 75 mg/dL — AB (ref 6–20)
CHLORIDE: 95 mmol/L — AB (ref 101–111)
CHLORIDE: 99 mmol/L — AB (ref 101–111)
CO2: 29 mmol/L (ref 22–32)
CO2: 26 mmol/L (ref 22–32)
CREATININE: 2.41 mg/dL — AB (ref 0.44–1.00)
Calcium: 8.8 mg/dL — ABNORMAL LOW (ref 8.9–10.3)
Calcium: 9 mg/dL (ref 8.9–10.3)
Creatinine, Ser: 1.59 mg/dL — ABNORMAL HIGH (ref 0.44–1.00)
GFR calc Af Amer: 28 mL/min — ABNORMAL LOW (ref >60)
GFR, EST AFRICAN AMERICAN: 46 mL/min — AB (ref >60)
GFR, EST NON AFRICAN AMERICAN: 24 mL/min — AB (ref >60)
GFR, EST NON AFRICAN AMERICAN: 40 mL/min — AB (ref >60)
Glucose, Bld: 142 mg/dL — ABNORMAL HIGH (ref 65–99)
Glucose, Bld: 134 mg/dL — ABNORMAL HIGH (ref 65–99)
PHOSPHORUS: 5.8 mg/dL — AB (ref 2.5–4.6)
PHOSPHORUS: 4.6 mg/dL (ref 2.5–4.6)
POTASSIUM: 3.9 mmol/L (ref 3.5–5.1)
Potassium: 3.8 mmol/L (ref 3.5–5.1)
Sodium: 141 mmol/L (ref 135–145)
Sodium: 141 mmol/L (ref 135–145)

## 2015-09-13 LAB — BLOOD GAS, ARTERIAL
ACID-BASE EXCESS: 2.8 mmol/L — AB (ref 0.0–2.0)
Acid-Base Excess: 2.9 mmol/L — ABNORMAL HIGH (ref 0.0–2.0)
BICARBONATE: 28 meq/L — AB (ref 20.0–24.0)
Bicarbonate: 27.2 mEq/L — ABNORMAL HIGH (ref 20.0–24.0)
DRAWN BY: 441261
Drawn by: 441261
FIO2: 0.6
FIO2: 0.6
LHR: 16 {breaths}/min
MECHVT: 450 mL
O2 Saturation: 91 %
O2 Saturation: 93.7 %
PCO2 ART: 46.3 mmHg — AB (ref 35.0–45.0)
PCO2 ART: 43.8 mmHg (ref 35.0–45.0)
PEEP: 14 cmH2O
PEEP: 14 cmH2O
PH ART: 7.409 (ref 7.350–7.450)
PO2 ART: 75.4 mmHg — AB (ref 80.0–100.0)
Patient temperature: 96.5
Patient temperature: 98.6
RATE: 24 resp/min
TCO2: 26.9 mmol/L (ref 0–100)
TCO2: 25.9 mmol/L (ref 0–100)
VT: 500 mL
pH, Arterial: 7.391 (ref 7.350–7.450)
pO2, Arterial: 64.3 mmHg — ABNORMAL LOW (ref 80.0–100.0)

## 2015-09-13 LAB — HEPATIC FUNCTION PANEL
ALT: 59 U/L — AB (ref 14–54)
AST: 233 U/L — AB (ref 15–41)
Albumin: 2 g/dL — ABNORMAL LOW (ref 3.5–5.0)
Alkaline Phosphatase: 405 U/L — ABNORMAL HIGH (ref 38–126)
BILIRUBIN DIRECT: 1.8 mg/dL — AB (ref 0.1–0.5)
Indirect Bilirubin: 0.9 mg/dL (ref 0.3–0.9)
TOTAL PROTEIN: 6.5 g/dL (ref 6.5–8.1)
Total Bilirubin: 2.7 mg/dL — ABNORMAL HIGH (ref 0.3–1.2)

## 2015-09-13 LAB — URIC ACID: URIC ACID, SERUM: 1.2 mg/dL — AB (ref 2.3–6.6)

## 2015-09-13 LAB — LACTATE DEHYDROGENASE: LDH: 1188 U/L — AB (ref 98–192)

## 2015-09-13 LAB — MAGNESIUM: MAGNESIUM: 2.8 mg/dL — AB (ref 1.7–2.4)

## 2015-09-13 LAB — APTT: aPTT: 55 seconds — ABNORMAL HIGH (ref 24–37)

## 2015-09-13 LAB — HEPARIN LEVEL (UNFRACTIONATED): HEPARIN UNFRACTIONATED: 0.41 [IU]/mL (ref 0.30–0.70)

## 2015-09-13 LAB — AMMONIA: Ammonia: 34 umol/L (ref 9–35)

## 2015-09-13 MED ORDER — CHLORHEXIDINE GLUCONATE 0.12 % MT SOLN
OROMUCOSAL | Status: AC
  Administered 2015-09-13: 15 mL via OROMUCOSAL
  Filled 2015-09-13: qty 15

## 2015-09-13 MED ORDER — FLUCONAZOLE IN SODIUM CHLORIDE 400-0.9 MG/200ML-% IV SOLN
400.0000 mg | INTRAVENOUS | Status: DC
  Administered 2015-09-13 – 2015-09-15 (×3): 400 mg via INTRAVENOUS
  Filled 2015-09-13 (×4): qty 200

## 2015-09-13 MED ORDER — ARTIFICIAL TEARS OP OINT
TOPICAL_OINTMENT | OPHTHALMIC | Status: DC | PRN
  Administered 2015-09-13: 1 via OPHTHALMIC
  Administered 2015-09-14: via OPHTHALMIC
  Filled 2015-09-13: qty 3.5

## 2015-09-13 MED ORDER — LOPERAMIDE HCL 1 MG/5ML PO LIQD
2.0000 mg | ORAL | Status: DC | PRN
  Administered 2015-09-13: 2 mg via ORAL
  Filled 2015-09-13 (×2): qty 10

## 2015-09-13 MED ORDER — POLYVINYL ALCOHOL 1.4 % OP SOLN
1.0000 [drp] | OPHTHALMIC | Status: DC | PRN
  Administered 2015-09-13 – 2015-09-25 (×3): 1 [drp] via OPHTHALMIC
  Filled 2015-09-13: qty 15

## 2015-09-13 NOTE — Progress Notes (Signed)
Noticed small amount of cream colored drainage in subglottic tubing.  Stopped tube feedings and checked tube feeding placement.  Feeding tube had slide through the securement tape and OG tube had migrated to oral cavity.  Removed OG tube.  Suctioned mouth.  Removed a small amount of cream colored drainage.  Patient also had a small brown mucous plug in inline airway.  Notified RT that ET suction would not go down as far as it needed to.  RT resolved the problems.  Called Elink to notify of OG tube removal.

## 2015-09-13 NOTE — Progress Notes (Signed)
Monique [name]   DOB:1975/11/24   ZO#:[id number]   JWJ#:[id number]   Subjective: patient opens eyes when addressed; husband, father, brother and mother in law in room   Objective: young White woman examined in bed Filed Vitals:   09/13/15 0830 09/13/15 0906  BP: 126/77 126/68  Pulse: 100 104  Temp:    Resp: 25 26    Body mass index is 35.57 kg/(m^2).  Intake/Output Summary (Last 24 hours) at 09/13/15 0918 Last data filed at 09/13/15 0900  Gross per 24 hour  Intake 1691.58 ml  Output   3259 ml  Net -1567.42 ml     Do not hear wheezes or rales over lung fields--auscultated anterolaterally  RRR  Noisy room, no obvious BS    CBG (last 3)   Recent Labs  09/13/15 0044 09/13/15 0424 09/13/15 0755  GLUCAP 140* 123* 123*     Labs:  Lab Results  Component Value Date   WBC 14.8* 09/13/2015   HGB 7.6* 09/13/2015   HCT 24.1* 09/13/2015   MCV 85.5 09/13/2015   PLT 280 09/13/2015   NEUTROABS 16.2* 09/02/2015    '@LASTCHEMISTRY'$ @  Urine Studies No results for input(s): UHGB, CRYS in the last 72 hours.  Invalid input(s): UACOL, UAPR, USPG, UPH, UTP, UGL, UKET, UBIL, UNIT, UROB, ULEU, UEPI, UWBC, URBC, UBAC, CAST, [place], [place]  Basic Metabolic Panel:  Recent Labs Lab 09/08/15 0400  09/10/15 0530 09/11/15 0420 09/12/15 0335 09/12/15 1730 09/13/15 0430  NA 154*  < > 148* 143 142 139 141  K 3.5  < > 4.0 4.8 3.8 3.5 3.8  CL 113*  < > 105 95* 87* 88* 95*  CO2 25  < > 28 29 32 32 29  GLUCOSE 128*  < > 140* 156* 139* 145* 142*  BUN 64*  < > 108* 111* 135* 167* 109*  CREATININE 1.36*  < > 2.55* 2.94* 3.60* 3.86* 2.41*  CALCIUM 8.3*  < > 8.9 8.8* 8.4* 8.2* 8.8*  MG 2.9*  --  3.1*  --  3.0*  --  2.8*  PHOS 3.4  --  4.4  --  9.2* 9.0* 5.8*  < > = values in this interval not displayed. GFR Estimated Creatinine Clearance: 36.4 mL/min (by C-G formula based on Cr of 2.41). Liver Function Tests:  Recent Labs Lab 09/07/15 0430 09/08/15 0400 09/09/15 0530 09/11/15 0420  09/12/15 1730 09/13/15 0430  AST 128* 140* 122* 224*  --   --   ALT '23 24 24 '$ 46  --   --   ALKPHOS 131* 128* 134* 228*  --   --   BILITOT 1.5* 1.8* 1.4* 1.1  --   --   PROT 5.8* 6.1* 5.8* 6.0*  --   --   ALBUMIN 1.7* 1.8* 1.7* 2.0* 1.9* 2.0*    Recent Labs Lab 09/07/15 0430  LIPASE 134*  AMYLASE 99   No results for input(s): AMMONIA in the last 168 hours. Coagulation profile No results for input(s): INR, PROTIME in the last 168 hours.  CBC:  Recent Labs Lab 09/08/15 0400 09/09/15 0530 09/10/15 0530 09/12/15 0335 09/13/15 0430  WBC 18.7* 24.0* 18.5* 20.9* 14.8*  HGB 9.3* 8.7* 8.3* 7.4* 7.6*  HCT 29.9* 28.6* 27.9* 23.9* 24.1*  MCV 86.7 88.5 90.6 89.2 85.5  PLT 287 287 290 283 280   Cardiac Enzymes:  Recent Labs Lab 09/07/15 0100 09/07/15 1123 09/07/15 1750  TROPONINI 0.11* 0.09* 0.09*   BNP: Invalid input(s): POCBNP CBG:  Recent Labs Lab 09/12/15  1643 09/12/15 2008 09/13/15 0044 09/13/15 0424 09/13/15 0755  GLUCAP 132* 152* 140* 123* 123*   D-Dimer No results for input(s): DDIMER in the last 72 hours. Hgb A1c No results for input(s): HGBA1C in the last 72 hours. Lipid Profile No results for input(s): CHOL, HDL, LDLCALC, TRIG, CHOLHDL, LDLDIRECT in the last 72 hours. Thyroid function studies No results for input(s): TSH, T4TOTAL, T3FREE, THYROIDAB in the last 72 hours.  Invalid input(s): FREET3 Anemia work up No results for input(s): VITAMINB12, FOLATE, FERRITIN, TIBC, IRON, RETICCTPCT in the last 72 hours. Microbiology Recent Results (from the past 240 hour(s))  Culture, respiratory (NON-Expectorated)     Status: None   Collection Time: 09/06/15 11:55 AM  Result Value Ref Range Status   Specimen Description TRACHEAL ASPIRATE  Final   Special Requests NONE  Final   Gram Stain   Final    MODERATE WBC PRESENT, PREDOMINANTLY PMN RARE SQUAMOUS EPITHELIAL CELLS PRESENT RARE YEAST Performed at [facility]    Culture   Final     MODERATE YEAST CONSISTENT WITH CANDIDA SPECIES Performed at [facility]    Report Status 09/09/2015 FINAL  Final  Culture, blood (Routine X 2) w Reflex to ID Panel     Status: None   Collection Time: 09/06/15 12:09 PM  Result Value Ref Range Status   Specimen Description BLOOD LEFT HAND  Final   Special Requests BOTTLES DRAWN AEROBIC AND ANAEROBIC [place]  Final   Culture   Final    NO GROWTH 5 DAYS Performed at [facility]    Report Status 09/11/2015 FINAL  Final  Culture, blood (Routine X 2) w Reflex to ID Panel     Status: None   Collection Time: 09/06/15 12:09 PM  Result Value Ref Range Status   Specimen Description BLOOD LEFT HAND  Final   Special Requests BOTTLES DRAWN AEROBIC AND ANAEROBIC [place]  Final   Culture   Final    NO GROWTH 5 DAYS Performed at [facility]    Report Status 09/11/2015 FINAL  Final  Culture, bal-quantitative     Status: None   Collection Time: 09/07/15  4:00 PM  Result Value Ref Range Status   Specimen Description BRONCHIAL WASHINGS RIGHT  Final   Special Requests NONE  Final   Gram Stain   Final    RARE WBC PRESENT, PREDOMINANTLY MONONUCLEAR NO SQUAMOUS EPITHELIAL CELLS SEEN NO ORGANISMS SEEN Performed at [place]   Final    5,000 COLONIES/ML Performed at [facility]    Culture   Final    YEAST CONSISTENT WITH CANDIDA SPECIES Performed at [facility]    Report Status 09/09/2015 FINAL  Final  Fungus Culture with Smear     Status: None (Preliminary result)   Collection Time: 09/07/15  4:00 PM  Result Value Ref Range Status   Specimen Description BRONCHIAL WASHINGS RIGHT  Final   Special Requests NONE  Final   Fungal Smear   Final    NO YEAST OR FUNGAL ELEMENTS SEEN Performed at [facility]    Culture   Final    CANDIDA ALBICANS Performed at [facility]    Report Status PENDING  Incomplete      Studies:  Dg Chest Port 1 View  09/12/2015   CLINICAL DATA:  Respiratory failure, postobstructive new pneumonia due to lung mass, sepsis, intubation. EXAM: PORTABLE CHEST 1 VIEW COMPARISON:  Chest x-ray of September 10, 2015 FINDINGS: The lungs are well-expanded. Confluent alveolar opacities persist bilaterally and are slightly more conspicuous especially on the left. Confluent density in the right mid and lower lung is also more conspicuous. A small right pleural effusion remains. The cardiac silhouette is top-normal in size. The endotracheal tube tip lies 4 cm above the carina. The esophagogastric tube tip projects below the inferior margin of the image. The left internal jugular venous catheter tip projects over the midportion of the SVC. IMPRESSION: Slight interval deterioration in the appearance of both lungs suggesting progressive pneumonia. The support tubes are in reasonable position. Electronically Signed   By: David  Martinique M.D.   On: 09/12/2015 07:13    Assessment: 40 y.o. [place] woman with stage IV neuroendocrine carcinoma involving lungs, pelvis and lymph nodes, complicated by respiratory failure requiring intubation, renal failure requiring CRRT, DVT on hepatin, fever and leukocytosis on antibiotics  (1) metastatic carcinoma: likely neuroendocrine, today is day 4 cycle 1 carboplatin/ etoposide, with only day 1 treatment given secondary to patient's other comorbidities; if patient survives next cycle would start in 17 days  (2) acute renal failure: multifactorial, improved on CRRT w improved numbers but so far only minimally improved mentation-- renal US 09/06/2015 showed nu hydronephrosis--greatly appreciate renal's help  (3) respiratory failure: requiring mechanical ventilation, critical care managing  (4) fever, leukocytosis: BAL showing candida-- will ask pharmacy to dose diflucan  (5) mental status changes: likely multifacotria: will check LFTs, ammonia level; negative non-contarst head CT; neurology evaluation pending  (6)  advanced directives: DNR in place  Plan: I met with patient's husband Monique [name], her father Monique [name], her brother Monique [name], and Chris's mother Monique [name]. Reviewed the diagnostic issues and they understand we are treating this as an aggressive neuroendocrine tumor--the actual origin (lung to pelvis or pelvis to lung) does not matter. She only received day 1 and I agree with Dr Benay Spice that proceeding to days 2-3 in light of her worsening situation had a significant risk or hurting her more than helping her. That is even more the case now that we are day 4. She may have a drop in her WBC and I would wait until we are assured of WBC recovery before considering a second cycle (normally repeated day 22). The family understands and agrees with this plan.  I will recheck liver functions and and ammonia, L:DH and uric acid today and tomorrow. Will add diflucan to ABX.Marland Kitchen Otherwise the plan is to continue supportive care as you are doing. Ms Criss will either improve over the next few days and perhaps come off the ventilator (she will still have uincurable stage IV cancer) or she will continue to deteriorate. Appropriately she has a DNR in place and I supported that decision.   I suggested to Monique [name] he look into [facility] for their 40 y/o--in my experience that can be very helpful.  Will follow with you.   Chauncey Cruel, MD 09/13/2015  9:18 AM Medical Oncology and Hematology [facility] [address] [place], [place] [address] Tel. [phone]    Fax. [phone]

## 2015-09-13 NOTE — Progress Notes (Signed)
Offsite EEG completed at [facility], results pending.

## 2015-09-13 NOTE — Progress Notes (Signed)
RT called to the bedside of patient for decreased O2 saturations and vent alarming. Patient pressures 50-63 (Peak) and plateau pressures 40's. ETT suction cath was difficult to pass and RT lavaged patient in attempt to "loosen" any mucus plugging ETT. A small, brown plug was located at the end of cath and RN bagged patient with a PEEP valve +14 and 100% FiO2 while RT removed plug. Pressures remained elevated, but suction cath was easy to pass and O2 sats increased. 5-10 minutes later, patient began to alarm again and RT came to bedside to assess patient. Patient was lavaged again and a larger brown plug was suctioned into the beginning of the suction cath. Mucus plug was about half the size of a dime, brown, and thick/tenacious in nature. Patient pressures decreased to 38-50 (Peak) post-lavage and suction.  RN aware of second suctioning and mucus plug. RT will continue to monitor patient.

## 2015-09-13 NOTE — Progress Notes (Signed)
Pt's husband Gerald Stabs) and friends Sports coach and his wife) were bedside when I arrived. Gerald Stabs said pt showed some responsiveness today, moving her eyes when he touched her. Husband is maintaining hope with each response. With pt's friends, we had bedside prayer for pt and husband. Chaplain Ernest Haber, M.Div.   09/13/15 2000  Clinical Encounter Type  Visited With Family

## 2015-09-13 NOTE — Progress Notes (Signed)
ANTICOAGULATION CONSULT NOTE - Follow up  Pharmacy Consult for heparin IV  Indication: DVT noted in the subclavian vein and axillary vein.  No Known Allergies  Patient Measurements: Height: 5' 5.5" (166.4 cm) Weight: 217 lb 2.5 oz (98.5 kg) IBW/kg (Calculated) : 58.15 Heparin Dosing Weight: 76.8 kg  Vital Signs: Temp: 96.5 F (35.8 C) (01/28 0400) Temp Source: Axillary (01/28 0400) BP: 123/80 mmHg (01/28 0700) Pulse Rate: 102 (01/28 0700)  Labs:  Recent Labs  09/11/15 0410  09/12/15 0335 09/12/15 1730 09/13/15 0430  HGB  --   --  7.4*  --  7.6*  HCT  --   --  23.9*  --  24.1*  PLT  --   --  283  --  280  APTT  --   --   --   --  55*  HEPARINUNFRC 0.49  --  0.42  --  0.41  CREATININE  --   < > 3.60* 3.86* 2.41*  < > = values in this interval not displayed.  Estimated Creatinine Clearance: 36.4 mL/min (by C-G formula based on Cr of 2.41).   Medical History: History reviewed. No pertinent past medical history.  Medications:  Scheduled:  . antiseptic oral rinse  7 mL Mouth Rinse BID  . antiseptic oral rinse  7 mL Mouth Rinse QID  . budesonide (PULMICORT) nebulizer solution  0.5 mg Nebulization BID  . cefTAZidime (FORTAZ)  IV  2 g Intravenous Q12H  . chlorhexidine gluconate  15 mL Mouth Rinse BID  . levalbuterol  0.63 mg Nebulization Q6H  . pantoprazole sodium  40 mg Per Tube Daily  . sodium chloride  10-40 mL Intracatheter Q12H  . sodium chloride  3 mL Intravenous Q12H    Assessment: Pt is a 40 yo female diagnosed with DVT noted in the subclavian vein and axillary vein. Pt is currently intubated with acute hypoxic respiratory failure secondary to post-obstructive pneumonia, metastatic cancer, and wheeze from extrinsic airway compression. Hgb has been trending down recently, will monitor closely.   Today, 09/13/2015:  Heparin level remains in the target range on 1300units/hr.  H/H and platelets are stable. No bleeding reported/documented.   started on CRRT  yesterday afternoon  Goal of Therapy:  Heparin level 0.3-0.7 units/ml Monitor platelets by anticoagulation protocol: Yes   Plan:  Cont heparin infusion at 1300 units/hr. Heparin level daily. Check CBC q24h while on heparin. F/u daily.  Dolly Rias RPh 09/13/2015, 7:26 AM Pager [phone]

## 2015-09-13 NOTE — Progress Notes (Signed)
Prevalon boots applied to bilateral feet.  Foot drop bilateral feet; Left foot rotating outward.

## 2015-09-13 NOTE — Progress Notes (Addendum)
  [facility] Progress Note   Subjective: no problems with CRRT  Filed Vitals:   09/13/15 1030 09/13/15 1100 09/13/15 1130 09/13/15 1200  BP: 131/81 132/78 122/74 123/71  Pulse: 107 107 111 107  Temp:      TempSrc:      Resp: '25 27 25 26  '$ Height:      Weight:      SpO2: 97% 97% 96% 95%    Inpatient medications: . antiseptic oral rinse  7 mL Mouth Rinse BID  . antiseptic oral rinse  7 mL Mouth Rinse QID  . budesonide (PULMICORT) nebulizer solution  0.5 mg Nebulization BID  . cefTAZidime (FORTAZ)  IV  2 g Intravenous Q12H  . chlorhexidine gluconate  15 mL Mouth Rinse BID  . fluconazole (DIFLUCAN) IV  400 mg Intravenous Q24H  . levalbuterol  0.63 mg Nebulization Q6H  . pantoprazole sodium  40 mg Per Tube Daily  . sodium chloride  10-40 mL Intracatheter Q12H  . sodium chloride  3 mL Intravenous Q12H   . feeding supplement (NEPRO CARB STEADY) 1,000 mL (09/13/15 0506)  . heparin 1,300 Units (09/13/15 1200)  . dialysis replacement fluid (prismasate) 400 mL/hr at 09/13/15 0612  . dialysis replacement fluid (prismasate) 200 mL/hr at 09/12/15 1659  . dialysate (PRISMASATE) 2,000 mL/hr at 09/13/15 1130   alteplase, alteplase, fentaNYL (SUBLIMAZE) injection, heparin, heparin lock flush, heparin lock flush, hydrALAZINE, levalbuterol, loperamide, midazolam, [DISCONTINUED] ondansetron **OR** ondansetron (ZOFRAN) IV, polyvinyl alcohol, simethicone, sodium chloride, sodium chloride, sodium chloride flush, sodium chloride flush  Exam: Obtunded , on vent , unresponsive No jvd Chest coarse BS bilat RRR no mrg Abd soft mod distended no mass GU foley in place Ext diffuse 2-3+ ext edema x 4 Neuro not responding   CXR RML consolidation, scattered nodular changes UA 1/21 > protein neg, 0-5 rbc/wbc's   Assessment: 1 Acute renal failure - suspect ATN, oliguric. CRRT #2.  2 Vol excess up 16kg 3 Anemia Hb 7.4 4 Metastatic cancer, neuroendocrine type-  SP chemo x 1 on 1/25 5  Resp failure - PNA/ nodular mets, poss vol excess. FiO2 down 0.60 today  Plan - cont CRRT, increase UF goal 150-250/ hr   Kelly Splinter MD [facility] pager [phone]    cell [phone] 09/13/2015, 12:44 PM    Recent Labs Lab 09/12/15 0335 09/12/15 1730 09/13/15 0430  NA 142 139 141  K 3.8 3.5 3.8  CL 87* 88* 95*  CO2 32 32 29  GLUCOSE 139* 145* 142*  BUN 135* 167* 109*  CREATININE 3.60* 3.86* 2.41*  CALCIUM 8.4* 8.2* 8.8*  PHOS 9.2* 9.0* 5.8*    Recent Labs Lab 09/09/15 0530 09/11/15 0420 09/12/15 1730 09/13/15 0430 09/13/15 1040  AST 122* 224*  --   --  233*  ALT 24 46  --   --  59*  ALKPHOS 134* 228*  --   --  405*  BILITOT 1.4* 1.1  --   --  2.7*  PROT 5.8* 6.0*  --   --  6.5  ALBUMIN 1.7* 2.0* 1.9* 2.0* 2.0*    Recent Labs Lab 09/10/15 0530 09/12/15 0335 09/13/15 0430  WBC 18.5* 20.9* 14.8*  HGB 8.3* 7.4* 7.6*  HCT 27.9* 23.9* 24.1*  MCV 90.6 89.2 85.5  PLT 290 283 280

## 2015-09-13 NOTE — Progress Notes (Signed)
Pt's husband and friend of pt were bedside when I arrived. Husband was appropriately tearful as he shared how quickly pt became ill and unresponsive. He also told me how he and his wife met and shared a picture of her and their 41-yr-old son. CH provided comfort and listening support and prayer to pt's husband and friend. [place], [place]   09/12/15 2105  Clinical Encounter Type  Visited With Family  Visit Type Follow-up  Referral From Nurse

## 2015-09-13 NOTE — Progress Notes (Addendum)
PCCM PROGRESS NOTE  ADMISSION DATE: 09/03/2015 CONSULT DATE: 08/30/2015 REFERRING PROVIDER: Dr. Roel Cluck, [facility]  CC: Short of breath  SUBJECTIVE:  cvvhd started  VITAL SIGNS: BP 126/74 mmHg  Pulse 101  Temp(Src) 96.5 F (35.8 C) (Axillary)  Resp 24  Ht 5' 5.5" (1.664 m)  Wt 98.5 kg (217 lb 2.5 oz)  BMI 35.57 kg/m2  SpO2 99%  LMP 09/12/2015  INTAKE/OUTPUT: I/O last 3 completed shifts: In: 3957.6 [I.V.:1877.6; NG/GT:1980; IV Piggyback:100] Out: 7124 [PYKDX:833; ASNKN:3976; Stool:700]  General: not responsive HEENT: ETT, No JVD Cardiac: regular, No MRG Chest: coarse BS, wheezing scattered  Abd: Distended mild. + BS, no r//g Ext: 3+ edema Neuro: no response, perr 5 mm brisk Skin: no rashes  CBC Recent Labs     09/12/15  0335  09/13/15  0430  WBC  20.9*  14.8*  HGB  7.4*  7.6*  HCT  23.9*  24.1*  PLT  283  280    Coag's Recent Labs     09/13/15  0430  APTT  55*    BMET Recent Labs     09/12/15  0335  09/12/15  1730  09/13/15  0430  NA  142  139  141  K  3.8  3.5  3.8  CL  87*  88*  95*  CO2  32  32  29  BUN  135*  167*  109*  CREATININE  3.60*  3.86*  2.41*  GLUCOSE  139*  145*  142*    Electrolytes Recent Labs     09/12/15  0335  09/12/15  1730  09/13/15  0430  CALCIUM  8.4*  8.2*  8.8*  MG  3.0*   --   2.8*  PHOS  9.2*  9.0*  5.8*    Sepsis Markers No results for input(s): PROCALCITON, O2SATVEN in the last 72 hours.  Invalid input(s): LACTICACIDVEN  ABG No results for input(s): PHART, PCO2ART, PO2ART in the last 72 hours.  Liver Enzymes Recent Labs     09/11/15  0420  09/12/15  1730  09/13/15  0430  AST  224*   --    --   ALT  46   --    --   ALKPHOS  228*   --    --   BILITOT  1.1   --    --   ALBUMIN  2.0*  1.9*  2.0*    Cardiac Enzymes No results for input(s): TROPONINI, PROBNP in the last 72 hours.  Glucose Recent Labs     09/12/15  0811  09/12/15  1150  09/12/15  1643  09/12/15  2008  09/13/15  0044   09/13/15  0424  GLUCAP  138*  117*  132*  152*  140*  123*    Imaging Dg Chest Port 1 View  09/12/2015  CLINICAL DATA:  Respiratory failure, postobstructive new pneumonia due to lung mass, sepsis, intubation. EXAM: PORTABLE CHEST 1 VIEW COMPARISON:  Chest x-ray of September 10, 2015 FINDINGS: The lungs are well-expanded. Confluent alveolar opacities persist bilaterally and are slightly more conspicuous especially on the left. Confluent density in the right mid and lower lung is also more conspicuous. A small right pleural effusion remains. The cardiac silhouette is top-normal in size. The endotracheal tube tip lies 4 cm above the carina. The esophagogastric tube tip projects below the inferior margin of the image. The left internal jugular venous catheter tip projects over the midportion of the SVC. IMPRESSION: Slight interval deterioration  in the appearance of both lungs suggesting progressive pneumonia. The support tubes are in reasonable position. Electronically Signed   By: David  Martinique M.D.   On: 09/12/2015 07:13  worsening aeration   CULTURES: 1/13 Blood >> neg 1/14 Pneumococcal Ag >> negative 1/14 Legionella Ag >> negative 1/15 C diff PCR >> negative 1/22 BAL>>>yeast candida only 5 k BCX2 1/21>>>  ANTIBIOTICS: 1/13 Rocephin >> 1/13 1/13 Zithromax >> 1/13 1/14 Vancomycin >> 1/19, restart 1/21>>> 1/14 Zosyn >> 1/20. 1/21 Ceftaz >>>  LINES/TUBES: 1/15 Rt PICC >> 1/24 Left IJ CVL 1/24>>>  STUDIES: 1/13 CT chest >> multiple b/l nodules, mass like consolidation Rt mid lung, Rt hilar and subcarinal mass, Lt hilar LAN, 2.7 cm Rt paratracheal LAN 1/14 CT abd/pelvis >> 10 cm mass superior to uterus 1/15 Tumor markers >> CA 19-9 453, CEA 3.9, CA 125 780.8 1/16 Bronchoscopy >> crush artifact ?small cell lung cancer (non diagnostic)  EVENTS: 1/13 Admit 1/15 Gyn oncology consulted 1/16 Bronchoscopy in ICU; Placed on BiPAP for wheezing, dyspnea 1/19 IR biopsy>>>Poorly differentiated  high Grade Carcinoma.  >stains favor Neuroendocrine. TTF stain was NEGATIVE 1/22 bronch cytology:   DISCUSSION: 40 yo female with progressive dyspnea, wheezing.  She was found to have abnormal CT chest/abdomen/pelvis from metastatic cancer. 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 Long d/w pt's family: re: AKI, uremia, worsening MS, worsening aeration. We were very clear that this is very much a heroic attempt and the likelihood that outcome will be poor. She did get one dose of chemo, but now she continues to decline. Given her rising creatinine she is not a candidate for further chemo at this point.  We will cont supportive care, limit nephro-toxins. The husband is requesting nephrology consult and asking for CRRT by name. We were very clear that there are no guarantees that this will help and the likely scenario here is that she improves enough to suffer from her illness. We are rapidly approaching the level of futility. Have asked for a nephrology consult Do not think that we should offer CRRT, but husband is insistent that we ask. Will continue to discuss this w/ the husband and family.   ASSESSMENT/PLAN:  PULMONARY A: Acute hypoxic respiratory failure 2nd to post-obstructive pneumonia, metastatic cancer, and wheeze from extrinsic airway compression. Probably element of PE O2 requirements and CXR looking worse.   P: -hep drip -cvvhd to neg balance -likely need higher MV, rate to 22 , repeat abg -pcxr in am for worsening collapse rt? 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 DVT: left peroneal vein and Right UE extending into the Subclavian -->picc removed.  P: Heparin gtt for PE/DVT Further recs per heme/onc. 1/26 & 1/27 chemo on hold Cbc in am   INFECTION A: Sepsis 2nd to post-obstructive pneumonia. Fevers and elevated LA are likely secondary to tumor P: - FPCT not utilized in  chemo, dc any further pct - Restarted on broad spectrum antibiotics, maintain -drop in temp from cvvhd  ENDOCRINE A: No acute issues. P: - monitor blood glucose on BMET  NEUROLOGY A: Cancer pain. Acute Encephalopathy. Suspect that this is r/t her AKI and uremia  R/o subclinical seizures, mets P: - fentanyl for pain -eeg  Ccm time 30 min  updatedf fmaily  Lavon Paganini. Titus Mould, MD, [place] Pgr: [facility]

## 2015-09-13 NOTE — Progress Notes (Signed)
ANTIBIOTIC CONSULT NOTE - INITIAL  Pharmacy Consult for fluconazole Indication: candida  No Known Allergies  Patient Measurements: Height: 5' 5.5" (166.4 cm) Weight: 217 lb 2.5 oz (98.5 kg) IBW/kg (Calculated) : 58.15   Vital Signs: Temp: 97.7 F (36.5 C) (01/28 0800) Temp Source: Axillary (01/28 0800) BP: 131/81 mmHg (01/28 1030) Pulse Rate: 107 (01/28 1030) Intake/Output from previous day: 01/27 0701 - 01/28 0700 In: 1891.6 [I.V.:521.6; NG/GT:1320; IV Piggyback:50] Out: 2898 [Urine:199; Stool:325] Intake/Output from this shift: Total I/O In: 357 [I.V.:52; NG/GT:255; IV Piggyback:50] Out: 593 [Other:593]  Labs:  Recent Labs  09/12/15 0335 09/12/15 1730 09/13/15 0430  WBC 20.9*  --  14.8*  HGB 7.4*  --  7.6*  PLT 283  --  280  CREATININE 3.60* 3.86* 2.41*   Estimated Creatinine Clearance: 36.4 mL/min (by C-G formula based on Cr of 2.41). No results for input(s): VANCOTROUGH, VANCOPEAK, VANCORANDOM, GENTTROUGH, GENTPEAK, GENTRANDOM, TOBRATROUGH, TOBRAPEAK, TOBRARND, AMIKACINPEAK, AMIKACINTROU, AMIKACIN in the last 72 hours.   Microbiology: Recent Results (from the past 720 hour(s))  Culture, blood (Routine X 2) w Reflex to ID Panel     Status: None   Collection Time: 08/26/2015  5:40 PM  Result Value Ref Range Status   Specimen Description BLOOD LEFT ANTECUBITAL  Final   Special Requests BOTTLES DRAWN AEROBIC ONLY [place]  Final   Culture   Final    NO GROWTH 5 DAYS Performed at [facility]    Report Status 09/03/2015 FINAL  Final  Culture, blood (Routine X 2) w Reflex to ID Panel     Status: None   Collection Time: 09/07/2015  5:45 PM  Result Value Ref Range Status   Specimen Description BLOOD LEFT ANTECUBITAL  Final   Special Requests BOTTLES DRAWN AEROBIC AND ANAEROBIC [place]  Final   Culture   Final    NO GROWTH 5 DAYS Performed at [facility]    Report Status 09/03/2015 FINAL  Final  MRSA PCR Screening     Status: None   Collection Time:  08/30/15  2:30 PM  Result Value Ref Range Status   MRSA by PCR NEGATIVE NEGATIVE Final    Comment:        The GeneXpert MRSA Assay (FDA approved for NASAL specimens only), is one component of a comprehensive MRSA colonization surveillance program. It is not intended to diagnose MRSA infection nor to guide or monitor treatment for MRSA infections.   C difficile quick scan w PCR reflex     Status: None   Collection Time: 08/31/15 12:38 PM  Result Value Ref Range Status   C Diff antigen NEGATIVE NEGATIVE Final   C Diff toxin NEGATIVE NEGATIVE Final   C Diff interpretation Negative for toxigenic C. difficile  Final  Culture, respiratory (NON-Expectorated)     Status: None   Collection Time: 09/06/15 11:55 AM  Result Value Ref Range Status   Specimen Description TRACHEAL ASPIRATE  Final   Special Requests NONE  Final   Gram Stain   Final    MODERATE WBC PRESENT, PREDOMINANTLY PMN RARE SQUAMOUS EPITHELIAL CELLS PRESENT RARE YEAST Performed at [facility]    Culture   Final    MODERATE YEAST CONSISTENT WITH CANDIDA SPECIES Performed at [facility]    Report Status 09/09/2015 FINAL  Final  Culture, blood (Routine X 2) w Reflex to ID Panel     Status: None   Collection Time: 09/06/15 12:09 PM  Result Value Ref Range Status  Specimen Description BLOOD LEFT HAND  Final   Special Requests BOTTLES DRAWN AEROBIC AND ANAEROBIC [place]  Final   Culture   Final    NO GROWTH 5 DAYS Performed at [facility]    Report Status 09/11/2015 FINAL  Final  Culture, blood (Routine X 2) w Reflex to ID Panel     Status: None   Collection Time: 09/06/15 12:09 PM  Result Value Ref Range Status   Specimen Description BLOOD LEFT HAND  Final   Special Requests BOTTLES DRAWN AEROBIC AND ANAEROBIC [place]  Final   Culture   Final    NO GROWTH 5 DAYS Performed at [facility]    Report Status 09/11/2015 FINAL  Final  Culture, bal-quantitative     Status: None   Collection  Time: 09/07/15  4:00 PM  Result Value Ref Range Status   Specimen Description BRONCHIAL WASHINGS RIGHT  Final   Special Requests NONE  Final   Gram Stain   Final    RARE WBC PRESENT, PREDOMINANTLY MONONUCLEAR NO SQUAMOUS EPITHELIAL CELLS SEEN NO ORGANISMS SEEN Performed at [place]   Final    5,000 COLONIES/ML Performed at [facility]    Culture   Final    YEAST CONSISTENT WITH CANDIDA SPECIES Performed at [facility]    Report Status 09/09/2015 FINAL  Final  Fungus Culture with Smear     Status: None (Preliminary result)   Collection Time: 09/07/15  4:00 PM  Result Value Ref Range Status   Specimen Description BRONCHIAL WASHINGS RIGHT  Final   Special Requests NONE  Final   Fungal Smear   Final    NO YEAST OR FUNGAL ELEMENTS SEEN Performed at [facility]    Culture   Final    CANDIDA ALBICANS Performed at [facility]    Report Status PENDING  Incomplete    Assessment: Pharmacy  familiar with 40 y.o. Female patient with metastatic cancer, pneumonia, DVT . Patient on CVVHD, now asked to dose fluconazole for BAL showing candida.  Goal of Therapy:  fluconazole per renal function  Plan:  Fluconazole '400mg'$  IV q24h Adjust for renal function/CVVHD  Dolly Rias RPh 09/13/2015, 10:43 AM Pager [phone]

## 2015-09-14 ENCOUNTER — Inpatient Hospital Stay (HOSPITAL_COMMUNITY): Payer: BC Managed Care – PPO

## 2015-09-14 DIAGNOSIS — G9340 Encephalopathy, unspecified: Secondary | ICD-10-CM

## 2015-09-14 DIAGNOSIS — R229 Localized swelling, mass and lump, unspecified: Secondary | ICD-10-CM

## 2015-09-14 DIAGNOSIS — IMO0002 Reserved for concepts with insufficient information to code with codable children: Secondary | ICD-10-CM | POA: Insufficient documentation

## 2015-09-14 LAB — COMPREHENSIVE METABOLIC PANEL
ALK PHOS: 467 U/L — AB (ref 38–126)
ALT: 73 U/L — ABNORMAL HIGH (ref 14–54)
ANION GAP: 14 (ref 5–15)
AST: 248 U/L — AB (ref 15–41)
Albumin: 2 g/dL — ABNORMAL LOW (ref 3.5–5.0)
BUN: 62 mg/dL — ABNORMAL HIGH (ref 6–20)
CALCIUM: 9.3 mg/dL (ref 8.9–10.3)
CO2: 27 mmol/L (ref 22–32)
Chloride: 99 mmol/L — ABNORMAL LOW (ref 101–111)
Creatinine, Ser: 1.3 mg/dL — ABNORMAL HIGH (ref 0.44–1.00)
GFR calc Af Amer: 59 mL/min — ABNORMAL LOW (ref >60)
GFR calc non Af Amer: 51 mL/min — ABNORMAL LOW (ref >60)
GLUCOSE: 133 mg/dL — AB (ref 65–99)
Potassium: 4.2 mmol/L (ref 3.5–5.1)
Sodium: 140 mmol/L (ref 135–145)
Total Bilirubin: 2.1 mg/dL — ABNORMAL HIGH (ref 0.3–1.2)
Total Protein: 6.8 g/dL (ref 6.5–8.1)

## 2015-09-14 LAB — RENAL FUNCTION PANEL
ALBUMIN: 2 g/dL — AB (ref 3.5–5.0)
ANION GAP: 14 (ref 5–15)
ANION GAP: 13 (ref 5–15)
Albumin: 2 g/dL — ABNORMAL LOW (ref 3.5–5.0)
BUN: 61 mg/dL — ABNORMAL HIGH (ref 6–20)
BUN: 56 mg/dL — ABNORMAL HIGH (ref 6–20)
CHLORIDE: 100 mmol/L — AB (ref 101–111)
CHLORIDE: 98 mmol/L — AB (ref 101–111)
CO2: 26 mmol/L (ref 22–32)
CO2: 24 mmol/L (ref 22–32)
CREATININE: 1.27 mg/dL — AB (ref 0.44–1.00)
Calcium: 9.2 mg/dL (ref 8.9–10.3)
Calcium: 8.7 mg/dL — ABNORMAL LOW (ref 8.9–10.3)
Creatinine, Ser: 1.13 mg/dL — ABNORMAL HIGH (ref 0.44–1.00)
GFR calc Af Amer: >60 mL/min (ref >60)
GFR calc non Af Amer: 60 mL/min — ABNORMAL LOW (ref >60)
GFR, EST NON AFRICAN AMERICAN: 52 mL/min — AB (ref >60)
GLUCOSE: 169 mg/dL — AB (ref 65–99)
Glucose, Bld: 135 mg/dL — ABNORMAL HIGH (ref 65–99)
PHOSPHORUS: 3.5 mg/dL (ref 2.5–4.6)
POTASSIUM: 3.9 mmol/L (ref 3.5–5.1)
Phosphorus: 4 mg/dL (ref 2.5–4.6)
Potassium: 4.2 mmol/L (ref 3.5–5.1)
SODIUM: 140 mmol/L (ref 135–145)
Sodium: 135 mmol/L (ref 135–145)

## 2015-09-14 LAB — MAGNESIUM: MAGNESIUM: 3 mg/dL — AB (ref 1.7–2.4)

## 2015-09-14 LAB — CBC
HCT: 23.8 % — ABNORMAL LOW (ref 36.0–46.0)
HEMOGLOBIN: 7.5 g/dL — AB (ref 12.0–15.0)
MCH: 26.9 pg (ref 26.0–34.0)
MCHC: 31.5 g/dL (ref 30.0–36.0)
MCV: 85.3 fL (ref 78.0–100.0)
Platelets: 280 10*3/uL (ref 150–400)
RBC: 2.79 MIL/uL — AB (ref 3.87–5.11)
RDW: 14.5 % (ref 11.5–15.5)
WBC: 13 10*3/uL — AB (ref 4.0–10.5)

## 2015-09-14 LAB — GLUCOSE, CAPILLARY
GLUCOSE-CAPILLARY: 129 mg/dL — AB (ref 65–99)
GLUCOSE-CAPILLARY: 137 mg/dL — AB (ref 65–99)
GLUCOSE-CAPILLARY: 146 mg/dL — AB (ref 65–99)
Glucose-Capillary: 119 mg/dL — ABNORMAL HIGH (ref 65–99)

## 2015-09-14 LAB — HEPARIN LEVEL (UNFRACTIONATED): Heparin Unfractionated: 0.34 IU/mL (ref 0.30–0.70)

## 2015-09-14 LAB — LACTATE DEHYDROGENASE: LDH: 784 U/L — ABNORMAL HIGH (ref 98–192)

## 2015-09-14 LAB — AMMONIA: Ammonia: 37 umol/L — ABNORMAL HIGH (ref 9–35)

## 2015-09-14 LAB — APTT: APTT: 52 s — AB (ref 24–37)

## 2015-09-14 MED ORDER — MORPHINE SULFATE (PF) 2 MG/ML IV SOLN
1.0000 mg | INTRAVENOUS | Status: DC | PRN
  Administered 2015-09-14 – 2015-09-20 (×5): 2 mg via INTRAVENOUS
  Administered 2015-09-21 (×2): 4 mg via INTRAVENOUS
  Administered 2015-09-22 (×2): 2 mg via INTRAVENOUS
  Administered 2015-09-22: 4 mg via INTRAVENOUS
  Administered 2015-09-22: 2 mg via INTRAVENOUS
  Administered 2015-09-23 (×3): 4 mg via INTRAVENOUS
  Administered 2015-09-23: 2 mg via INTRAVENOUS
  Administered 2015-09-24 (×2): 4 mg via INTRAVENOUS
  Filled 2015-09-14 (×2): qty 1
  Filled 2015-09-14 (×3): qty 2
  Filled 2015-09-14 (×4): qty 1
  Filled 2015-09-14 (×2): qty 2
  Filled 2015-09-14 (×2): qty 1
  Filled 2015-09-14 (×3): qty 2
  Filled 2015-09-14 (×2): qty 1

## 2015-09-14 NOTE — Progress Notes (Signed)
Patient's BP began to drop at 1700, spoke with Dr. Melvyn Novas in the box and verified to start reducing patient fluid removal rate to get systolic BP back to 527. Have reduced the fluid removal rate over the last 60 minutes. Patient fluid removal rate is at 0 at this time due to BP continuing to be low. Will continue to monitor BP every 15 minutes and will restart fluid removal when patient's BP remains stable.

## 2015-09-14 NOTE — Progress Notes (Signed)
Monique [name]   DOB:15-Oct-1975   ZO#:[id number]   JWJ#:[id number]   Subjective: appears less responsive today--does not open eyes to voice or touch--but comfortable; events yesterday reviewed (mucus plugs removed, further fluid removal through CRRT, had EEG w results pending and neuo consult pending); diflucan added; no family in room   Objective: young intubated White woman examined in bed Filed Vitals:   09/14/15 0700 09/14/15 0711  BP: 104/66 104/66  Pulse: 115 120  Temp:    Resp: 30 28    Body mass index is 35.57 kg/(m^2).  Intake/Output Summary (Last 24 hours) at 09/14/15 0920 Last data filed at 09/14/15 0900  Gross per 24 hour  Intake 1917.5 ml  Output   7034 ml  Net -5116.5 ml     BS bilaterally, no wheezes, rales or rhonchi--auscultated anterolaterally  RRR  Do not hear BS, abd soft, no masses plapated  Anasarca    CBG (last 3)   Recent Labs  09/13/15 2358 09/14/15 0434 09/14/15 0802  GLUCAP 146* 119* 137*     Labs:  Lab Results  Component Value Date   WBC 13.0* 09/14/2015   HGB 7.5* 09/14/2015   HCT 23.8* 09/14/2015   MCV 85.3 09/14/2015   PLT 280 09/14/2015   NEUTROABS 16.2* 09/02/2015    '@LASTCHEMISTRY'$ @  Urine Studies No results for input(s): UHGB, CRYS in the last 72 hours.  Invalid input(s): UACOL, UAPR, USPG, UPH, UTP, UGL, UKET, UBIL, UNIT, UROB, ULEU, UEPI, UWBC, URBC, UBAC, CAST, [place], [place]  Basic Metabolic Panel:  Recent Labs Lab 09/08/15 0400  09/10/15 0530  09/12/15 0335 09/12/15 1730 09/13/15 0430 09/13/15 1605 09/14/15 0541 09/14/15 0627  NA 154*  < > 148*  < > 142 139 141 141 140 140  K 3.5  < > 4.0  < > 3.8 3.5 3.8 3.9 4.2 4.2  CL 113*  < > 105  < > 87* 88* 95* 99* 100* 99*  CO2 25  < > 28  < > 32 32 '29 26 26 27  '$ GLUCOSE 128*  < > 140*  < > 139* 145* 142* 134* 135* 133*  BUN 64*  < > 108*  < > 135* 167* 109* 75* 61* 62*  CREATININE 1.36*  < > 2.55*  < > 3.60* 3.86* 2.41* 1.59* 1.27* 1.30*  CALCIUM 8.3*  < > 8.9  <  > 8.4* 8.2* 8.8* 9.0 9.2 9.3  MG 2.9*  --  3.1*  --  3.0*  --  2.8*  --   --  3.0*  PHOS 3.4  --  4.4  --  9.2* 9.0* 5.8* 4.6 4.0  --   < > = values in this interval not displayed. GFR Estimated Creatinine Clearance: 67.5 mL/min (by C-G formula based on Cr of 1.3). Liver Function Tests:  Recent Labs Lab 09/08/15 0400 09/09/15 0530 09/11/15 0420  09/13/15 0430 09/13/15 1040 09/13/15 1605 09/14/15 0541 09/14/15 0627  AST 140* 122* 224*  --   --  233*  --   --  248*  ALT 24 24 46  --   --  59*  --   --  73*  ALKPHOS 128* 134* 228*  --   --  405*  --   --  467*  BILITOT 1.8* 1.4* 1.1  --   --  2.7*  --   --  2.1*  PROT 6.1* 5.8* 6.0*  --   --  6.5  --   --  6.8  ALBUMIN 1.8*  1.7* 2.0*  < > 2.0* 2.0* 2.1* 2.0* 2.0*  < > = values in this interval not displayed. No results for input(s): LIPASE, AMYLASE in the last 168 hours.  Recent Labs Lab 09/13/15 1040 09/14/15 0541  AMMONIA 34 37*   Coagulation profile No results for input(s): INR, PROTIME in the last 168 hours.  CBC:  Recent Labs Lab 09/09/15 0530 09/10/15 0530 09/12/15 0335 09/13/15 0430 09/14/15 0627  WBC 24.0* 18.5* 20.9* 14.8* 13.0*  HGB 8.7* 8.3* 7.4* 7.6* 7.5*  HCT 28.6* 27.9* 23.9* 24.1* 23.8*  MCV 88.5 90.6 89.2 85.5 85.3  PLT 287 290 283 280 280   Cardiac Enzymes:  Recent Labs Lab 09/07/15 1123 09/07/15 1750  TROPONINI 0.09* 0.09*   BNP: Invalid input(s): POCBNP CBG:  Recent Labs Lab 09/13/15 1633 09/13/15 2038 09/13/15 2358 09/14/15 0434 09/14/15 0802  GLUCAP 121* 152* 146* 119* 137*   D-Dimer No results for input(s): DDIMER in the last 72 hours. Hgb A1c No results for input(s): HGBA1C in the last 72 hours. Lipid Profile No results for input(s): CHOL, HDL, LDLCALC, TRIG, CHOLHDL, LDLDIRECT in the last 72 hours. Thyroid function studies No results for input(s): TSH, T4TOTAL, T3FREE, THYROIDAB in the last 72 hours.  Invalid input(s): FREET3 Anemia work up No results for  input(s): VITAMINB12, FOLATE, FERRITIN, TIBC, IRON, RETICCTPCT in the last 72 hours. Microbiology Recent Results (from the past 240 hour(s))  Culture, respiratory (NON-Expectorated)     Status: None   Collection Time: 09/06/15 11:55 AM  Result Value Ref Range Status   Specimen Description TRACHEAL ASPIRATE  Final   Special Requests NONE  Final   Gram Stain   Final    MODERATE WBC PRESENT, PREDOMINANTLY PMN RARE SQUAMOUS EPITHELIAL CELLS PRESENT RARE YEAST Performed at [facility]    Culture   Final    MODERATE YEAST CONSISTENT WITH CANDIDA SPECIES Performed at [facility]    Report Status 09/09/2015 FINAL  Final  Culture, blood (Routine X 2) w Reflex to ID Panel     Status: None   Collection Time: 09/06/15 12:09 PM  Result Value Ref Range Status   Specimen Description BLOOD LEFT HAND  Final   Special Requests BOTTLES DRAWN AEROBIC AND ANAEROBIC [place]  Final   Culture   Final    NO GROWTH 5 DAYS Performed at [facility]    Report Status 09/11/2015 FINAL  Final  Culture, blood (Routine X 2) w Reflex to ID Panel     Status: None   Collection Time: 09/06/15 12:09 PM  Result Value Ref Range Status   Specimen Description BLOOD LEFT HAND  Final   Special Requests BOTTLES DRAWN AEROBIC AND ANAEROBIC [place]  Final   Culture   Final    NO GROWTH 5 DAYS Performed at [facility]    Report Status 09/11/2015 FINAL  Final  Culture, bal-quantitative     Status: None   Collection Time: 09/07/15  4:00 PM  Result Value Ref Range Status   Specimen Description BRONCHIAL WASHINGS RIGHT  Final   Special Requests NONE  Final   Gram Stain   Final    RARE WBC PRESENT, PREDOMINANTLY MONONUCLEAR NO SQUAMOUS EPITHELIAL CELLS SEEN NO ORGANISMS SEEN Performed at [place]   Final    5,000 COLONIES/ML Performed at [place] Performed at [facility]  Report Status 09/09/2015 FINAL  Final  Fungus Culture with Smear     Status: None (Preliminary result)   Collection Time: 09/07/15  4:00 PM  Result Value Ref Range Status   Specimen Description BRONCHIAL WASHINGS RIGHT  Final   Special Requests NONE  Final   Fungal Smear   Final    NO YEAST OR FUNGAL ELEMENTS SEEN Performed at [facility]    Culture   Final    CANDIDA ALBICANS Performed at [facility]    Report Status PENDING  Incomplete      Studies:  Dg Abd 1 View  09/13/2015  CLINICAL DATA:  NG tube placement. EXAM: ABDOMEN - 1 VIEW COMPARISON:  None. FINDINGS: 1835 hours. NG tube tip is positioned in the mid to distal stomach. Patchy airspace disease is seen in the lower lungs bilaterally. IMPRESSION: NG tube tip is in the mid to distal stomach. Electronically Signed   By: Misty Stanley M.D.   On: 09/13/2015 19:02   Dg Chest Port 1 View  09/14/2015  CLINICAL DATA:  Small cell lung cancer EXAM: PORTABLE CHEST 1 VIEW COMPARISON:  09/12/2015 FINDINGS: Left central line, endotracheal tube and NG tube remain in place, unchanged. Dense consolidation in the right lower lobe compatible with pneumonia. Metastatic disease throughout both lungs, unchanged. Heart is borderline in size. Possible small right effusion. IMPRESSION: No significant change since prior study. Electronically Signed   By: Rolm Baptise M.D.   On: 09/14/2015 07:05   Dg Abd Portable 1v  09/14/2015  CLINICAL DATA:  Abdominal distension EXAM: PORTABLE ABDOMEN - 1 VIEW COMPARISON:  09/13/2015 FINDINGS: Scattered large and small bowel gas is noted. No obstructive changes are seen. A nasogastric catheter remains in the stomach. A left femoral line is seen. No acute bony abnormality is seen. Patchy changes are noted in the right lung base stable from previous exams. IMPRESSION: No acute intra-abdominal abnormality noted. Electronically Signed   By: Inez Catalina M.D.   On: 09/14/2015 09:15    Assessment: 40 y.o.  [place] woman with stage IV neuroendocrine carcinoma involving lungs, pelvis and lymph nodes, complicated by respiratory failure requiring intubation, renal failure requiring CRRT, DVT on hepatin, fever and leukocytosis on antibiotics  (1) metastatic carcinoma: likely neuroendocrine, today is day 5 cycle 1 carboplatin/ etoposide, with only day 1 treatment given secondary to patient's other comorbidities; if patient survives next cycle would start in 16 days-- no neutropenia or thrombocytopenia at this point  (2) acute renal failure: multifactorial, on CRRT w improved numbers and pulled 5L yesterday-- renal US 09/06/2015 showed nu hydronephrosis--greatly appreciate renal's help!  (3) respiratory failure: requiring mechanical ventilation, critical care managing  (4) fever, leukocytosis: BAL showing candida-- will ask pharmacy to dose diflucan  (5) mental status changes: likely multifacotria: ammonia level 37, not likely a major contributor; negative non-contarst head CT; neurology evaluation in process  (6) advanced directives: DNR in place  Plan:  See yesterday's note for family discussion. Unfortunately husband not present when I rounded this AM  LDH was very high yesterday, lower today. Could represent effect of chemo killing cells, or simply increased turnover in cancer cells (which frequently outgrow their blood supply). It would be favorable if LDH trends down. Discussed w RN so she can discuss w husband, who is following labs closely  Ammonia level would not explain patient's encephalopathy. Neuro consult has been requested and EEG done, interpretation pending.  Patient appears stable today thanks to meticulous care of ICU/critical care/RT/nephrology/pharmacy  staff. However she is not better from a pulmonary or CNS point of view. Plan is continuing support in hopes of response. DNR is appropriately in place.  Chauncey Cruel, MD 09/14/2015  9:20 AM Medical Oncology and  Hematology [facility] [address] [place], [place] [address] Tel. [phone]    Fax. [phone]

## 2015-09-14 NOTE — Progress Notes (Signed)
At 1935 CRRT machine alarmed with warning that filter was clotting. CRRT was stopped at that time. The patient was disconnected from the machine, the blood was NOT returned to the patient. The HD catheter lines were flushed with normal saline and clamped. The filter was changed and CRRT was resumed at 2028. A total of 20c of normal saline was used to flush the HD catheter input and output sites while the machine was not running. The patients blood pressure did decrease during the filter change and an order for a 500cc normal saline bolus was received from Dr. Melvyn Novas to help replace the fluid lost during the filter change. The patients blood pressure has responded appropriately to the fluid bolus. Will continue to monitor CRRT and blood pressure. Luther Parody, RN

## 2015-09-14 NOTE — Progress Notes (Signed)
[facility] Progress Note   Subjective: no problems with CRRT.  BP has dropped now and UF is off appropriately  Filed Vitals:   09/14/15 1815 09/14/15 1830 09/14/15 1845 09/14/15 1900  BP: 1'00/42 94/40 94/46 '$   Pulse: 128 125 131   Temp:    100.4 F (38 C)  TempSrc:    Axillary  Resp: 34 27 31   Height:      Weight:      SpO2: 94% 89% 93%     Inpatient medications: . antiseptic oral rinse  7 mL Mouth Rinse BID  . antiseptic oral rinse  7 mL Mouth Rinse QID  . budesonide (PULMICORT) nebulizer solution  0.5 mg Nebulization BID  . cefTAZidime (FORTAZ)  IV  2 g Intravenous Q12H  . chlorhexidine gluconate  15 mL Mouth Rinse BID  . fluconazole (DIFLUCAN) IV  400 mg Intravenous Q24H  . levalbuterol  0.63 mg Nebulization Q6H  . pantoprazole sodium  40 mg Per Tube Daily  . sodium chloride  10-40 mL Intracatheter Q12H  . sodium chloride  3 mL Intravenous Q12H   . feeding supplement (NEPRO CARB STEADY) 1,000 mL (09/13/15 1848)  . heparin 1,300 Units/hr (09/14/15 1900)  . dialysis replacement fluid (prismasate) 400 mL/hr at 09/14/15 1913  . dialysis replacement fluid (prismasate) 200 mL/hr at 09/13/15 1920  . dialysate (PRISMASATE) 2,000 mL/hr at 09/14/15 1831   alteplase, alteplase, artificial tears, heparin, heparin lock flush, heparin lock flush, hydrALAZINE, levalbuterol, loperamide, midazolam, morphine injection, [DISCONTINUED] ondansetron **OR** ondansetron (ZOFRAN) IV, polyvinyl alcohol, simethicone, sodium chloride, sodium chloride, sodium chloride flush, sodium chloride flush  Exam: Obtunded , on vent , unresponsive No jvd Chest coarse BS bilat RRR no mrg Abd soft mod distended no mass GU foley in place Ext diffuse 2-3+ ext edema x 4 Neuro not responding   CXR RML consolidation, scattered nodular changes UA 1/21 > protein neg, 0-5 rbc/wbc's   Assessment: 1 Acute renal failure - suspect ATN, oliguric. CRRT day #3. 2 Vol excess up about 16kg 3 Anemia  Hb 7.4 4 Metastatic cancer, neuroendocrine type-  SP chemo x 1 on 1/25 5 Resp failure - PNA/ nodular mets, + vol excess. FiO2 down 0.50,. CXR better w vol removal  Plan - cont CRRT, keeping even now due to BP 90's, resume when BP better   Kelly Splinter MD [facility] pager [phone]    cell [phone] 09/14/2015, 7:34 PM    Recent Labs Lab 09/13/15 1605 09/14/15 0541 09/14/15 0627 09/14/15 1610  NA 141 140 140 135  K 3.9 4.2 4.2 3.9  CL 99* 100* 99* 98*  CO2 '26 26 27 24  '$ GLUCOSE 134* 135* 133* 169*  BUN 75* 61* 62* 56*  CREATININE 1.59* 1.27* 1.30* 1.13*  CALCIUM 9.0 9.2 9.3 8.7*  PHOS 4.6 4.0  --  3.5    Recent Labs Lab 09/11/15 0420  09/13/15 1040  09/14/15 0541 09/14/15 0627 09/14/15 1610  AST 224*  --  233*  --   --  248*  --   ALT 46  --  59*  --   --  73*  --   ALKPHOS 228*  --  405*  --   --  467*  --   BILITOT 1.1  --  2.7*  --   --  2.1*  --   PROT 6.0*  --  6.5  --   --  6.8  --   ALBUMIN 2.0*  < > 2.0*  < >  2.0* 2.0* 2.0*  < > = values in this interval not displayed.  Recent Labs Lab 09/12/15 0335 09/13/15 0430 09/14/15 0627  WBC 20.9* 14.8* 13.0*  HGB 7.4* 7.6* 7.5*  HCT 23.9* 24.1* 23.8*  MCV 89.2 85.5 85.3  PLT 283 280 280

## 2015-09-14 NOTE — Progress Notes (Signed)
Patients blood pressure has remained improved since NS bolus. SBP >100 at this time. CRRT machine changed to begin fluid removal starting at a rate of 15m/h. Will monitor closely. SLuther Parody RN

## 2015-09-14 NOTE — Progress Notes (Signed)
PCCM PROGRESS NOTE  ADMISSION DATE: 08/28/2015 CONSULT DATE: 08/30/2015 REFERRING PROVIDER: Dr. Roel Cluck, [facility]  CC: Short of breath  SUBJECTIVE: eeg perfromed, neg 5  liters  VITAL SIGNS: BP 104/66 mmHg  Pulse 120  Temp(Src) 97.4 F (36.3 C) (Axillary)  Resp 28  Ht 5' 5.5" (1.664 m)  Wt 98.5 kg (217 lb 2.5 oz)  BMI 35.57 kg/m2  SpO2 98%  LMP 09/10/2015  INTAKE/OUTPUT: I/O last 3 completed shifts: In: 2783.5 [I.V.:501; NG/GT:1932.5; IV Piggyback:350] Out: 8144 [Urine:156; YJEHU:3149; Stool:925]  General: not responsive, according to family has been a week HEENT: ETT, No JVD Cardiac: s1 s2 regular, No MRG Chest: coarse BS, ronchi Abd: soft exam. + BS, no r/g, abdo wall edema Ext: 3+ edema lowers, uppers 2 plus Neuro: slight upper ext movement to sternal rub, perr 4-5 mm brisk (rn reported she was more arousal in evening) Skin: no rashes  CBC Recent Labs     09/12/15  0335  09/13/15  0430  09/14/15  0627  WBC  20.9*  14.8*  13.0*  HGB  7.4*  7.6*  7.5*  HCT  23.9*  24.1*  23.8*  PLT  283  280  280    Coag's Recent Labs     09/13/15  0430  09/14/15  0627  APTT  55*  52*    BMET Recent Labs     09/13/15  1605  09/14/15  0541  09/14/15  0627  NA  141  140  140  K  3.9  4.2  4.2  CL  99*  100*  99*  CO2  '26  26  27  '$ BUN  75*  61*  62*  CREATININE  1.59*  1.27*  1.30*  GLUCOSE  134*  135*  133*    Electrolytes Recent Labs     09/12/15  0335   09/13/15  0430  09/13/15  1605  09/14/15  0541  09/14/15  0627  CALCIUM  8.4*   < >  8.8*  9.0  9.2  9.3  MG  3.0*   --   2.8*   --    --   3.0*  PHOS  9.2*   < >  5.8*  4.6  4.0   --    < > = values in this interval not displayed.    Sepsis Markers No results for input(s): PROCALCITON, O2SATVEN in the last 72 hours.  Invalid input(s): LACTICACIDVEN  ABG Recent Labs     09/13/15  0844  09/13/15  1209  PHART  7.391  7.409  PCO2ART  46.3*  43.8  PO2ART  64.3*  75.4*    Liver Enzymes Recent  Labs     09/13/15  1040  09/13/15  1605  09/14/15  0541  09/14/15  0627  AST  233*   --    --   248*  ALT  59*   --    --   73*  ALKPHOS  405*   --    --   467*  BILITOT  2.7*   --    --   2.1*  ALBUMIN  2.0*  2.1*  2.0*  2.0*    Cardiac Enzymes No results for input(s): TROPONINI, PROBNP in the last 72 hours.  Glucose Recent Labs     09/13/15  0755  09/13/15  1206  09/13/15  1633  09/13/15  2038  09/13/15  2358  09/14/15  0434  GLUCAP  123*  119*  121*  152*  146*  119*    Imaging Dg Abd 1 View  09/13/2015  CLINICAL DATA:  NG tube placement. EXAM: ABDOMEN - 1 VIEW COMPARISON:  None. FINDINGS: 1835 hours. NG tube tip is positioned in the mid to distal stomach. Patchy airspace disease is seen in the lower lungs bilaterally. IMPRESSION: NG tube tip is in the mid to distal stomach. Electronically Signed   By: Misty Stanley M.D.   On: 09/13/2015 19:02   Dg Chest Port 1 View  09/14/2015  CLINICAL DATA:  Small cell lung cancer EXAM: PORTABLE CHEST 1 VIEW COMPARISON:  09/12/2015 FINDINGS: Left central line, endotracheal tube and NG tube remain in place, unchanged. Dense consolidation in the right lower lobe compatible with pneumonia. Metastatic disease throughout both lungs, unchanged. Heart is borderline in size. Possible small right effusion. IMPRESSION: No significant change since prior study. Electronically Signed   By: Rolm Baptise M.D.   On: 09/14/2015 07:05  worsening aeration   CULTURES: 1/13 Blood >> neg 1/14 Pneumococcal Ag >> negative 1/14 Legionella Ag >> negative 1/15 C diff PCR >> negative 1/22 BAL>>>yeast candida only 5 k BCX2 1/21>>>  ANTIBIOTICS: 1/13 Rocephin >> 1/13 1/13 Zithromax >> 1/13 1/14 Vancomycin >> 1/19, restart 1/21>>> 1/14 Zosyn >> 1/20. 1/21 Ceftaz >>>  LINES/TUBES: 1/15 Rt PICC >> 1/24 Left IJ CVL 1/24>>> HD cath 1/27>>>  STUDIES: 1/13 CT chest >> multiple b/l nodules, mass like consolidation Rt mid lung, Rt hilar and subcarinal mass,  Lt hilar LAN, 2.7 cm Rt paratracheal LAN 1/14 CT abd/pelvis >> 10 cm mass superior to uterus 1/15 Tumor markers >> CA 19-9 453, CEA 3.9, CA 125 780.8 1/16 Bronchoscopy >> crush artifact ?small cell lung cancer (non diagnostic)  EVENTS: 1/13 Admit 1/15 Gyn oncology consulted 1/16 Bronchoscopy in ICU; Placed on BiPAP for wheezing, dyspnea 1/19 IR biopsy>>>Poorly differentiated high Grade Carcinoma.  >stains favor Neuroendocrine. TTF stain was NEGATIVE 1/22 bronch cytology:  1/28 EEG>>>  ASSESSMENT/PLAN:  PULMONARY A: Acute hypoxic respiratory failure 2nd to post-obstructive pneumonia, metastatic cancer, and wheeze from extrinsic airway compression. Probably element of PE improved O2 requirements and CXR looking worse.  Plat elevated 1/28 (noncomopliance) P: -hep drip -cvvhd to neg balance successful -last abg reviewed, keep same mV -pcxr maybe some slight better aeration after 5 liters neg -keep lower TV to keep plat less 30, requires rate 24 -goal to peep 12 if able  CARDIAC A: Persistent  sinus tachycardia P: - tele -treat pain, I will discuss with husband  RENAL A: AKI w/ worsening Uremia  Volume OverLoad   P: Cvvhd, neg balance goals obtained Chem in am This degree of uremia would NOT (by itself) cause mental status changes- wil update husband  GASTROENTEROLOGY A: Nutrition, cdiff neg, loose stools, abdo distention P: - Tube feeds - Protonix for SUP -immodium -get xray abdo  HEMATOLOGY/ONCOLOGY A: Poorly differentiated high Grade Carcinoma. Advanced & treating as Small Cell   >stains favor Neuroendocrine--Emergent chemotherapy completed on 1/25 Anemia of critical illness. DVT: left peroneal vein and Right UE extending into the Subclavian -->picc removed.  Anemia despite 5 liters neg balance Did poorly with chemo P: Heparin gtt for PE/DVT No chemo x 16 days Cbc follow up  INFECTION A: Sepsis 2nd to post-obstructive pneumonia. Fevers resolved  and elevated LA are likely secondary to tumor P: - maintain current abx, fever better after start  ENDOCRINE A: No acute issues. P: - monitor blood glucose on BMET  NEUROLOGY A: Cancer pain.  Acute Encephalopathy. Suspect that this is r/t her AKI and uremia  R/o subclinical seizures, mets P: - fentanyl for pain is needed -eeg result pending  Ccm time 30 min   i updated husband   Lavon Paganini. Titus Mould, MD, [place] Pgr: [facility]

## 2015-09-14 NOTE — Progress Notes (Signed)
Pt's brother and father from [place] were bedside today w/husband. Pt's husband said she opened her eyes and yawned today but admitted he is concerned about b/c her pressure has dropped. CH offered on-going spt, comfort, and encouragement. Family appreciative of visit.  Marcus, M.Div \   09/14/15 1800  Clinical Encounter Type  Visited With Family

## 2015-09-14 NOTE — Progress Notes (Signed)
Patient has a left femoral HD catheter that was placed on 09/12/15.  The HD catheter was never placed in the patient's chart. I placed the information in the chart today, 09/14/15. I charted the catheter and it's insertion information based on Pete Babcock's note on 09/12/15 at 1547.

## 2015-09-14 NOTE — Progress Notes (Signed)
[facility] Progress Note Patient Name: Monique [name] DOB: 1976/04/29 MRN: [id number]   Date of Service  09/14/2015  HPI/Events of Note  Fm requesting change from fentanyl to MS   eICU Interventions  Ok to try ms 1-4 mg IV q  2 h prn      Intervention Category Minor Interventions: Routine modifications to care plan (e.g. PRN medications for pain, fever)  Christinia Gully 09/14/2015, 5:34 PM

## 2015-09-14 NOTE — Progress Notes (Signed)
ANTICOAGULATION CONSULT NOTE - Follow up  Pharmacy Consult for heparin IV  Indication: DVT noted in the subclavian vein and axillary vein.  No Known Allergies  Patient Measurements: Height: 5' 5.5" (166.4 cm) Weight: 217 lb 2.5 oz (98.5 kg) IBW/kg (Calculated) : 58.15 Heparin Dosing Weight: 76.8 kg  Vital Signs: Temp: 97.4 F (36.3 C) (01/29 0410) Temp Source: Axillary (01/29 0410) BP: 104/66 mmHg (01/29 0711) Pulse Rate: 120 (01/29 0711)  Labs:  Recent Labs  09/12/15 0335  09/13/15 0430 09/13/15 1605 09/14/15 0541 09/14/15 0627  HGB 7.4*  --  7.6*  --   --  7.5*  HCT 23.9*  --  24.1*  --   --  23.8*  PLT 283  --  280  --   --  280  APTT  --   --  55*  --   --  52*  HEPARINUNFRC 0.42  --  0.41  --   --  0.34  CREATININE 3.60*  < > 2.41* 1.59* 1.27* 1.30*  < > = values in this interval not displayed.  Estimated Creatinine Clearance: 67.5 mL/min (by C-G formula based on Cr of 1.3).   Medical History: History reviewed. No pertinent past medical history.  Medications:  Scheduled:  . antiseptic oral rinse  7 mL Mouth Rinse BID  . antiseptic oral rinse  7 mL Mouth Rinse QID  . budesonide (PULMICORT) nebulizer solution  0.5 mg Nebulization BID  . cefTAZidime (FORTAZ)  IV  2 g Intravenous Q12H  . chlorhexidine gluconate  15 mL Mouth Rinse BID  . fluconazole (DIFLUCAN) IV  400 mg Intravenous Q24H  . levalbuterol  0.63 mg Nebulization Q6H  . pantoprazole sodium  40 mg Per Tube Daily  . sodium chloride  10-40 mL Intracatheter Q12H  . sodium chloride  3 mL Intravenous Q12H    Assessment: Pt is a 40 yo female diagnosed with DVT noted in the subclavian vein and axillary vein. Pt is currently intubated with acute hypoxic respiratory failure secondary to post-obstructive pneumonia, metastatic cancer, and wheeze from extrinsic airway compression. Hgb has been trending down recently, will monitor closely.   Today, 09/14/2015:  Heparin level remains in the target range on  1300units/hr.  H/H and platelets are stable. No bleeding reported/documented.  CRRT initiated on 1/27   Goal of Therapy:  Heparin level 0.3-0.7 units/ml Monitor platelets by anticoagulation protocol: Yes   Plan:  Cont heparin infusion at 1300 units/hr. Heparin level daily. Check CBC q24h while on heparin. F/u daily.  Dolly Rias RPh 09/14/2015, 7:41 AM Pager [phone]

## 2015-09-15 ENCOUNTER — Inpatient Hospital Stay (HOSPITAL_COMMUNITY): Payer: BC Managed Care – PPO

## 2015-09-15 DIAGNOSIS — K567 Ileus, unspecified: Secondary | ICD-10-CM | POA: Insufficient documentation

## 2015-09-15 DIAGNOSIS — C3490 Malignant neoplasm of unspecified part of unspecified bronchus or lung: Secondary | ICD-10-CM | POA: Insufficient documentation

## 2015-09-15 DIAGNOSIS — C3491 Malignant neoplasm of unspecified part of right bronchus or lung: Secondary | ICD-10-CM

## 2015-09-15 LAB — HEPATIC FUNCTION PANEL
ALT: 84 U/L — ABNORMAL HIGH (ref 14–54)
AST: 214 U/L — AB (ref 15–41)
Albumin: 1.8 g/dL — ABNORMAL LOW (ref 3.5–5.0)
Alkaline Phosphatase: 460 U/L — ABNORMAL HIGH (ref 38–126)
BILIRUBIN DIRECT: 0.9 mg/dL — AB (ref 0.1–0.5)
BILIRUBIN INDIRECT: 0.9 mg/dL (ref 0.3–0.9)
BILIRUBIN TOTAL: 1.8 mg/dL — AB (ref 0.3–1.2)
Total Protein: 6.1 g/dL — ABNORMAL LOW (ref 6.5–8.1)

## 2015-09-15 LAB — RENAL FUNCTION PANEL
ALBUMIN: 1.9 g/dL — AB (ref 3.5–5.0)
Albumin: 1.9 g/dL — ABNORMAL LOW (ref 3.5–5.0)
Anion gap: 11 (ref 5–15)
Anion gap: 12 (ref 5–15)
BUN: 49 mg/dL — AB (ref 6–20)
BUN: 45 mg/dL — AB (ref 6–20)
CALCIUM: 8.6 mg/dL — AB (ref 8.9–10.3)
CALCIUM: 9.2 mg/dL (ref 8.9–10.3)
CHLORIDE: 101 mmol/L (ref 101–111)
CO2: 25 mmol/L (ref 22–32)
CO2: 27 mmol/L (ref 22–32)
CREATININE: 0.89 mg/dL (ref 0.44–1.00)
CREATININE: 0.82 mg/dL (ref 0.44–1.00)
Chloride: 102 mmol/L (ref 101–111)
GFR calc Af Amer: >60 mL/min (ref >60)
GFR calc non Af Amer: >60 mL/min (ref >60)
Glucose, Bld: 156 mg/dL — ABNORMAL HIGH (ref 65–99)
Glucose, Bld: 163 mg/dL — ABNORMAL HIGH (ref 65–99)
PHOSPHORUS: 3.4 mg/dL (ref 2.5–4.6)
Phosphorus: 3 mg/dL (ref 2.5–4.6)
Potassium: 3.9 mmol/L (ref 3.5–5.1)
Potassium: 3.9 mmol/L (ref 3.5–5.1)
SODIUM: 138 mmol/L (ref 135–145)
SODIUM: 140 mmol/L (ref 135–145)

## 2015-09-15 LAB — GLUCOSE, CAPILLARY
GLUCOSE-CAPILLARY: 164 mg/dL — AB (ref 65–99)
GLUCOSE-CAPILLARY: 144 mg/dL — AB (ref 65–99)
GLUCOSE-CAPILLARY: 134 mg/dL — AB (ref 65–99)
Glucose-Capillary: 141 mg/dL — ABNORMAL HIGH (ref 65–99)
Glucose-Capillary: 146 mg/dL — ABNORMAL HIGH (ref 65–99)
Glucose-Capillary: 147 mg/dL — ABNORMAL HIGH (ref 65–99)
Glucose-Capillary: 147 mg/dL — ABNORMAL HIGH (ref 65–99)
Glucose-Capillary: 153 mg/dL — ABNORMAL HIGH (ref 65–99)

## 2015-09-15 LAB — AMMONIA: Ammonia: 34 umol/L (ref 9–35)

## 2015-09-15 LAB — CBC WITH DIFFERENTIAL/PLATELET
BASOS ABS: 0 10*3/uL (ref 0.0–0.1)
Basophils Relative: 0 %
EOS ABS: 0 10*3/uL (ref 0.0–0.7)
Eosinophils Relative: 0 %
HCT: 21.2 % — ABNORMAL LOW (ref 36.0–46.0)
Hemoglobin: 6.7 g/dL — CL (ref 12.0–15.0)
LYMPHS PCT: 3 %
Lymphs Abs: 0.3 10*3/uL — ABNORMAL LOW (ref 0.7–4.0)
MCH: 27 pg (ref 26.0–34.0)
MCHC: 31.6 g/dL (ref 30.0–36.0)
MCV: 85.5 fL (ref 78.0–100.0)
MONO ABS: 0 10*3/uL — AB (ref 0.1–1.0)
Monocytes Relative: 0 %
Neutro Abs: 8.2 10*3/uL — ABNORMAL HIGH (ref 1.7–7.7)
Neutrophils Relative %: 96 %
PLATELETS: 224 10*3/uL (ref 150–400)
RBC: 2.48 MIL/uL — AB (ref 3.87–5.11)
RDW: 14.6 % (ref 11.5–15.5)
WBC: 8.5 10*3/uL (ref 4.0–10.5)

## 2015-09-15 LAB — MAGNESIUM: MAGNESIUM: 2.7 mg/dL — AB (ref 1.7–2.4)

## 2015-09-15 LAB — PREPARE RBC (CROSSMATCH)

## 2015-09-15 LAB — LACTATE DEHYDROGENASE: LDH: 571 U/L — AB (ref 98–192)

## 2015-09-15 LAB — ABO/RH: ABO/RH(D): O POS

## 2015-09-15 LAB — HEPARIN LEVEL (UNFRACTIONATED)
HEPARIN UNFRACTIONATED: 0.31 [IU]/mL (ref 0.30–0.70)
HEPARIN UNFRACTIONATED: 0.3 [IU]/mL (ref 0.30–0.70)

## 2015-09-15 LAB — APTT: APTT: 58 s — AB (ref 24–37)

## 2015-09-15 MED ORDER — HEPARIN (PORCINE) IN NACL 100-0.45 UNIT/ML-% IJ SOLN
1450.0000 [IU]/h | INTRAMUSCULAR | Status: DC
  Administered 2015-09-16: 1450 [IU]/h via INTRAVENOUS
  Filled 2015-09-15 (×2): qty 250

## 2015-09-15 MED ORDER — HEPARIN (PORCINE) IN NACL 100-0.45 UNIT/ML-% IJ SOLN
1350.0000 [IU]/h | INTRAMUSCULAR | Status: DC
  Administered 2015-09-15: 1350 [IU]/h via INTRAVENOUS
  Filled 2015-09-15: qty 250

## 2015-09-15 MED ORDER — SODIUM CHLORIDE 0.9 % IV SOLN
Freq: Once | INTRAVENOUS | Status: AC
  Administered 2015-09-15: 08:00:00 via INTRAVENOUS

## 2015-09-15 MED ORDER — PRO-STAT SUGAR FREE PO LIQD
30.0000 mL | Freq: Two times a day (BID) | ORAL | Status: DC
  Administered 2015-09-15 – 2015-09-17 (×6): 30 mL
  Filled 2015-09-15 (×6): qty 30

## 2015-09-15 MED ORDER — NEPRO/CARBSTEADY PO LIQD
1000.0000 mL | ORAL | Status: DC
  Administered 2015-09-16 – 2015-09-17 (×2): 1000 mL via ORAL
  Filled 2015-09-15 (×4): qty 1000

## 2015-09-15 NOTE — Progress Notes (Signed)
IP PROGRESS NOTE  Subjective:   She remains on the ventilator. She is now alert.  Objective: Vital signs in last 24 hours: Blood pressure 105/70, pulse 101, temperature 96.1 F (35.6 C), temperature source Axillary, resp. rate 24, height 5' 5.5" (1.664 m), weight 217 lb 2.5 oz (98.5 kg), last menstrual period 09/10/2015, SpO2 100 %.  Intake/Output from previous day: 01/29 0701 - 01/30 0700 In: 2571 [I.V.:911; NG/GT:1360; IV Piggyback:300] Out: 4871 [Urine:16; Stool:500]  Physical Exam:  HEENT: ET tube in place Lungs: Loud upper airway sounds bilaterally. Cardiac: Distant breath sounds Abdomen: Mildly distended Extremities: No edema Neurologic: Opens eyes, follows commands, moves all extremities to command, denies pain  Left IJ catheter site without erythema  Lab Results:  Recent Labs  09/14/15 0627 09/15/15 0510  WBC 13.0* 8.5  HGB 7.5* 6.7*  HCT 23.8* 21.2*  PLT 280 224    BMET  Recent Labs  09/14/15 1610 09/15/15 0510  NA 135 138  K 3.9 3.9  CL 98* 102  CO2 24 25  GLUCOSE 169* 156*  BUN 56* 49*  CREATININE 1.13* 0.89  CALCIUM 8.7* 8.6*    Studies/Results: Dg Abd 1 View  09/13/2015  CLINICAL DATA:  NG tube placement. EXAM: ABDOMEN - 1 VIEW COMPARISON:  None. FINDINGS: 1835 hours. NG tube tip is positioned in the mid to distal stomach. Patchy airspace disease is seen in the lower lungs bilaterally. IMPRESSION: NG tube tip is in the mid to distal stomach. Electronically Signed   By: Misty Stanley M.D.   On: 09/13/2015 19:02   Dg Chest Port 1 View  09/15/2015  CLINICAL DATA:  Respiratory failure. EXAM: PORTABLE CHEST 1 VIEW COMPARISON:  09/14/2015.  CT 08/17/2015. FINDINGS: Endotracheal tube, left IJ line, NG tube in stable position. Persistent dense right lower lobe infiltrate. Persistent bilateral pulmonary masses consistent with metastatic disease. Persistent small right pleural effusion. No interim change. Heart size stable. IMPRESSION: 1. Lines and tubes  in  stable position. 2. Persistent right lower lobe dense infiltrate consistent with pneumonia. 3. Persistent bilateral pulmonary masses consistent with metastatic disease. Stable small right pleural effusion. Chest is unchanged from prior exam. Electronically Signed   By: Marcello Moores  Register   On: 09/15/2015 07:06   Dg Chest Port 1 View  09/14/2015  CLINICAL DATA:  Small cell lung cancer EXAM: PORTABLE CHEST 1 VIEW COMPARISON:  09/12/2015 FINDINGS: Left central line, endotracheal tube and NG tube remain in place, unchanged. Dense consolidation in the right lower lobe compatible with pneumonia. Metastatic disease throughout both lungs, unchanged. Heart is borderline in size. Possible small right effusion. IMPRESSION: No significant change since prior study. Electronically Signed   By: Rolm Baptise M.D.   On: 09/14/2015 07:05   Dg Abd Portable 1v  09/14/2015  CLINICAL DATA:  Abdominal distension EXAM: PORTABLE ABDOMEN - 1 VIEW COMPARISON:  09/13/2015 FINDINGS: Scattered large and small bowel gas is noted. No obstructive changes are seen. A nasogastric catheter remains in the stomach. A left femoral line is seen. No acute bony abnormality is seen. Patchy changes are noted in the right lung base stable from previous exams. IMPRESSION: No acute intra-abdominal abnormality noted. Electronically Signed   By: Inez Catalina M.D.   On: 09/14/2015 09:15    Medications: I have reviewed the patient's current medications.  Assessment/Plan:  1. Metastatic carcinoma with a dominant right lung mass, bilateral lung masses, chest/abdominal lymphadenopathy, and a pelvic mass  Bronchoscopy 09/01/2015 revealed endobronchial lesions at the right upper lobe and  right middle lobe, biopsies revealed malignant cells without a definitive diagnosis  CT-guided biopsy of a respiratory lymph node 09/04/2015 confirmed poorly differential carcinoma, with some features of a neuroendocrine carcinoma.  Cycle 1 etoposide/carboplatin  09/10/2015  2. Respiratory failure secondary to #1 and pneumonia  3. Renal failure-maintained on CRRT day #4  4. Left leg and right upper extremity DVTs 09/09/2015  5.  Altered mental status secondary to uremia and respiratory failure-improved  6.  Anemia secondary to phlebotomy, metastatic carcinoma, and renal failure, receiving a red cell transfusion today   Her mentation is significantly improved today. She is now at day #6 following treatment with etoposide/carboplatin. She continues to require ventilator support for respiratory failure and CRRT for renal failure/volume overload.  Recommendations: 1. Continue management of respiratory failure per critical care medicine 2. CRRT per nephrology 3. Radiation oncology consult-Dr. Tammi Klippel if critical care medicine continues to feel radiation may help decrease the ventilator requirement 4. Plan for a second cycle of etoposide/carboplatin if the respiratory failure and renal failure improves    LOS: 16 days   [place]  09/15/2015, 8:58 AM

## 2015-09-15 NOTE — Procedures (Signed)
History: 40 yo F with altered mental status in teh setting of acute illness  Sedation: midazolam intermittent injection  Technique: This is a 21 channel routine scalp EEG performed at the bedside with bipolar and monopolar montages arranged in accordance to the international 10/20 system of electrode placement. One channel was dedicated to EKG recording.    Background: There is prominent high voltage disorganized delta and theta activity. There eis a poorly sustained, poorly organized PDR that does achieve a maximal frequency of 8 Hz. There was no clear sleep recorded.   Photic stimulation: Physiologic driving is not performed  EEG Abnormalities: 1) Generalized irregualr slow activity 2) Poorly organized PDR  Clinical Interpretation: This EEG is consistent with a generalized non-specific cerebral dysfunction(encephalopathy). There was no seizure or seizure predisposition recorded on this study. Please note that a normal EEG does not preclude the possibility of epilepsy.   Roland Rack, MD [facility] [phone]  If 7pm- 7am, please page neurology on call as listed in [place].

## 2015-09-15 NOTE — Progress Notes (Signed)
[facility] Progress Note   Subjective: no problems with CRRT.  CVP high - trying to remove 150-250 per hour  Filed Vitals:   09/15/15 0930 09/15/15 1000 09/15/15 1024 09/15/15 1030  BP: 111/70 119/74 125/73 125/73  Pulse: 99 95 94 93  Temp:   96.1 F (35.6 C)   TempSrc:   Axillary   Resp: '25 24 19 19  '$ Height:      Weight:      SpO2: 98% 98% 96% 96%    Inpatient medications: . antiseptic oral rinse  7 mL Mouth Rinse BID  . antiseptic oral rinse  7 mL Mouth Rinse QID  . budesonide (PULMICORT) nebulizer solution  0.5 mg Nebulization BID  . cefTAZidime (FORTAZ)  IV  2 g Intravenous Q12H  . chlorhexidine gluconate  15 mL Mouth Rinse BID  . fluconazole (DIFLUCAN) IV  400 mg Intravenous Q24H  . levalbuterol  0.63 mg Nebulization Q6H  . pantoprazole sodium  40 mg Per Tube Daily  . sodium chloride  10-40 mL Intracatheter Q12H  . sodium chloride  3 mL Intravenous Q12H   . feeding supplement (NEPRO CARB STEADY) 1,000 mL (09/15/15 1040)  . heparin 1,350 Units/hr (09/15/15 1058)  . dialysis replacement fluid (prismasate) 400 mL/hr at 09/15/15 1024  . dialysis replacement fluid (prismasate) 200 mL/hr at 09/14/15 2212  . dialysate (PRISMASATE) 2,000 mL/hr at 09/15/15 1056   alteplase, alteplase, artificial tears, heparin, heparin lock flush, heparin lock flush, hydrALAZINE, levalbuterol, loperamide, midazolam, morphine injection, [DISCONTINUED] ondansetron **OR** ondansetron (ZOFRAN) IV, polyvinyl alcohol, simethicone, sodium chloride, sodium chloride, sodium chloride flush, sodium chloride flush  Exam: Obtunded , on vent , unresponsive- followed commands per other notes- video chatting with family/friends in [place] No jvd Chest coarse BS bilat RRR no mrg Abd soft mod distended no mass GU foley in place Ext diffuse 2-3+ ext edema x 4- left femoral cath placed 1/27 Neuro not responding   CXR RML consolidation, scattered nodular changes UA 1/21 > protein neg, 0-5  rbc/wbc's   Assessment: 1 Acute renal failure - suspect ATN, oliguric/anuric. CRRT day #4. Uremia better but still have a ways to go with volume- to continue CRRT for now- no heparin in machine but on heparin drip for DVTs  2 Vol excess up about 16kg- continue to UF as able  3 Anemia Hb 7.4- transfuse as needed 4 Metastatic cancer, neuroendocrine type-  SP chemo x 1 on 1/25- obviously a complicating factor  5 Resp failure - PNA/ nodular mets, + vol excess. FiO2 down 0.50,. CXR better w vol removal 6. [place]- OK - all dialysate    Kyndell Zeiser A   09/15/2015, 11:11 AM    Recent Labs Lab 09/14/15 0541 09/14/15 0627 09/14/15 1610 09/15/15 0510  NA 140 140 135 138  K 4.2 4.2 3.9 3.9  CL 100* 99* 98* 102  CO2 '26 27 24 25  '$ GLUCOSE 135* 133* 169* 156*  BUN 61* 62* 56* 49*  CREATININE 1.27* 1.30* 1.13* 0.89  CALCIUM 9.2 9.3 8.7* 8.6*  PHOS 4.0  --  3.5 3.4    Recent Labs Lab 09/13/15 1040  09/14/15 0627 09/14/15 1610 09/15/15 0510  AST 233*  --  248*  --  214*  ALT 59*  --  73*  --  84*  ALKPHOS 405*  --  467*  --  460*  BILITOT 2.7*  --  2.1*  --  1.8*  PROT 6.5  --  6.8  --  6.1*  ALBUMIN  2.0*  < > 2.0* 2.0* 1.8*  1.9*  < > = values in this interval not displayed.  Recent Labs Lab 09/13/15 0430 09/14/15 0627 09/15/15 0510  WBC 14.8* 13.0* 8.5  NEUTROABS  --   --  8.2*  HGB 7.6* 7.5* 6.7*  HCT 24.1* 23.8* 21.2*  MCV 85.5 85.3 85.5  PLT 280 280 224

## 2015-09-15 NOTE — Progress Notes (Signed)
Nutrition Follow-up  INTERVENTION:   Decrease Nepro to goal of 45 ml/hr. 30 ml Prostat BID This TF regimen provides 2144 kcal (104% of needs), 117g of protein and 785 ml H20.  RD to continue to monitor  NUTRITION DIAGNOSIS:   Inadequate oral intake related to inability to eat as evidenced by NPO status.  Ongoing.  GOAL:   Patient will meet greater than or equal to 90% of their needs  Meeting.  MONITOR:   TF tolerance, Skin, Vent status, Labs, I & O's  ASSESSMENT:   Presented with about the month worsens worsening shortness of breath and wheezing. She has been seen for this by her primary care provider diagnosed with pneumonia based on chest x-ray and completed antibiotics is not improvement repeat chest x-ray showed persistent pneumonia she had a total three courses of antibiotics Including z-pack, levaquin and clarithromycin. Suspect that she has a malignancy of sort, however, it is unclear what the primary is;  Multi-compartmental masses (chest, adenopathy, ovarian).   Patient remains on CRRT, weight for today was unable to be obtained d/t bed. Based on weight from yesterday 1/29, her weight is +35 lb since admission.  Patient currently receiving Nepro @ 50 ml/hr providing 2376 kcal (115% of needs) and 106g protein (88% of needs). RD to adjust order to better meet re-estimated needs.  Patient is currently intubated on ventilator support since 1/19. MV: 12.4 L/min Temp (24hrs), Avg:97.8 F (36.6 C), Min:95.7 F (35.4 C), Max:100.4 F (38 C)  Labs reviewed: Elevated BUN, Mg  Diet Order:  Diet NPO time specified  Skin:  Reviewed, no issues  Last BM:  1/29  Height:   Ht Readings from Last 1 Encounters:  09/13/15 5' 5.5" (1.664 m)    Weight:   Wt Readings from Last 1 Encounters:  09/13/15 217 lb 2.5 oz (98.5 kg)    Ideal Body Weight:  57.95 kg (kg)  BMI:  Body mass index is 35.57 kg/(m^2).  Estimated Nutritional Needs:   Kcal:  2065  Protein:   120-130g  Fluid:  >/= 2 L/day  EDUCATION NEEDS:   No education needs identified at this time  Clayton Bibles, MS, RD, LDN Pager: [phone] After Hours Pager: [phone]

## 2015-09-15 NOTE — Progress Notes (Signed)
Patient opened eyes without any physical stimulus, nodded correctly to yes/no questions, and moved her fingers on command. Patient indicated she was not currently in any pain. Will continue to monitor. S.Cahlil Sattar, RN

## 2015-09-15 NOTE — Progress Notes (Signed)
eLink Physician-Brief Progress Note Patient Name: Monique [name] DOB: Nov 08, 1975 MRN: [id number]   Date of Service  09/15/2015  HPI/Events of Note  Anemia - Hgb = 6.7.   eICU Interventions  Will transfuse 1 unit PRBC when available.      Intervention Category Intermediate Interventions: Other:  Lysle Dingwall 09/15/2015, 5:43 AM

## 2015-09-15 NOTE — Progress Notes (Signed)
ANTICOAGULATION CONSULT NOTE - Follow up  Pharmacy Consult for heparin IV  Indication: DVT noted in the subclavian vein and axillary vein.  No Known Allergies  Patient Measurements: Height: 5' 5.5" (166.4 cm) Weight:  (unable to weigh-bed not working properly) IBW/kg (Calculated) : 58.15 Heparin Dosing Weight: 76.8 kg  Vital Signs: Temp: 96.1 F (35.6 C) (01/30 0824) Temp Source: Axillary (01/30 0824) BP: 105/70 mmHg (01/30 0824) Pulse Rate: 101 (01/30 0824)  Labs:  Recent Labs  09/13/15 0430  09/14/15 0627 09/14/15 1610 09/15/15 0510  HGB 7.6*  --  7.5*  --  6.7*  HCT 24.1*  --  23.8*  --  21.2*  PLT 280  --  280  --  224  APTT 55*  --  52*  --  58*  HEPARINUNFRC 0.41  --  0.34  --  0.31  CREATININE 2.41*  < > 1.30* 1.13* 0.89  < > = values in this interval not displayed.  Estimated Creatinine Clearance: 98.6 mL/min (by C-G formula based on Cr of 0.89).   Medical History: History reviewed. No pertinent past medical history.  Medications:  Scheduled:  . antiseptic oral rinse  7 mL Mouth Rinse BID  . antiseptic oral rinse  7 mL Mouth Rinse QID  . budesonide (PULMICORT) nebulizer solution  0.5 mg Nebulization BID  . cefTAZidime (FORTAZ)  IV  2 g Intravenous Q12H  . chlorhexidine gluconate  15 mL Mouth Rinse BID  . fluconazole (DIFLUCAN) IV  400 mg Intravenous Q24H  . levalbuterol  0.63 mg Nebulization Q6H  . pantoprazole sodium  40 mg Per Tube Daily  . sodium chloride  10-40 mL Intracatheter Q12H  . sodium chloride  3 mL Intravenous Q12H    Assessment: Pt is a 40 yo female diagnosed with DVT noted in the L peroneal vein and RUE at PICC site >> PICC removed. Pt is currently intubated with acute hypoxic respiratory failure secondary to post-obstructive pneumonia, metastatic cancer, and wheeze from extrinsic airway compression.  PCCM thinks probably element of PE as well.  Hgb has been trending down recently, will monitor closely.  Today, 09/15/2015:  Hgb  continues to drop to 6.7 overnight - receiving PRBC x 1  Heparin level now just above lower end of therapeutic range on 1300 units/hr; has been decreasing since initiation  H/H and platelets are stable. No bleeding reported/documented.  CRRT initiated on 1/27    Goal of Therapy:  Heparin level 0.3-0.7 units/ml Monitor platelets by anticoagulation protocol: Yes   Plan:  Will increase IV heparin to 1350 units/hr as I expect heparin level to continue to drop below therapeutic with next level. Will target lower end of threshold given anemia and expected drop in platelets from chemo. Recheck heparin level in 8 hrs Heparin level daily. Check CBC q24h while on heparin. F/u daily.  Reuel Boom, PharmD, BCPS Pager: [phone] 09/15/2015, 8:41 AM

## 2015-09-15 NOTE — Progress Notes (Signed)
   09/15/15 1200  Clinical Encounter Type  Visited With Family  Visit Type Follow-up  Spiritual Encounters  Spiritual Needs Emotional;Other (Comment) (Pastoral Conversation/Support)  Stress Factors  Patient Stress Factors Not reviewed  Family Stress Factors Exhausted   I visited with the patient's husband and a cousin. Spiritual Care is providing the family with continued support.  The husband stated that he was more optimistic today. He said that the patient might be taken off of the vent tomorrow. He did not mention the severity of his wife's condition, and at times it seems that he might be having a difficult time accepting her condition.  Patient's husband requested that I stop by and see his son when I can.   Will continue to follow this case.   [place] M.Div.

## 2015-09-15 NOTE — Progress Notes (Addendum)
  Pharmacy Antibiotic Follow-up Note  Monique [name] is a 40 y.o. year-old female admitted on 09/04/2015.  The patient is on CRRT, currently on day 10 of ceftazidime, and D3 Diflucan.  Assessment/Plan:  Continue abx as ordered given overall improved temps/WBC but fever overnight.  Per PCCM, will complete 10 days of current abx (counting today as 9/10)  Temp (24hrs), Avg:98.1 F (36.7 C), Min:95.7 F (35.4 C), Max:100.4 F (38 C)   Recent Labs Lab 09/10/15 0530 09/12/15 0335 09/13/15 0430 09/14/15 0627 09/15/15 0510  WBC 18.5* 20.9* 14.8* 13.0* 8.5     Recent Labs Lab 09/13/15 1605 09/14/15 0541 09/14/15 0627 09/14/15 1610 09/15/15 0510  CREATININE 1.59* 1.27* 1.30* 1.13* 0.89   Estimated Creatinine Clearance: 98.6 mL/min (by C-G formula based on Cr of 0.89).    No Known Allergies  Antimicrobials this admission: 1/13 >> Rocephin >> 1/13 1/13 >> Zithromax >> 1/13 1/14 >> Vanc >> 1/19, 1/21 >> Vanc >> 1/25 1/14 >> Zosyn >> 1/20 1/21 >> Fortaz >> 1/28 >> diflucan >>  Levels/dose changes this admission: 1/15 1900 VT: 15 on 1g IV q8h 1/18 1130 VT: 15 on 1g IV q8h 1/24 1500 VT: 36 on '750mg'$  IV q8h = hold Vanc. Decrease ceftazidime to 1g IV q12h.  1/25 0500 Vrandom: 28. Ke ~0.017, T1/2 ~41hrs. Vanc dc'd by CCM.  Microbiology Results: 1/14 HIV screen: neg 1/14 S. pneumo UAg: neg 1/14 Legionella UAg: neg 1/14 MRSA PCR: neg 1/15 Cdiff PCR: neg 1/13 blood x2: NGF 1/21 Trach asp: moderate yeast c/w candida, final(insignificant per CCM) 1/21 blood x2: NGF 1/22 bronch washings: 5K colonies/ml, Candida, final(insignificant per CCM)  Today, 09/15/2015: Temp: 100.4 overnight, afebrile yesterday WBC: finally improved to wnl Renal: CRRT; effluent rate = 26 ml/kg/hr CXR with unchanged RLL dense infiltrate and tumor masses   Thank you for allowing pharmacy to be a part of this patient's care.  Reuel Boom, PharmD, BCPS Pager: [phone] 09/15/2015, 9:54 AM

## 2015-09-15 NOTE — Progress Notes (Signed)
PCCM PROGRESS NOTE  ADMISSION DATE: 08/20/2015 CONSULT DATE: 08/30/2015 REFERRING PROVIDER: Dr. Roel Cluck, [facility]  CC: Short of breath  SUBJECTIVE: eeg perfromed, neg 5  liters  VITAL SIGNS: BP 105/70 mmHg  Pulse 101  Temp(Src) 96.1 F (35.6 C) (Axillary)  Resp 24  Ht 5' 5.5" (1.664 m)  Wt 217 lb 2.5 oz (98.5 kg)  BMI 35.57 kg/m2  SpO2 100%  LMP 09/01/2015  INTAKE/OUTPUT: I/O last 3 completed shifts: In: 1224 [I.V.:1067; NG/GT:2020; IV Piggyback:350] Out: 8250 [Urine:16; IBBCW:8889; Stool:800]  General:now awake, follows commands. Responds appropriately  HEENT: ETT, No JVD Cardiac: s1 s2 regular, No MRG Chest: coarse BS, rhonchi w/ diffuse wheeze  Abd: soft exam. + BS, no r/g, abdo wall edema Ext: 3+ edema lowers, uppers 2 plus Neuro: Now awake and follows commands Skin: no rashes  CBC Recent Labs     09/13/15  0430  09/14/15  0627  09/15/15  0510  WBC  14.8*  13.0*  8.5  HGB  7.6*  7.5*  6.7*  HCT  24.1*  23.8*  21.2*  PLT  280  280  224    Coag's Recent Labs     09/13/15  0430  09/14/15  0627  09/15/15  0510  APTT  55*  52*  58*    BMET Recent Labs     09/14/15  0627  09/14/15  1610  09/15/15  0510  NA  140  135  138  K  4.2  3.9  3.9  CL  99*  98*  102  CO2  '27  24  25  '$ BUN  62*  56*  49*  CREATININE  1.30*  1.13*  0.89  GLUCOSE  133*  169*  156*    Electrolytes Recent Labs     09/13/15  0430   09/14/15  0541  09/14/15  0627  09/14/15  1610  09/15/15  0510  CALCIUM  8.8*   < >  9.2  9.3  8.7*  8.6*  MG  2.8*   --    --   3.0*   --   2.7*  PHOS  5.8*   < >  4.0   --   3.5  3.4   < > = values in this interval not displayed.    Sepsis Markers No results for input(s): PROCALCITON, O2SATVEN in the last 72 hours.  Invalid input(s): LACTICACIDVEN  ABG Recent Labs     09/13/15  0844  09/13/15  1209  PHART  7.391  7.409  PCO2ART  46.3*  43.8  PO2ART  64.3*  75.4*    Liver Enzymes Recent Labs     09/13/15  1040   09/14/15  0627  09/14/15  1610  09/15/15  0510  AST  233*   --   248*   --   214*  ALT  59*   --   73*   --   84*  ALKPHOS  405*   --   467*   --   460*  BILITOT  2.7*   --   2.1*   --   1.8*  ALBUMIN  2.0*   < >  2.0*  2.0*  1.8*  1.9*   < > = values in this interval not displayed.    Cardiac Enzymes No results for input(s): TROPONINI, PROBNP in the last 72 hours.  Glucose Recent Labs     09/14/15  0802  09/14/15  1526  09/14/15  1959  09/14/15  2352  09/15/15  0400  09/15/15  0815  GLUCAP  137*  129*  164*  147*  147*  144*    Imaging Dg Abd 1 View  09/13/2015  CLINICAL DATA:  NG tube placement. EXAM: ABDOMEN - 1 VIEW COMPARISON:  None. FINDINGS: 1835 hours. NG tube tip is positioned in the mid to distal stomach. Patchy airspace disease is seen in the lower lungs bilaterally. IMPRESSION: NG tube tip is in the mid to distal stomach. Electronically Signed   By: Misty Stanley M.D.   On: 09/13/2015 19:02   Dg Chest Port 1 View  09/15/2015  CLINICAL DATA:  Respiratory failure. EXAM: PORTABLE CHEST 1 VIEW COMPARISON:  09/14/2015.  CT 08/20/2015. FINDINGS: Endotracheal tube, left IJ line, NG tube in stable position. Persistent dense right lower lobe infiltrate. Persistent bilateral pulmonary masses consistent with metastatic disease. Persistent small right pleural effusion. No interim change. Heart size stable. IMPRESSION: 1. Lines and tubes in  stable position. 2. Persistent right lower lobe dense infiltrate consistent with pneumonia. 3. Persistent bilateral pulmonary masses consistent with metastatic disease. Stable small right pleural effusion. Chest is unchanged from prior exam. Electronically Signed   By: Marcello Moores  Register   On: 09/15/2015 07:06   Dg Chest Port 1 View  09/14/2015  CLINICAL DATA:  Small cell lung cancer EXAM: PORTABLE CHEST 1 VIEW COMPARISON:  09/12/2015 FINDINGS: Left central line, endotracheal tube and NG tube remain in place, unchanged. Dense consolidation in the right  lower lobe compatible with pneumonia. Metastatic disease throughout both lungs, unchanged. Heart is borderline in size. Possible small right effusion. IMPRESSION: No significant change since prior study. Electronically Signed   By: Rolm Baptise M.D.   On: 09/14/2015 07:05   Dg Abd Portable 1v  09/14/2015  CLINICAL DATA:  Abdominal distension EXAM: PORTABLE ABDOMEN - 1 VIEW COMPARISON:  09/13/2015 FINDINGS: Scattered large and small bowel gas is noted. No obstructive changes are seen. A nasogastric catheter remains in the stomach. A left femoral line is seen. No acute bony abnormality is seen. Patchy changes are noted in the right lung base stable from previous exams. IMPRESSION: No acute intra-abdominal abnormality noted. Electronically Signed   By: Inez Catalina M.D.   On: 09/14/2015 09:15  worsening aeration   CULTURES: 1/13 Blood >> neg 1/14 Pneumococcal Ag >> negative 1/14 Legionella Ag >> negative 1/15 C diff PCR >> negative 1/22 BAL>>>yeast candida only 5 k BCX2 1/21>>>  ANTIBIOTICS: 1/13 Rocephin >> 1/13 1/13 Zithromax >> 1/13 1/14 Vancomycin >> 1/19, restart 1/21>>> 1/14 Zosyn >> 1/20. 1/21 Ceftaz >>>  LINES/TUBES: 1/15 Rt PICC >> 1/24 Left IJ CVL 1/24>>> HD cath 1/27>>>  STUDIES: 1/13 CT chest >> multiple b/l nodules, mass like consolidation Rt mid lung, Rt hilar and subcarinal mass, Lt hilar LAN, 2.7 cm Rt paratracheal LAN 1/14 CT abd/pelvis >> 10 cm mass superior to uterus 1/15 Tumor markers >> CA 19-9 453, CEA 3.9, CA 125 780.8 1/16 Bronchoscopy >> crush artifact ?small cell lung cancer (non diagnostic)  EVENTS: 1/13 Admit 1/15 Gyn oncology consulted 1/16 Bronchoscopy in ICU; Placed on BiPAP for wheezing, dyspnea 1/19 IR biopsy>>>Poorly differentiated high Grade Carcinoma.  >stains favor Neuroendocrine. TTF stain was NEGATIVE 1/22 bronch cytology: neg  1/28 EEG>>>  ASSESSMENT/PLAN:  PULMONARY A: Acute hypoxic respiratory failure 2nd to post-obstructive  pneumonia, metastatic cancer, and wheeze from extrinsic airway compression. Probably element of PE improved Clinically improving some w/ decreased O2 and PEEP requirements. Hope that w/ continued negative  volume status we may be able to start weaning in next 24hrs P: -hep drip -cvvhd to neg balance - wean PEEP/FIO2 -assess for SBT in am    CARDIAC A: Persistent  sinus tachycardia-->improved P: - tele   RENAL A: AKI w/ worsening Uremia  Volume OverLoad  Both improved w/ CRRT.  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 DVT: left peroneal vein and Right UE extending into the Subclavian -->picc removed.  Anemia -->getting PRBCs 1/30  P: Heparin gtt for PE/DVT No chemo x 15 days Cbc follow up  INFECTION A: Sepsis 2nd to post-obstructive pneumonia. Fevers resolved and elevated LA are likely secondary to tumor P: - maintain current abx, fever better after start; will complete (day 9/10)   ENDOCRINE A: No acute issues. P: - monitor blood glucose on BMET  NEUROLOGY A: Cancer pain. Acute Encephalopathy. Suspect that this is r/t her AKI and uremia  R/o subclinical seizures, mets-->doubt, would favor uremia as etiology of Encephalopathy  P: - PRN Morphine (per family request) - minimize sedation   Clinically a little better on all fronts (at least in the short term). We now have her PEEP/FIO2 down, she is awake and follows commands (think this is a result of the CRRT). Hopefully we can start weaning in the next 24 hrs. Ideally we get her extubated. I am not convinced that the family is realistic about the goals here (extubate--->hope  for comfort and quality), think they are more optimistic.   Erick Colace ACNP-BC [place] Pager # [phone] OR # [phone] if no answer

## 2015-09-15 NOTE — Progress Notes (Signed)
Date: September 15, 2015 Chart reviewed for concurrent status and case management needs. Will continue to follow patient for changes and needs: Velva Harman, BSN, [place], [place]   [phone]

## 2015-09-15 NOTE — Progress Notes (Signed)
PHARMACIST - PHYSICIAN COMMUNICATION CONCERNING:  IV heparin 39 yoF on IV heparin for DVTs.  Please see note written earlier today by Reuel Boom, PharmD for more details.   Heparin infusion increased earlier for borderline line level and now currently running at 1350 units/hr.  Heparin level rechecked 6 hours after rate increase and now is 0.30, at low end of therapeutic range again (goal 0.3-0.7).  Noted pt did receive 1 unit PRBCs earlier today for hgb 6.7.  RN states pt still with vaginal bleeding.   Heparin dosing wt = 76.8kg.  Will be conservative with dosing.   RECOMMENDATION: No bolus.  Increase heparin infusion to 1450 units/hr ( =14.5 ml/hr) and recheck level in 6 hours.   Ralene Bathe, PharmD, BCPS 09/15/2015, 6:28 PM  Pager: [phone]

## 2015-09-15 NOTE — Progress Notes (Signed)
Patient awoke to voice while RN in the room making rounds. Pt unable to squeeze hands or move extremities at this time, but was clearly able to shake head to answer yes/no questions. Pt husband was sleeping at bedside and was alerted of the mental status change. Pt resting comfortably. Will continue to monitor. S.Tianna Baus, RN

## 2015-09-16 DIAGNOSIS — L8990 Pressure ulcer of unspecified site, unspecified stage: Secondary | ICD-10-CM | POA: Insufficient documentation

## 2015-09-16 LAB — RENAL FUNCTION PANEL
ALBUMIN: 2 g/dL — AB (ref 3.5–5.0)
ALBUMIN: 2.2 g/dL — AB (ref 3.5–5.0)
ANION GAP: 13 (ref 5–15)
Anion gap: 13 (ref 5–15)
BUN: 48 mg/dL — ABNORMAL HIGH (ref 6–20)
BUN: 48 mg/dL — AB (ref 6–20)
CHLORIDE: 102 mmol/L (ref 101–111)
CO2: 25 mmol/L (ref 22–32)
CO2: 25 mmol/L (ref 22–32)
CREATININE: 0.95 mg/dL (ref 0.44–1.00)
Calcium: 9.6 mg/dL (ref 8.9–10.3)
Calcium: 9.5 mg/dL (ref 8.9–10.3)
Chloride: 101 mmol/L (ref 101–111)
Creatinine, Ser: 0.95 mg/dL (ref 0.44–1.00)
Glucose, Bld: 161 mg/dL — ABNORMAL HIGH (ref 65–99)
Glucose, Bld: 200 mg/dL — ABNORMAL HIGH (ref 65–99)
PHOSPHORUS: 2.8 mg/dL (ref 2.5–4.6)
PHOSPHORUS: 3.6 mg/dL (ref 2.5–4.6)
POTASSIUM: 4.2 mmol/L (ref 3.5–5.1)
Potassium: 4 mmol/L (ref 3.5–5.1)
SODIUM: 139 mmol/L (ref 135–145)
Sodium: 140 mmol/L (ref 135–145)

## 2015-09-16 LAB — GLUCOSE, CAPILLARY
GLUCOSE-CAPILLARY: 138 mg/dL — AB (ref 65–99)
GLUCOSE-CAPILLARY: 173 mg/dL — AB (ref 65–99)
GLUCOSE-CAPILLARY: 144 mg/dL — AB (ref 65–99)
Glucose-Capillary: 145 mg/dL — ABNORMAL HIGH (ref 65–99)
Glucose-Capillary: 149 mg/dL — ABNORMAL HIGH (ref 65–99)

## 2015-09-16 LAB — TYPE AND SCREEN
ABO/RH(D): O POS
ANTIBODY SCREEN: NEGATIVE
Unit division: 0

## 2015-09-16 LAB — HEPARIN LEVEL (UNFRACTIONATED)
HEPARIN UNFRACTIONATED: 0.25 [IU]/mL — AB (ref 0.30–0.70)
HEPARIN UNFRACTIONATED: 0.28 [IU]/mL — AB (ref 0.30–0.70)
HEPARIN UNFRACTIONATED: 0.29 [IU]/mL — AB (ref 0.30–0.70)
Heparin Unfractionated: 0.41 IU/mL (ref 0.30–0.70)

## 2015-09-16 LAB — LACTATE DEHYDROGENASE: LDH: 491 U/L — AB (ref 98–192)

## 2015-09-16 LAB — HEPATIC FUNCTION PANEL
ALBUMIN: 2 g/dL — AB (ref 3.5–5.0)
ALT: 102 U/L — ABNORMAL HIGH (ref 14–54)
AST: 188 U/L — ABNORMAL HIGH (ref 15–41)
Alkaline Phosphatase: 561 U/L — ABNORMAL HIGH (ref 38–126)
BILIRUBIN TOTAL: 1.1 mg/dL (ref 0.3–1.2)
Bilirubin, Direct: 0.6 mg/dL — ABNORMAL HIGH (ref 0.1–0.5)
Indirect Bilirubin: 0.5 mg/dL (ref 0.3–0.9)
TOTAL PROTEIN: 7 g/dL (ref 6.5–8.1)

## 2015-09-16 LAB — APTT: APTT: 53 s — AB (ref 24–37)

## 2015-09-16 LAB — MAGNESIUM: MAGNESIUM: 2.7 mg/dL — AB (ref 1.7–2.4)

## 2015-09-16 MED ORDER — SODIUM PHOSPHATE 3 MMOLE/ML IV SOLN
20.0000 mmol | Freq: Once | INTRAVENOUS | Status: AC
  Administered 2015-09-16: 20 mmol via INTRAVENOUS
  Filled 2015-09-16: qty 6.67

## 2015-09-16 MED ORDER — HEPARIN (PORCINE) IN NACL 100-0.45 UNIT/ML-% IJ SOLN
1850.0000 [IU]/h | INTRAMUSCULAR | Status: DC
  Administered 2015-09-16 – 2015-09-17 (×2): 1850 [IU]/h via INTRAVENOUS
  Filled 2015-09-16 (×4): qty 250

## 2015-09-16 NOTE — Progress Notes (Signed)
Nutrition Follow-up  DOCUMENTATION CODES:   Not applicable  INTERVENTION:  Continue NePro @ goal of 45 ml/hr. 30 ml Prostat BID This TF regimen provides 2144 kcal (104% of needs), 117g of protein and 785 ml H20.   NUTRITION DIAGNOSIS:   Inadequate oral intake related to inability to eat as evidenced by NPO status.  ongoing  GOAL:   Patient will meet greater than or equal to 90% of their needs  Meeting  MONITOR:   TF tolerance, Skin, Vent status, Labs, I & O's  REASON FOR ASSESSMENT:   Consult Enteral/tube feeding initiation and management  ASSESSMENT:   Presented with about the month worsens worsening shortness of breath and wheezing. She has been seen for this by her primary care provider diagnosed with pneumonia based on chest x-ray and completed antibiotics is not improvement repeat chest x-ray showed persistent pneumonia she had a total three courses of antibiotics Including z-pack, levaquin and clarithromycin. Suspect that she has a malignancy of sort, however, it is unclear what the primary is;  Multi-compartmental masses (chest, adenopathy, ovarian).   Pt remains on CRRT, hasn't been weighed since 1/28. Currently 5L have been removed from pt.  Patient is currently intubated on ventilator support MV: 11.6 L/min Temp (24hrs), Avg:97.7 F (36.5 C), Min:96 F (35.6 C), Max:98.4 F (36.9 C)  Propofol: None  Per rounds, pt to be extubated within 1-2 days. Currently weaning, but still very weak. Toleratign NePro @ 45 no issues.  Labs: CBGs 138-153, BUN 48, Mg 2.7, AST 188, ALT 102, Alk Phos 561. Medications: Versed PRN,    Diet Order:  Diet NPO time specified  Skin:  Reviewed, no issues  Last BM:  1/29  Height:   Ht Readings from Last 1 Encounters:  09/13/15 5' 5.5" (1.664 m)    Weight:   Wt Readings from Last 1 Encounters:  No data found for Wt    Ideal Body Weight:  57.95 kg (kg)  BMI:  Body mass index is 35.57 kg/(m^2).  Estimated  Nutritional Needs:   Kcal:  2065  Protein:  120-130g  Fluid:  >/= 2 L/day  EDUCATION NEEDS:   No education needs at this time  Satira Anis. [employer] Null, MS, RD LDN After Hours/Weekend Pager [phone]

## 2015-09-16 NOTE — Progress Notes (Signed)
PCCM PROGRESS NOTE  ADMISSION DATE: 09/04/2015 CONSULT DATE: 08/30/2015 REFERRING PROVIDER: Dr. Roel Cluck, [facility]  CC: Short of breath  SUBJECTIVE:  Weaning, tries to f/c. Profoundly weak   VITAL SIGNS: BP 105/71 mmHg  Pulse 118  Temp(Src) 98.4 F (36.9 C) (Oral)  Resp 24  Ht 5' 5.5" (1.664 m)  Wt 217 lb 2.5 oz (98.5 kg)  BMI 35.57 kg/m2  SpO2 95%  LMP 09/13/2015  INTAKE/OUTPUT: I/O last 3 completed shifts: In: 3831.3 [I.V.:1190.2; Blood:330; NG/GT:1961.2; IV Piggyback:350] Out: 0263 [Urine:37; ZCHYI:5027; Stool:550]  General:now awake, follows commands. Responds appropriately, a little weaker today   HEENT: ETT, No JVD Cardiac: s1 s2 regular, No MRG Chest: coarse BS, rhonchi w/ diffuse wheeze improved some Abd: soft exam. + BS, no r/g, abdo wall edema Ext: 3+ edema lowers, uppers 2 plus Neuro: Now awake and follows commands Skin: no rashes, anasarca improving   CBC Recent Labs     09/14/15  0627  09/15/15  0510  09/16/15  0421  WBC  13.0*  8.5  6.8  HGB  7.5*  6.7*  8.6*  HCT  23.8*  21.2*  27.2*  PLT  280  224  221    Coag's Recent Labs     09/14/15  0627  09/15/15  0510  09/16/15  0421  APTT  52*  58*  53*    BMET Recent Labs     09/15/15  0510  09/15/15  1600  09/16/15  0421  NA  138  140  140  K  3.9  3.9  4.2  CL  102  101  102  CO2  '25  27  25  '$ BUN  49*  45*  48*  CREATININE  0.89  0.82  0.95  GLUCOSE  156*  163*  161*    Electrolytes Recent Labs     09/14/15  0627   09/15/15  0510  09/15/15  1600  09/16/15  0421  CALCIUM  9.3   < >  8.6*  9.2  9.6  MG  3.0*   --   2.7*   --   2.7*  PHOS   --    < >  3.4  3.0  2.8   < > = values in this interval not displayed.    Sepsis Markers No results for input(s): PROCALCITON, O2SATVEN in the last 72 hours.  Invalid input(s): LACTICACIDVEN  ABG Recent Labs     09/13/15  1209  PHART  7.409  PCO2ART  43.8  PO2ART  75.4*    Liver Enzymes Recent Labs     09/14/15  0627    09/15/15  0510  09/15/15  1600  09/16/15  0421  AST  248*   --   214*   --   188*  ALT  73*   --   84*   --   102*  ALKPHOS  467*   --   460*   --   561*  BILITOT  2.1*   --   1.8*   --   1.1  ALBUMIN  2.0*   < >  1.8*  1.9*  1.9*  2.0*  2.0*   < > = values in this interval not displayed.    Cardiac Enzymes No results for input(s): TROPONINI, PROBNP in the last 72 hours.  Glucose Recent Labs     09/15/15  0815  09/15/15  1127  09/15/15  1727  09/15/15  2013  09/16/15  7412  09/16/15  0804  GLUCAP  144*  134*  146*  153*  138*  145*    Imaging Dg Chest Port 1 View  09/15/2015  CLINICAL DATA:  Respiratory failure. EXAM: PORTABLE CHEST 1 VIEW COMPARISON:  09/14/2015.  CT 09/13/2015. FINDINGS: Endotracheal tube, left IJ line, NG tube in stable position. Persistent dense right lower lobe infiltrate. Persistent bilateral pulmonary masses consistent with metastatic disease. Persistent small right pleural effusion. No interim change. Heart size stable. IMPRESSION: 1. Lines and tubes in  stable position. 2. Persistent right lower lobe dense infiltrate consistent with pneumonia. 3. Persistent bilateral pulmonary masses consistent with metastatic disease. Stable small right pleural effusion. Chest is unchanged from prior exam. Electronically Signed   By: Marcello Moores  Register   On: 09/15/2015 07:06  worsening aeration   CULTURES: 1/13 Blood >> neg 1/14 Pneumococcal Ag >> negative 1/14 Legionella Ag >> negative 1/15 C diff PCR >> negative 1/22 BAL>>>yeast candida only 5 k BCX2 1/21>>>  ANTIBIOTICS: 1/13 Rocephin >> 1/13 1/13 Zithromax >> 1/13 1/14 Vancomycin >> 1/19, restart 1/21>>> 1/14 Zosyn >> 1/20. 1/21 Ceftaz >>>  LINES/TUBES: 1/15 Rt PICC >> 1/24 Left IJ CVL 1/24>>> HD cath 1/27>>>  STUDIES: 1/13 CT chest >> multiple b/l nodules, mass like consolidation Rt mid lung, Rt hilar and subcarinal mass, Lt hilar LAN, 2.7 cm Rt paratracheal LAN 1/14 CT abd/pelvis >> 10 cm mass  superior to uterus 1/15 Tumor markers >> CA 19-9 453, CEA 3.9, CA 125 780.8 1/16 Bronchoscopy >> crush artifact ?small cell lung cancer (non diagnostic)  EVENTS: 1/13 Admit 1/15 Gyn oncology consulted 1/16 Bronchoscopy in ICU; Placed on BiPAP for wheezing, dyspnea 1/19 IR biopsy>>>Poorly differentiated high Grade Carcinoma.  >stains favor Neuroendocrine. TTF stain was NEGATIVE 1/22 bronch cytology: neg  1/28 EEG>>> neg 1/31 weaning   ASSESSMENT/PLAN:  PULMONARY A: Acute hypoxic respiratory failure 2nd to post-obstructive pneumonia, metastatic cancer, and wheeze from extrinsic airway compression. Probably element of PE improved Clinically improving some, now weaning but very weak and would be concerned about cough mechanics P: -hep drip -cvvhd to neg balance - cont PSV -assess for SBT in am  - hope extubation soon   CARDIAC A: Persistent  sinus tachycardia-->improved P: - tele   RENAL A: AKI w/ worsening Uremia Volume OverLoad  Both improved w/ CRRT.  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 DVT: left peroneal vein and Right UE extending into the Subclavian -->picc removed.  Anemia -->getting PRBCs 1/30  P: Heparin gtt for PE/DVT No chemo x 14 days Cbc follow up  INFECTION A: Sepsis 2nd to post-obstructive pneumonia. Fevers resolved and elevated LA are likely secondary to tumor P: Complete d10 abx on 1/31 so will dc  ENDOCRINE A: No acute issues. P: - monitor blood glucose on BMET  NEUROLOGY A: Cancer pain. Acute Encephalopathy. Suspect that this is r/t her AKI and uremia  R/o  subclinical seizures, mets-->doubt, would favor uremia as etiology of Encephalopathy  P: - PRN Morphine (per family request) - minimize sedation   Clinically a little better on all fronts (at least in the short term). Now weaning.  I remain not convinced that the family is realistic  about the goals here (extubate--->hope for comfort and quality), think they are more optimistic. She is very weak. Have encouraged passive range of motion w/ family. Hope extubation in next 24-48hrs.   Erick Colace ACNP-BC [place] Pager # [phone] OR # [phone] if no answer

## 2015-09-16 NOTE — Progress Notes (Signed)
[facility] Progress Note   Subjective: no problems with CRRT.  No UOP -  Removed 5 liters the last 24 hours with CRRT   - CVP down to 7 -  They are working with the vent to hopefully extubate tomorrow    Filed Vitals:   09/16/15 1130 09/16/15 1136 09/16/15 1145 09/16/15 1200  BP: 127/72 127/72 121/81 126/77  Pulse: 123 129 125 127  Temp:      TempSrc:      Resp: '24 27 20 30  '$ Height:      Weight:      SpO2: 94% 95% 96% 93%    Inpatient medications: . antiseptic oral rinse  7 mL Mouth Rinse BID  . antiseptic oral rinse  7 mL Mouth Rinse QID  . budesonide (PULMICORT) nebulizer solution  0.5 mg Nebulization BID  . chlorhexidine gluconate  15 mL Mouth Rinse BID  . feeding supplement (PRO-STAT SUGAR FREE 64)  30 mL Per Tube BID  . levalbuterol  0.63 mg Nebulization Q6H  . pantoprazole sodium  40 mg Per Tube Daily  . sodium chloride  10-40 mL Intracatheter Q12H  . sodium chloride  3 mL Intravenous Q12H   . feeding supplement (NEPRO CARB STEADY) 1,000 mL (09/16/15 1146)  . heparin 1,850 Units (09/16/15 1200)  . dialysis replacement fluid (prismasate) 400 mL/hr at 09/15/15 2354  . dialysis replacement fluid (prismasate) 200 mL/hr at 09/16/15 0033  . dialysate (PRISMASATE) 2,000 mL/hr at 09/16/15 1018   alteplase, alteplase, artificial tears, heparin, heparin lock flush, heparin lock flush, hydrALAZINE, levalbuterol, loperamide, midazolam, morphine injection, [DISCONTINUED] ondansetron **OR** ondansetron (ZOFRAN) IV, polyvinyl alcohol, simethicone, sodium chloride, sodium chloride, sodium chloride flush, sodium chloride flush  Exam: More alert No jvd Chest coarse BS bilat RRR no mrg Abd soft mod distended no mass GU foley in place Ext diffuse 2-3+ ext edema x 4- left femoral cath placed 1/27 Neuro not responding   CXR RML consolidation, scattered nodular changes UA 1/21 > protein neg, 0-5 rbc/wbc's   Assessment: 1 Acute renal failure - suspect ATN,  oliguric/anuric. CRRT day #5. Uremia better but still have a ways to go with volume- to continue CRRT for now- no heparin in machine but on heparin drip for DVTs.  Timing of trying to stop dialysis support is tricky- will wait at least until extubated- I am not sure once we stop that she will pick up UOP right away- did get carboplatin so possibly nephrotox from that.  Am hopeful for eventual recovery of renal function if no other signifiant events 2 Vol excess up about 16kg- continue to UF as able - no current weights- cont with some UF 3 Anemia Hb 8.6 after transfusion- transfuse as needed 4 Metastatic cancer, neuroendocrine type-  SP chemo x 1 on 1/25- obviously a complicating factor  5 Resp failure - PNA/ nodular mets, + vol excess. FiO2 down 0.40,. CXR better w vol removal 6. Elytes- OK - all 4 K dialysate - phos down some- will replete    Monique [name] A   09/16/2015, 12:26 PM    Recent Labs Lab 09/15/15 0510 09/15/15 1600 09/16/15 0421  NA 138 140 140  K 3.9 3.9 4.2  CL 102 101 102  CO2 '25 27 25  '$ GLUCOSE 156* 163* 161*  BUN 49* 45* 48*  CREATININE 0.89 0.82 0.95  CALCIUM 8.6* 9.2 9.6  PHOS 3.4 3.0 2.8    Recent Labs Lab 09/14/15 0627  09/15/15 0510 09/15/15 1600 09/16/15 0421  AST 248*  --  214*  --  188*  ALT 73*  --  84*  --  102*  ALKPHOS 467*  --  460*  --  561*  BILITOT 2.1*  --  1.8*  --  1.1  PROT 6.8  --  6.1*  --  7.0  ALBUMIN 2.0*  < > 1.8*  1.9* 1.9* 2.0*  2.0*  < > = values in this interval not displayed.  Recent Labs Lab 09/14/15 0627 09/15/15 0510 09/16/15 0421  WBC 13.0* 8.5 6.8  NEUTROABS  --  8.2*  --   HGB 7.5* 6.7* 8.6*  HCT 23.8* 21.2* 27.2*  MCV 85.3 85.5 86.6  PLT 280 224 221

## 2015-09-16 NOTE — Progress Notes (Signed)
ANTICOAGULATION CONSULT NOTE - Follow up  Pharmacy Consult for heparin IV  Indication: DVT noted in the subclavian vein and axillary vein.  No Known Allergies  Patient Measurements: Height: 5' 5.5" (166.4 cm) Weight:  (unable to weigh-bed scale not functioning) IBW/kg (Calculated) : 58.15 Heparin Dosing Weight: 76.8 kg  Vital Signs: Temp: 98.4 F (36.9 C) (01/31 0800) Temp Source: Oral (01/31 0800) BP: 115/71 mmHg (01/31 0800) Pulse Rate: 116 (01/31 0800)  Labs:  Recent Labs  09/14/15 0627  09/15/15 0510 09/15/15 1600  09/16/15 0115 09/16/15 0421 09/16/15 0840  HGB 7.5*  --  6.7*  --   --   --  8.6*  --   HCT 23.8*  --  21.2*  --   --   --  27.2*  --   PLT 280  --  224  --   --   --  221  --   APTT 52*  --  58*  --   --   --  53*  --   HEPARINUNFRC 0.34  --  0.31  --   < > 0.25* 0.28* 0.29*  CREATININE 1.30*  < > 0.89 0.82  --   --  0.95  --   < > = values in this interval not displayed.  Estimated Creatinine Clearance: 92.3 mL/min (by C-G formula based on Cr of 0.95).  Assessment: Pt is a 40 yo female diagnosed with DVT noted in the L peroneal vein and RUE at PICC site >> PICC removed. Pt is currently intubated with acute hypoxic respiratory failure secondary to post-obstructive pneumonia, metastatic cancer, and wheeze from extrinsic airway compression.  PCCM thinks probably element of PE as well.  Hgb has been trending down recently, will monitor closely.  Today, 1/31  Heparin level still just below target range on 1600units/hr  Hgb better after transfusion 1/30. Pltc stable. No bleeding except for vaginal spotting.  CRRT since 1/27   Goal of Therapy:  Heparin level 0.3-0.7 units/ml Monitor platelets by anticoagulation protocol: Yes   Plan:   Increase heparin drip to 1850 units/hr.       Heparin level daily.       Check CBC q24h while on heparin.       Recheck HL in 6 hours.  Romeo Rabon, PharmD, pager [phone]. 09/16/2015,9:23 AM.

## 2015-09-16 NOTE — Progress Notes (Signed)
ANTICOAGULATION CONSULT NOTE - Follow up  Pharmacy Consult for heparin IV  Indication: DVT noted in the subclavian vein and axillary vein.  No Known Allergies  Patient Measurements: Height: 5' 5.5" (166.4 cm) Weight:  (bed scale not working at this time) IBW/kg (Calculated) : 58.15 Heparin Dosing Weight: 76.8 kg  Vital Signs: Temp: 98.3 F (36.8 C) (01/31 0000) Temp Source: Axillary (01/31 0000) BP: 123/72 mmHg (01/31 0200) Pulse Rate: 117 (01/31 0200)  Labs:  Recent Labs  09/13/15 0430  09/14/15 0627 09/14/15 1610 09/15/15 0510 09/15/15 1600 09/15/15 1715 09/16/15 0115  HGB 7.6*  --  7.5*  --  6.7*  --   --   --   HCT 24.1*  --  23.8*  --  21.2*  --   --   --   PLT 280  --  280  --  224  --   --   --   APTT 55*  --  52*  --  58*  --   --   --   HEPARINUNFRC 0.41  --  0.34  --  0.31  --  0.30 0.25*  CREATININE 2.41*  < > 1.30* 1.13* 0.89 0.82  --   --   < > = values in this interval not displayed.  Estimated Creatinine Clearance: 107 mL/min (by C-G formula based on Cr of 0.82).   Medical History: History reviewed. No pertinent past medical history.  Medications:  Scheduled:  . antiseptic oral rinse  7 mL Mouth Rinse BID  . antiseptic oral rinse  7 mL Mouth Rinse QID  . budesonide (PULMICORT) nebulizer solution  0.5 mg Nebulization BID  . cefTAZidime (FORTAZ)  IV  2 g Intravenous Q12H  . chlorhexidine gluconate  15 mL Mouth Rinse BID  . feeding supplement (PRO-STAT SUGAR FREE 64)  30 mL Per Tube BID  . fluconazole (DIFLUCAN) IV  400 mg Intravenous Q24H  . levalbuterol  0.63 mg Nebulization Q6H  . pantoprazole sodium  40 mg Per Tube Daily  . sodium chloride  10-40 mL Intracatheter Q12H  . sodium chloride  3 mL Intravenous Q12H    Assessment: Pt is a 40 yo female diagnosed with DVT noted in the L peroneal vein and RUE at PICC site >> PICC removed. Pt is currently intubated with acute hypoxic respiratory failure secondary to post-obstructive pneumonia,  metastatic cancer, and wheeze from extrinsic airway compression.  PCCM thinks probably element of PE as well.  Hgb has been trending down recently, will monitor closely.  1/30  Hgb continues to drop to 6.7 overnight - receiving PRBC x 1  Heparin level now just above lower end of therapeutic range on 1300 units/hr; has been decreasing since initiation  H/H and platelets are stable. No bleeding reported/documented.  CRRT initiated on 1/27  HL=1450, vaginal bleeding Today, 1/31  0115 HL=0.25 , no IV interuptions, minimal vaginal spotting per RN    Goal of Therapy:  Heparin level 0.3-0.7 units/ml Monitor platelets by anticoagulation protocol: Yes   Plan:   Increase heparin drip to 1600 units/hr       Heparin level daily.       Check CBC q24h while on heparin.       Recheck HL in 6 hours   Danely, Bay[place]iss 09/16/2015, 2:15 AM

## 2015-09-16 NOTE — Progress Notes (Signed)
The patient has patchy areas of brownish, grayish, non-blanchable areas on the buttocks, inner thighs, anterior and posterior thighs.  These discolored areas are new.

## 2015-09-16 NOTE — Progress Notes (Signed)
   09/16/15 1100  Clinical Encounter Type  Visited With Patient and family together  Visit Type Follow-up;Psychological support;Spiritual support;Critical Care  Spiritual Encounters  Spiritual Needs Emotional;Other (Comment) (Pastoral Conversation/Support)  Stress Factors  Patient Stress Factors Other (Comment) (Breathing tube)  Family Stress Factors Health changes   I followed up with Monique [name] and her family today. The patient was alert and had her eyes open upon my arrival. She was able to acknowledge that I was there and who I was. The patient appeared weak and would cough, but looked better today than she has been.  The husband, brother and father were at the bedside and all were extremely optimistic. They were relieved to see the patient with her eyes open.  The patient's husband stated that the patient's son came to visit yesterday and was able to speak with his mother.  They believe that the patient will be extubated soon and are relieved that she is breathing on her own.  They requested follow-up, particularly with the son, Jerline Pain.   I will continue to follow-up with the family and the patient. Please page if needed before the next visit.    [place] M.Div.

## 2015-09-16 NOTE — Progress Notes (Signed)
ANTICOAGULATION CONSULT NOTE - Follow up  Pharmacy Consult for heparin IV  Indication: DVT noted in the subclavian vein and axillary vein.  No Known Allergies  Patient Measurements: Height: 5' 5.5" (166.4 cm) Weight:  (unable to weigh-bed scale not functioning) IBW/kg (Calculated) : 58.15 Heparin Dosing Weight: 76.8 kg  Vital Signs: Temp: 97.2 F (36.2 C) (01/31 1550) Temp Source: Oral (01/31 1550) BP: 108/64 mmHg (01/31 1600) Pulse Rate: 122 (01/31 1600)  Labs:  Recent Labs  09/14/15 0627  09/15/15 0510 09/15/15 1600  09/16/15 0421 09/16/15 0840 09/16/15 1405  HGB 7.5*  --  6.7*  --   --  8.6*  --   --   HCT 23.8*  --  21.2*  --   --  27.2*  --   --   PLT 280  --  224  --   --  221  --   --   APTT 52*  --  58*  --   --  53*  --   --   HEPARINUNFRC 0.34  --  0.31  --   < > 0.28* 0.29* 0.41  CREATININE 1.30*  < > 0.89 0.82  --  0.95  --   --   < > = values in this interval not displayed.  Estimated Creatinine Clearance: 92.3 mL/min (by C-G formula based on Cr of 0.95).  Assessment: Pt is a 40 yo female diagnosed with DVT noted in the L peroneal vein and RUE at PICC site >> PICC removed. Pt is currently intubated with acute hypoxic respiratory failure secondary to post-obstructive pneumonia, metastatic cancer, and wheeze from extrinsic airway compression.  PCCM thinks probably element of PE as well.  Hgb has been trending down recently, will monitor closely.  Today, 1/31  Heparin level now therapeutic (0.41) on 1850 units/hr  Hgb better after transfusion 1/30. Pltc stable. No bleeding except for vaginal spotting (RN states per husband, this is unchanged since admission).  CRRT since 1/27   Goal of Therapy:  Heparin level 0.3-0.7 units/ml Monitor platelets by anticoagulation protocol: Yes   Plan:   Continue heparin drip at 1850 units/hr.       Heparin level and CBC daily.  Peggyann Juba, PharmD, BCPS Pager: [phone] 09/16/2015,4:25 PM.

## 2015-09-17 DIAGNOSIS — K567 Ileus, unspecified: Secondary | ICD-10-CM

## 2015-09-17 LAB — CBC
HEMATOCRIT: 27.2 % — AB (ref 36.0–46.0)
HEMATOCRIT: 26.2 % — AB (ref 36.0–46.0)
HEMOGLOBIN: 8.6 g/dL — AB (ref 12.0–15.0)
HEMOGLOBIN: 8.4 g/dL — AB (ref 12.0–15.0)
MCH: 27.4 pg (ref 26.0–34.0)
MCH: 27.5 pg (ref 26.0–34.0)
MCHC: 31.6 g/dL (ref 30.0–36.0)
MCHC: 32.1 g/dL (ref 30.0–36.0)
MCV: 86.6 fL (ref 78.0–100.0)
MCV: 85.6 fL (ref 78.0–100.0)
Platelets: 221 10*3/uL (ref 150–400)
Platelets: 175 10*3/uL (ref 150–400)
RBC: 3.14 MIL/uL — ABNORMAL LOW (ref 3.87–5.11)
RBC: 3.06 MIL/uL — AB (ref 3.87–5.11)
RDW: 14.8 % (ref 11.5–15.5)
RDW: 15.2 % (ref 11.5–15.5)
WBC: 6.8 10*3/uL (ref 4.0–10.5)
WBC: 3.6 10*3/uL — ABNORMAL LOW (ref 4.0–10.5)

## 2015-09-17 LAB — HEPARIN LEVEL (UNFRACTIONATED)
Heparin Unfractionated: 0.35 IU/mL (ref 0.30–0.70)
Heparin Unfractionated: 0.3 IU/mL (ref 0.30–0.70)

## 2015-09-17 LAB — RENAL FUNCTION PANEL
ALBUMIN: 2.2 g/dL — AB (ref 3.5–5.0)
ANION GAP: 13 (ref 5–15)
ANION GAP: 10 (ref 5–15)
Albumin: 2.3 g/dL — ABNORMAL LOW (ref 3.5–5.0)
BUN: 49 mg/dL — ABNORMAL HIGH (ref 6–20)
BUN: 44 mg/dL — ABNORMAL HIGH (ref 6–20)
CALCIUM: 9.3 mg/dL (ref 8.9–10.3)
CALCIUM: 9.1 mg/dL (ref 8.9–10.3)
CHLORIDE: 100 mmol/L — AB (ref 101–111)
CO2: 24 mmol/L (ref 22–32)
CO2: 26 mmol/L (ref 22–32)
CREATININE: 0.79 mg/dL (ref 0.44–1.00)
Chloride: 102 mmol/L (ref 101–111)
Creatinine, Ser: 0.91 mg/dL (ref 0.44–1.00)
GFR calc non Af Amer: >60 mL/min (ref >60)
GLUCOSE: 163 mg/dL — AB (ref 65–99)
Glucose, Bld: 142 mg/dL — ABNORMAL HIGH (ref 65–99)
PHOSPHORUS: 3.3 mg/dL (ref 2.5–4.6)
Phosphorus: 3.4 mg/dL (ref 2.5–4.6)
Potassium: 4.3 mmol/L (ref 3.5–5.1)
Potassium: 4.2 mmol/L (ref 3.5–5.1)
SODIUM: 137 mmol/L (ref 135–145)
SODIUM: 138 mmol/L (ref 135–145)

## 2015-09-17 LAB — GLUCOSE, CAPILLARY
GLUCOSE-CAPILLARY: 124 mg/dL — AB (ref 65–99)
GLUCOSE-CAPILLARY: 140 mg/dL — AB (ref 65–99)
GLUCOSE-CAPILLARY: 159 mg/dL — AB (ref 65–99)
Glucose-Capillary: 156 mg/dL — ABNORMAL HIGH (ref 65–99)
Glucose-Capillary: 126 mg/dL — ABNORMAL HIGH (ref 65–99)
Glucose-Capillary: 139 mg/dL — ABNORMAL HIGH (ref 65–99)
Glucose-Capillary: 137 mg/dL — ABNORMAL HIGH (ref 65–99)

## 2015-09-17 LAB — APTT: APTT: 51 s — AB (ref 24–37)

## 2015-09-17 LAB — MAGNESIUM: Magnesium: 2.8 mg/dL — ABNORMAL HIGH (ref 1.7–2.4)

## 2015-09-17 MED ORDER — SODIUM CHLORIDE 0.9 % IV BOLUS (SEPSIS)
750.0000 mL | Freq: Once | INTRAVENOUS | Status: AC
  Administered 2015-09-17: 750 mL via INTRAVENOUS

## 2015-09-17 MED ORDER — HEPARIN (PORCINE) IN NACL 100-0.45 UNIT/ML-% IJ SOLN
1850.0000 [IU]/h | INTRAMUSCULAR | Status: DC
  Administered 2015-09-17 – 2015-09-18 (×2): 1950 [IU]/h via INTRAVENOUS
  Administered 2015-09-19 – 2015-09-22 (×6): 1850 [IU]/h via INTRAVENOUS
  Filled 2015-09-17 (×12): qty 250

## 2015-09-17 NOTE — Progress Notes (Signed)
Paged Dr. Edrick Kins, oncologist, at the family's request.

## 2015-09-17 NOTE — Progress Notes (Signed)
   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 The husband, brother and father were present during the meeting.  The husband is still having a difficult time grasping the severity of the patient's condition. He gets angry when discussing the care that she has received and issues that he feels there has with that care. The patient's husband feels that some of the supportive measures (medications, vent) have led to her decline.  The patient's doctor was able to ask the family to have a conversation with the patient about her own goals of care. The husband would like the patient extubated, but the doctor wants to know what plan the team has moving forward if the patient declines once extubated.  At this time, the husband wants all measures taken.  The patient's brother and father seem to understand the severity of the patient's condition and do not want her to suffer. They are worried about how the patient's husband is going to take it if the patient declines more.  I debriefed with the father and brother after the meeting with the doctor and they voiced their concern about not wanting the patient to suffer anymore. They are not sure how the husband will take it if they voice their concerns.  I also debriefed with the husband who was at the patient's bedside. He was frustrated and angry due to grief. He feels that people are giving up hope and not giving the patient a chance. We talked about whether he  had a conversation about her wishes in the past. He stated that she would change the subject and say that he was being pessimistic if he would mention it. He agrees to trying to get the patient to state her wishes. I have advised the patient's brother and father to attempt to have that conversation with the husband for extra support.  The family knows that they can call for Spiritual Care.  I will continue to follow-up with this patient and her family.    [place] M.Div.

## 2015-09-17 NOTE — Progress Notes (Signed)
At 0210 CRRT machine malfunction error alarmed. RN and charge nurse assessed machine and followed instructions to reboot machine.  Subsequently the CRRT filter had to be changed to resolve the problem and the patient did not get that blood returned.  New filter setup and CRRT resumed at 0250. Pt vital signs stable throughout transition. Will continue to monitor patient.

## 2015-09-17 NOTE — Progress Notes (Signed)
   09/17/15 1400  Clinical Encounter Type  Visited With Patient and family together  Visit Type Follow-up;Social support  Spiritual Encounters  Spiritual Needs Emotional;Grief support  Stress Factors  Patient Stress Factors Not reviewed  Family Stress Factors Exhausted;Family relationships;Loss of control;Major life changes   Counseling intern provided brief emotional support to pt's husband, brother, and father. Pt's husband expressed feelings of frustration and anger that "things have not gone like they should" and that perhaps the outcome of his wife's situation could have been different if things had been handled differently. Pt's brother and father reported feeling exhausted but continuing to support pt's husband's decisions within reason, as long as they are not causing pain and suffering. They suggested that pt's husband may benefit from individual counseling support. Counseling intern will follow up with pt's husband later this afternoon/evening.  Duffy Rhody Counseling Intern

## 2015-09-17 NOTE — Progress Notes (Signed)
PCCM PROGRESS NOTE  ADMISSION DATE: 08/28/2015 CONSULT DATE: 08/30/2015 REFERRING PROVIDER: Dr. Roel Cluck, [facility]  CC: Short of breath  SUBJECTIVE:  Weaning, tries to f/c. Profoundly weak   VITAL SIGNS: BP 106/83 mmHg  Pulse 125  Temp(Src) 97.7 F (36.5 C) (Oral)  Resp 20  Ht 5' 5.5" (1.664 m)  Wt 217 lb 2.5 oz (98.5 kg)  BMI 35.57 kg/m2  SpO2 96%  LMP 08/27/2015  INTAKE/OUTPUT: I/O last 3 completed shifts: In: 2902.9 [I.V.:704.9; NG/GT:1910; IV Piggyback:288] Out: 9528 [Urine:40; UXLKG:4010; UVOZD:664]  General:now awake, follows commands. Responds appropriately, a little weaker today   HEENT: ETT, No JVD Cardiac: s1 s2 regular, No MRG Chest: coarse BS, rhonchi w/ diffuse wheeze improved some Abd: soft exam. + BS, no r/g, abdo wall edema Ext: 3+ edema lowers, uppers 2 plus Neuro: Now awake and follows commands Skin: no rashes, anasarca improving   CBC Recent Labs     09/15/15  0510  09/16/15  0421  WBC  8.5  6.8  HGB  6.7*  8.6*  HCT  21.2*  27.2*  PLT  224  221    Coag's Recent Labs     09/15/15  0510  09/16/15  0421  09/17/15  0420  APTT  58*  53*  51*    BMET Recent Labs     09/16/15  0421  09/16/15  1405  09/17/15  0420  NA  140  139  137  K  4.2  4.0  4.3  CL  102  101  100*  CO2  '25  25  24  '$ BUN  48*  48*  49*  CREATININE  0.95  0.95  0.91  GLUCOSE  161*  200*  163*    Electrolytes Recent Labs     09/15/15  0510   09/16/15  0421  09/16/15  1405  09/17/15  0420  CALCIUM  8.6*   < >  9.6  9.5  9.3  MG  2.7*   --   2.7*   --   2.8*  PHOS  3.4   < >  2.8  3.6  3.4   < > = values in this interval not displayed.    Sepsis Markers No results for input(s): PROCALCITON, O2SATVEN in the last 72 hours.  Invalid input(s): LACTICACIDVEN  ABG No results for input(s): PHART, PCO2ART, PO2ART in the last 72 hours.  Liver Enzymes Recent Labs     09/15/15  0510   09/16/15  0421  09/16/15  1405  09/17/15  0420  AST  214*   --   188*    --    --   ALT  84*   --   102*   --    --   ALKPHOS  460*   --   561*   --    --   BILITOT  1.8*   --   1.1   --    --   ALBUMIN  1.8*  1.9*   < >  2.0*  2.0*  2.2*  2.3*   < > = values in this interval not displayed.    Cardiac Enzymes No results for input(s): TROPONINI, PROBNP in the last 72 hours.  Glucose Recent Labs     09/16/15  1258  09/16/15  1516  09/16/15  2023  09/16/15  2342  09/17/15  0417  09/17/15  0758  GLUCAP  149*  173*  144*  159*  156*  124*  Imaging No results found.worsening aeration   CULTURES: 1/13 Blood >> neg 1/14 Pneumococcal Ag >> negative 1/14 Legionella Ag >> negative 1/15 C diff PCR >> negative 1/22 BAL>>>yeast candida only 5 k BCX2 1/21>>>neg  ANTIBIOTICS: 1/13 Rocephin >> 1/13 1/13 Zithromax >> 1/13 1/14 Vancomycin >> 1/19, restart 1/21>>> 1/14 Zosyn >> 1/20. 1/21 Ceftaz >>>  LINES/TUBES: 1/15 Rt PICC >> 1/24 Left IJ CVL 1/24>>> HD cath 1/27>>>  STUDIES: 1/13 CT chest >> multiple b/l nodules, mass like consolidation Rt mid lung, Rt hilar and subcarinal mass, Lt hilar LAN, 2.7 cm Rt paratracheal LAN 1/14 CT abd/pelvis >> 10 cm mass superior to uterus 1/15 Tumor markers >> CA 19-9 453, CEA 3.9, CA 125 780.8 1/16 Bronchoscopy >> crush artifact ?small cell lung cancer (non diagnostic)  EVENTS: 1/13 Admit 1/15 Gyn oncology consulted 1/16 Bronchoscopy in ICU; Placed on BiPAP for wheezing, dyspnea 1/19 IR biopsy>>>Poorly differentiated high Grade Carcinoma.  >stains favor Neuroendocrine. TTF stain was NEGATIVE 1/22 bronch cytology: neg  1/28 EEG>>> neg 1/31 weaning  2/1: gave fluid challenge and changed to even volume status.   ASSESSMENT/PLAN:  PULMONARY A: Acute hypoxic respiratory failure 2nd to post-obstructive pneumonia, metastatic cancer, and wheeze from extrinsic airway compression. Probably element of PE improved Clinically improving some,  Passed SBT but she is very weak. Concerned that her cough mechanics  and strength may be an issue  P: - hep drip - cont PSV as tolerated - will need to speak w/ family re: if she were to be extubated would we re-intubate?   CARDIAC A: Persistent  sinus tachycardia-->improved; now worse again. Suspect that this reflects her volume status  P: - fluid challenge - keep even  - cont tele    RENAL A: AKI w/ worsening Uremia Volume OverLoad  Both improved w/ CRRT.  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 DVT: left peroneal vein and Right UE extending into the Subclavian -->picc removed.  Anemia -->getting PRBCs 1/30  P: Heparin gtt for PE/DVT No chemo x 13 days Cbc follow up  INFECTION A: Sepsis 2nd to post-obstructive pneumonia. Fevers resolved and elevated LA are likely secondary to tumor P: Trend fever and WBC curve   ENDOCRINE A: No acute issues. P: - monitor blood glucose on BMET  NEUROLOGY A: Cancer pain. Acute Encephalopathy. Suspect that this is r/t her AKI and uremia  R/o subclinical seizures, mets-->doubt, would favor uremia as etiology of Encephalopathy  P: - PRN Morphine (per family request) - minimize sedation   Clinically a little better on all fronts (at least in the short term). Now weaning.  I remain not convinced that the family is realistic about the goals here (extubate--->hope for comfort and quality), think they are more optimistic. We will meet w/ family to establish what to do should she fail extubation. Hope we will be ready to do this soon but she is very weak and deconditioned.   Erick Colace ACNP-BC [place] Pager # [phone] OR # [phone] if no  answer

## 2015-09-17 NOTE — Progress Notes (Signed)
Date: September 17, 2015 Chart reviewed for concurrent status and case management needs. Will continue to follow patient for changes and needs:  Continues on the vent and weaning.  Family discussion to done today about next steps. Velva Harman, BSN, [place], [place]   [phone]

## 2015-09-17 NOTE — Progress Notes (Signed)
[facility] Progress Note   Subjective: no problems with CRRT.  No UOP -  Removed 2 liters the last 24 hours with CRRT   - CVP down- is tachy and I agree may be due to getting dry-     Filed Vitals:   09/17/15 0900 09/17/15 1000 09/17/15 1100 09/17/15 1200  BP: 113/79 109/81 110/72 122/86  Pulse: 131 123 119 95  Temp:   98.1 F (36.7 C)   TempSrc:      Resp: '24 23 24 27  '$ Height:      SpO2: 97% 98% 98% 93%    Inpatient medications: . antiseptic oral rinse  7 mL Mouth Rinse QID  . budesonide (PULMICORT) nebulizer solution  0.5 mg Nebulization BID  . chlorhexidine gluconate  15 mL Mouth Rinse BID  . feeding supplement (PRO-STAT SUGAR FREE 64)  30 mL Per Tube BID  . levalbuterol  0.63 mg Nebulization Q6H  . pantoprazole sodium  40 mg Per Tube Daily  . sodium chloride  10-40 mL Intracatheter Q12H  . sodium chloride  3 mL Intravenous Q12H   . feeding supplement (NEPRO CARB STEADY) 1,000 mL (09/17/15 1047)  . heparin 1,850 Units (09/17/15 1300)  . dialysis replacement fluid (prismasate) 400 mL/hr at 09/17/15 0245  . dialysis replacement fluid (prismasate) 200 mL/hr at 09/16/15 0033  . dialysate (PRISMASATE) 2,000 mL/hr at 09/17/15 1300   alteplase, alteplase, artificial tears, heparin, heparin lock flush, heparin lock flush, hydrALAZINE, levalbuterol, loperamide, midazolam, morphine injection, [DISCONTINUED] ondansetron **OR** ondansetron (ZOFRAN) IV, polyvinyl alcohol, simethicone, sodium chloride, sodium chloride, sodium chloride flush, sodium chloride flush  Exam: More alert No jvd Chest coarse BS bilat RRR no mrg Abd soft mod distended no mass GU foley in place Ext diffuse 2-3+ ext edema x 4- left femoral cath placed 1/27 Neuro not responding   CXR RML consolidation, scattered nodular changes UA 1/21 > protein neg, 0-5 rbc/wbc's   Assessment: 1 Acute renal failure - suspect ATN, oliguric/anuric. CRRT day #6. Uremia better along with volume- to continue  CRRT for now- no heparin in machine but on heparin drip for DVTs.  Timing of trying to stop dialysis support is tricky- will wait at least until extubated- I am not sure once we stop that she will pick up UOP right away so hesitant to stop it because we do not have the luxury of allowing her volume or metabolic status to get worse even temporarily-  did get carboplatin so possibly nephrotox from that.  Am hopeful for eventual recovery of renal function if no other signifiant events and she improves on other fronts 2 Vol excess up - taking a break from UF right now given tachy - run even -  no current weights- still with peripheral edema 3 Anemia Hb 8.6 after transfusion- transfuse as needed 4 Metastatic cancer, neuroendocrine type-  SP chemo x 1 on 1/25- carboplatin- obviously a complicating factor  5 Resp failure - PNA/ nodular mets, + vol excess. FiO2 down 0.40,. CXR better w vol removal 6. Elytes- OK - all 4 K dialysate - phos down some- s/p  repletion    Monique [name] A   09/17/2015, 1:45 PM    Recent Labs Lab 09/16/15 0421 09/16/15 1405 09/17/15 0420  NA 140 139 137  K 4.2 4.0 4.3  CL 102 101 100*  CO2 '25 25 24  '$ GLUCOSE 161* 200* 163*  BUN 48* 48* 49*  CREATININE 0.95 0.95 0.91  CALCIUM 9.6 9.5 9.3  PHOS 2.8  3.6 3.4    Recent Labs Lab 09/14/15 0627  09/15/15 0510  09/16/15 0421 09/16/15 1405 09/17/15 0420  AST 248*  --  214*  --  188*  --   --   ALT 73*  --  84*  --  102*  --   --   ALKPHOS 467*  --  460*  --  561*  --   --   BILITOT 2.1*  --  1.8*  --  1.1  --   --   PROT 6.8  --  6.1*  --  7.0  --   --   ALBUMIN 2.0*  < > 1.8*  1.9*  < > 2.0*  2.0* 2.2* 2.3*  < > = values in this interval not displayed.  Recent Labs Lab 09/14/15 0627 09/15/15 0510 09/16/15 0421  WBC 13.0* 8.5 6.8  NEUTROABS  --  8.2*  --   HGB 7.5* 6.7* 8.6*  HCT 23.8* 21.2* 27.2*  MCV 85.3 85.5 86.6  PLT 280 224 221

## 2015-09-17 NOTE — Progress Notes (Addendum)
ANTICOAGULATION CONSULT NOTE - Follow up  Pharmacy Consult for heparin IV  Indication: DVT noted in the subclavian vein and axillary vein.  No Known Allergies  Patient Measurements: Height: 5' 5.5" (166.4 cm) Weight:  (patients bed scale is broken.) IBW/kg (Calculated) : 58.15 Heparin Dosing Weight: 76.8 kg  Vital Signs: Temp: 98.7 F (37.1 C) (02/01 0400) Temp Source: Oral (02/01 0400) BP: 110/90 mmHg (02/01 0700) Pulse Rate: 124 (02/01 0722)  Labs:  Recent Labs  09/15/15 0510  09/16/15 0421 09/16/15 0840 09/16/15 1405 09/17/15 0420  HGB 6.7*  --  8.6*  --   --   --   HCT 21.2*  --  27.2*  --   --   --   PLT 224  --  221  --   --   --   APTT 58*  --  53*  --   --  51*  HEPARINUNFRC 0.31  < > 0.28* 0.29* 0.41 0.35  CREATININE 0.89  < > 0.95  --  0.95 0.91  < > = values in this interval not displayed.  Estimated Creatinine Clearance: 96.4 mL/min (by C-G formula based on Cr of 0.91).  Assessment: Pt is a 40 yo female diagnosed with DVT noted in the L peroneal vein and RUE at PICC site >> PICC removed. Pt is currently intubated with acute hypoxic respiratory failure secondary to post-obstructive pneumonia, metastatic cancer, and wheeze from extrinsic airway compression.  PCCM thinks probably element of PE as well.  Hgb has been trending down recently, will monitor closely.  Pharmacy is consulted on 1/24 to start Heparin IV.  Today, 09/17/2015:  Heparin level 0.35, therapeutic on heparin at 1850units/hr  CBC:  Hgb improved on 1/31 after transfusion 1/30. Pltc stable. No new CBC on 2/1 yet.  No bleeding or complications noted except for ongoing vaginal spotting.  CRRT since 1/27   Goal of Therapy:  Heparin level 0.3-0.7 units/ml Monitor platelets by anticoagulation protocol: Yes   Plan:  Cont heparin IV infusion at 1850 units/hr Recheck confirmatory heparin level with CBC at 1400 Daily heparin level and CBC Continue to monitor H&H and platelets   Gretta Arab PharmD, BCPS Pager [phone] 09/17/2015 7:54 AM     Addendum:  Heparin level 0.3 remains therapeutic but has decreased to lower limit of goal range.  CBC:  Hgb and Plt both slightly decreased.  No bleeding or complications noted.  Note that she remains on CRRT, but running even volumes (suspect she is becoming dry with large volume removed over the past 2 days) Plan:  Increase to heparin IV infusion at 1950 units/hr (19.5 ml/hr)  Daily heparin level and CBC  Continue to monitor H&H and platelets   Gretta Arab PharmD, BCPS Pager [phone] 09/17/2015 3:04 PM

## 2015-09-17 NOTE — Consult Note (Signed)
WOC wound consult note Reason for Consult: Discolored area at coccyx with skin peeling, also noted at inner thighs.  Blanching erythema at bilateral heels, improved over yesterday in area. Critically ill patient ventilated, on dialysis. 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 Two linear areas of partial thickness tissue loss, 3cm x 0.1cm x 0.2cm with dried serum covering (scab).  FlexiSeal in place for liquid stool Wound bed:dry, lighter skin tone Drainage (amount, consistency, odor) None Periwound:intact, dry Dressing procedure/placement/frequency: Patient is on a therapeutic sleep surface with low air loss feature and is being turned side to side.  Prevalon boots in place for pressure redistribution at heels. [place] nursing team will not follow, but will remain available to this patient, the nursing and medical teams.  Please re-consult if needed. Thanks, Maudie Flakes, MSN, RN, [place], [place]  Pager# [phone]

## 2015-09-17 DEATH — deceased

## 2015-09-18 ENCOUNTER — Inpatient Hospital Stay (HOSPITAL_COMMUNITY): Payer: BC Managed Care – PPO

## 2015-09-18 DIAGNOSIS — D702 Other drug-induced agranulocytosis: Secondary | ICD-10-CM

## 2015-09-18 DIAGNOSIS — C801 Malignant (primary) neoplasm, unspecified: Secondary | ICD-10-CM | POA: Insufficient documentation

## 2015-09-18 DIAGNOSIS — D6959 Other secondary thrombocytopenia: Secondary | ICD-10-CM

## 2015-09-18 LAB — APTT: aPTT: 71 seconds — ABNORMAL HIGH (ref 24–37)

## 2015-09-18 LAB — BLOOD GAS, ARTERIAL
ACID-BASE DEFICIT: 2.5 mmol/L — AB (ref 0.0–2.0)
BICARBONATE: 29.1 meq/L — AB (ref 20.0–24.0)
DRAWN BY: 295031
FIO2: 1
O2 SAT: 96 %
PATIENT TEMPERATURE: 98.6
PO2 ART: 120 mmHg — AB (ref 80.0–100.0)
TCO2: 29.3 mmol/L (ref 0–100)
pCO2 arterial: 102 mmHg (ref 35.0–45.0)
pH, Arterial: 7.084 — CL (ref 7.350–7.450)

## 2015-09-18 LAB — RENAL FUNCTION PANEL
ALBUMIN: 2.4 g/dL — AB (ref 3.5–5.0)
ANION GAP: 12 (ref 5–15)
Albumin: 2.2 g/dL — ABNORMAL LOW (ref 3.5–5.0)
Anion gap: 10 (ref 5–15)
BUN: 45 mg/dL — AB (ref 6–20)
BUN: 40 mg/dL — ABNORMAL HIGH (ref 6–20)
CALCIUM: 9.9 mg/dL (ref 8.9–10.3)
CO2: 26 mmol/L (ref 22–32)
CO2: 28 mmol/L (ref 22–32)
CREATININE: 0.73 mg/dL (ref 0.44–1.00)
Calcium: 9.6 mg/dL (ref 8.9–10.3)
Chloride: 99 mmol/L — ABNORMAL LOW (ref 101–111)
Chloride: 103 mmol/L (ref 101–111)
Creatinine, Ser: 0.81 mg/dL (ref 0.44–1.00)
GFR calc Af Amer: >60 mL/min (ref >60)
GFR calc non Af Amer: >60 mL/min (ref >60)
GLUCOSE: 143 mg/dL — AB (ref 65–99)
Glucose, Bld: 123 mg/dL — ABNORMAL HIGH (ref 65–99)
PHOSPHORUS: 3 mg/dL (ref 2.5–4.6)
POTASSIUM: 4.8 mmol/L (ref 3.5–5.1)
Phosphorus: 5 mg/dL — ABNORMAL HIGH (ref 2.5–4.6)
Potassium: 5.3 mmol/L — ABNORMAL HIGH (ref 3.5–5.1)
SODIUM: 137 mmol/L (ref 135–145)
SODIUM: 141 mmol/L (ref 135–145)

## 2015-09-18 LAB — GLUCOSE, CAPILLARY
GLUCOSE-CAPILLARY: 138 mg/dL — AB (ref 65–99)
Glucose-Capillary: 152 mg/dL — ABNORMAL HIGH (ref 65–99)
Glucose-Capillary: 109 mg/dL — ABNORMAL HIGH (ref 65–99)
Glucose-Capillary: 114 mg/dL — ABNORMAL HIGH (ref 65–99)
Glucose-Capillary: 119 mg/dL — ABNORMAL HIGH (ref 65–99)
Glucose-Capillary: 128 mg/dL — ABNORMAL HIGH (ref 65–99)

## 2015-09-18 LAB — CBC WITH DIFFERENTIAL/PLATELET
Basophils Absolute: 0 10*3/uL (ref 0.0–0.1)
Basophils Relative: 0 %
EOS ABS: 0 10*3/uL (ref 0.0–0.7)
Eosinophils Relative: 0 %
HCT: 27.8 % — ABNORMAL LOW (ref 36.0–46.0)
Hemoglobin: 8.9 g/dL — ABNORMAL LOW (ref 12.0–15.0)
LYMPHS ABS: 0.4 10*3/uL — AB (ref 0.7–4.0)
LYMPHS PCT: 13 %
MCH: 27.1 pg (ref 26.0–34.0)
MCHC: 32 g/dL (ref 30.0–36.0)
MCV: 84.5 fL (ref 78.0–100.0)
MONOS PCT: 3 %
Monocytes Absolute: 0.1 10*3/uL (ref 0.1–1.0)
NEUTROS PCT: 84 %
Neutro Abs: 2.5 10*3/uL (ref 1.7–7.7)
PLATELETS: 137 10*3/uL — AB (ref 150–400)
RBC: 3.29 MIL/uL — AB (ref 3.87–5.11)
RDW: 14.8 % (ref 11.5–15.5)
WBC: 3 10*3/uL — AB (ref 4.0–10.5)

## 2015-09-18 LAB — MAGNESIUM: Magnesium: 2.7 mg/dL — ABNORMAL HIGH (ref 1.7–2.4)

## 2015-09-18 LAB — HEPARIN LEVEL (UNFRACTIONATED): Heparin Unfractionated: 0.57 IU/mL (ref 0.30–0.70)

## 2015-09-18 MED ORDER — FENTANYL CITRATE (PF) 100 MCG/2ML IJ SOLN: 100.0000 ug | INTRAMUSCULAR | Status: DC | PRN

## 2015-09-18 MED ORDER — CETYLPYRIDINIUM CHLORIDE 0.05 % MT LIQD: 7.0000 mL | Freq: Two times a day (BID) | OROMUCOSAL | Status: DC

## 2015-09-18 MED ORDER — FENTANYL CITRATE (PF) 100 MCG/2ML IJ SOLN
INTRAMUSCULAR | Status: AC
  Filled 2015-09-18: qty 2

## 2015-09-18 MED ORDER — CHLORHEXIDINE GLUCONATE 0.12 % MT SOLN
15.0000 mL | Freq: Two times a day (BID) | OROMUCOSAL | Status: DC
  Administered 2015-09-18: 15 mL via OROMUCOSAL

## 2015-09-18 MED ORDER — SODIUM CHLORIDE 0.9 % IV SOLN
3.0000 g | Freq: Three times a day (TID) | INTRAVENOUS | Status: DC
  Administered 2015-09-18 – 2015-09-20 (×5): 3 g via INTRAVENOUS
  Filled 2015-09-18 (×6): qty 3

## 2015-09-18 MED ORDER — ATROPINE SULFATE 0.1 MG/ML IJ SOLN
INTRAMUSCULAR | Status: AC
  Filled 2015-09-18: qty 20

## 2015-09-18 MED ORDER — SODIUM BICARBONATE 8.4 % IV SOLN
INTRAVENOUS | Status: AC
Start: 2015-09-18 — End: 2015-09-18
  Administered 2015-09-18: 100 meq
  Filled 2015-09-18: qty 200

## 2015-09-18 MED ORDER — EPINEPHRINE HCL 0.1 MG/ML IJ SOSY
PREFILLED_SYRINGE | INTRAMUSCULAR | Status: AC
  Filled 2015-09-18: qty 20

## 2015-09-18 MED ORDER — ETOMIDATE 2 MG/ML IV SOLN
20.0000 mg | Freq: Once | INTRAVENOUS | Status: AC
  Administered 2015-09-18: 20 mg via INTRAVENOUS

## 2015-09-18 MED ORDER — MIDAZOLAM HCL 2 MG/2ML IJ SOLN
INTRAMUSCULAR | Status: AC
  Filled 2015-09-18: qty 2

## 2015-09-18 MED ORDER — FENTANYL CITRATE (PF) 100 MCG/2ML IJ SOLN: 25.0000 ug | INTRAMUSCULAR | Status: DC | PRN

## 2015-09-18 NOTE — Progress Notes (Addendum)
Nutrition Follow-up  INTERVENTION:   **Addendum: Pt plan is to extubate today. TF d/c. Recommendations below if re-intubated or diet is unable to be advanced.  TF recommendations: Nepro to 40 ml/hr. 30 ml Prostat TID TF regimen provides 2028 kcal (106% of needs), 122g protein and 698 ml H20.  RD to continue to monitor  NUTRITION DIAGNOSIS:   Inadequate oral intake related to inability to eat as evidenced by NPO status.  Ongoing.  GOAL:   Patient will meet greater than or equal to 90% of their needs  Meeting.  MONITOR:   TF tolerance, Skin, Vent status, Labs, I & O's , GOC  ASSESSMENT:   Presented with about the month worsens worsening shortness of breath and wheezing. She has been seen for this by her primary care provider diagnosed with pneumonia based on chest x-ray and completed antibiotics is not improvement repeat chest x-ray showed persistent pneumonia she had a total three courses of antibiotics Including z-pack, levaquin and clarithromycin. Suspect that she has a malignancy of sort, however, it is unclear what the primary is;  Multi-compartmental masses (chest, adenopathy, ovarian).   Pt continues on CRRT. Pt to be extubated today. Pt has lost another 2L from CRRT. Weight has significantly decreased from fluid losses, -48 lb. Re-estimated needs with current weight and vent settings. Adjusted TF order to meet patients needs.  Patient is currently intubated on ventilator support since 1/19. MV: 11.3 L/min Temp (24hrs), Avg:98.2 F (36.8 C), Min:97.2 F (36.2 C), Max:98.9 F (37.2 C)  Labs reviewed: CBGs: 138-152 Elevated BUN, Mg Phos WNL  Diet Order:  Diet NPO time specified  Skin:  Reviewed, no issues  Last BM:  1/29  Height:   Ht Readings from Last 1 Encounters:  09/13/15 5' 5.5" (1.664 m)    Weight:   Wt Readings from Last 1 Encounters:  09/18/15 169 lb 5 oz (76.8 kg)    Ideal Body Weight:  57.95 kg (kg)  BMI:  Body mass index is 27.74  kg/(m^2).  Estimated Nutritional Needs:   Kcal:  2065  Protein:  120-130g  Fluid:  >/= 2 L/day  EDUCATION NEEDS:   No education needs identified at this time  Clayton Bibles, MS, RD, LDN Pager: [phone] After Hours Pager: [phone]

## 2015-09-18 NOTE — Progress Notes (Signed)
ANTICOAGULATION CONSULT NOTE - Follow up  Pharmacy Consult for heparin IV  Indication: Subclavian vein and axillary vein DVT  No Known Allergies  Patient Measurements: Height: 5' 5.5" (166.4 cm) Weight: 169 lb 5 oz (76.8 kg) IBW/kg (Calculated) : 58.15 Heparin Dosing Weight: 76.8 kg  Vital Signs: Temp: 98.2 F (36.8 C) (02/02 0400) Temp Source: Oral (02/02 0400) BP: 120/69 mmHg (02/02 0600) Pulse Rate: 109 (02/02 0600)  Labs:  Recent Labs  09/16/15 0421  09/17/15 0420 09/17/15 1410 09/17/15 1600 09/18/15 0510  HGB 8.6*  --   --  8.4*  --  8.9*  HCT 27.2*  --   --  26.2*  --  27.8*  PLT 221  --   --  175  --  137*  APTT 53*  --  51*  --   --  71*  HEPARINUNFRC 0.28*  < > 0.35 0.30  --  0.57  CREATININE 0.95  < > 0.91  --  0.79 0.81  < > = values in this interval not displayed.  Estimated Creatinine Clearance: 95.6 mL/min (by C-G formula based on Cr of 0.81).  Assessment: Pt is a 40 yo female diagnosed with DVT noted in the L peroneal vein and RUE at PICC site >> PICC removed. Pt is currently intubated with acute hypoxic respiratory failure secondary to post-obstructive pneumonia, metastatic cancer, and wheeze from extrinsic airway compression.  PCCM thinks probably element of PE as well.  Hgb has been trending down recently, will monitor closely.  Pharmacy is consulted on 1/24 to start Heparin IV.  Today, 09/18/2015:  Heparin level 0.57, therapeutic and increased on heparin at 1950 units/hr  CBC:  Hgb improved to 8.9, Plt decreased to 137.  No bleeding or complications noted except for ongoing vaginal spotting.  CRRT started 1/27; she was changed on 2/1 afternoon to running even volumes (suspect she was becoming dry with large volume removed over the past 2 days)   Goal of Therapy:  Heparin level 0.3-0.7 units/ml Monitor platelets by anticoagulation protocol: Yes   Plan:  Cont heparin IV infusion at 1950 units/hr Recheck confirmatory heparin level at 1700 Daily  heparin level and CBC Continue to monitor H&H and platelets   Gretta Arab PharmD, BCPS Pager [phone] 09/18/2015 7:02 AM

## 2015-09-18 NOTE — Progress Notes (Signed)
   09/18/15 1100  Clinical Encounter Type  Visited With Patient and family together  Visit Type Follow-up;Psychological support;Spiritual support;Critical Care  Referral From Nurse  Consult/Referral To Chaplain  Spiritual Encounters  Spiritual Needs Emotional;Other (Comment) (Pastoral Support)  Stress Factors  Patient Stress Factors Health changes  Family Stress Factors None identified   I followed-up with the patient and her family today. The patient was awake and sitting up in bed. The family were present at her bedside. The brother stated that it was a shock at how well she was doing today. The husband said that they couldn't be happier about seeing her awake and alert and communicating.  The patient nodded that she remembered me from the other day.  The family talked about how enthusiastic the patient's son is about seeing his mom today and seeing her awake.  They are still hoping for the best and optimistic today.  We will continue to follow-up and provide support for this family.    [place] M.Div.

## 2015-09-18 NOTE — Progress Notes (Signed)
PCCM PROGRESS NOTE  ADMISSION DATE: 09/10/2015 CONSULT DATE: 08/30/2015 REFERRING PROVIDER: Dr. Roel Cluck, [facility]  CC: Short of breath  SUBJECTIVE:  Weaning, tries to f/c. Profoundly weak   VITAL SIGNS: BP 122/43 mmHg  Pulse 121  Temp(Src) 97.2 F (36.2 C) (Oral)  Resp 25  Ht 5' 5.5" (1.664 m)  Wt 169 lb 5 oz (76.8 kg)  BMI 27.74 kg/m2  SpO2 92%  LMP 09/06/2015  INTAKE/OUTPUT: I/O last 3 completed shifts: In: 2852.8 [I.V.:811.8; Other:71; GY/FV:4944] Out: 9675 [Urine:39; Other:3179; Stool:525]  General:now awake, follows commands. Responds appropriately, a little stronger today  HEENT: ETT, No JVD Cardiac: s1 s2 regular, No MRG Chest: coarse BS, rhonchi w/ diffuse wheeze improved some again today  Abd: soft exam. + BS, no r/g, abdo wall edema Ext: 3+ edema lowers, uppers 2 plus Neuro: Now awake and follows commands, stronger. But right weaker than left.  Skin: no rashes, anasarca improving   CBC Recent Labs     09/16/15  0421  09/17/15  1410  09/18/15  0510  WBC  6.8  3.6*  3.0*  HGB  8.6*  8.4*  8.9*  HCT  27.2*  26.2*  27.8*  PLT  221  175  137*    Coag's Recent Labs     09/16/15  0421  09/17/15  0420  09/18/15  0510  APTT  53*  51*  71*    BMET Recent Labs     09/17/15  0420  09/17/15  1600  09/18/15  0510  NA  137  138  137  K  4.3  4.2  4.8  CL  100*  102  99*  CO2  '24  26  26  '$ BUN  49*  44*  45*  CREATININE  0.91  0.79  0.81  GLUCOSE  163*  142*  143*    Electrolytes Recent Labs     09/16/15  0421   09/17/15  0420  09/17/15  1600  09/18/15  0510  CALCIUM  9.6   < >  9.3  9.1  9.6  MG  2.7*   --   2.8*   --   2.7*  PHOS  2.8   < >  3.4  3.3  3.0   < > = values in this interval not displayed.    Sepsis Markers No results for input(s): PROCALCITON, O2SATVEN in the last 72 hours.  Invalid input(s): LACTICACIDVEN  ABG No results for input(s): PHART, PCO2ART, PO2ART in the last 72 hours.  Liver Enzymes Recent Labs      09/16/15  0421   09/17/15  0420  09/17/15  1600  09/18/15  0510  AST  188*   --    --    --    --   ALT  102*   --    --    --    --   ALKPHOS  561*   --    --    --    --   BILITOT  1.1   --    --    --    --   ALBUMIN  2.0*  2.0*   < >  2.3*  2.2*  2.2*   < > = values in this interval not displayed.    Cardiac Enzymes No results for input(s): TROPONINI, PROBNP in the last 72 hours.  Glucose Recent Labs     09/17/15  1151  09/17/15  1640  09/17/15  2011  09/17/15  2355  09/18/15  0441  09/18/15  0826  GLUCAP  126*  139*  137*  140*  138*  152*    Imaging Dg Chest Port 1 View  09/18/2015  CLINICAL DATA:  Acute respiratory failure. On ventilator. Pneumonia. Metastatic carcinoma of unknown primary. EXAM: PORTABLE CHEST 1 VIEW COMPARISON:  09/15/2015 FINDINGS: Support lines and tubes in appropriate position. No pneumothorax visualized. Multiple pulmonary nodules are again seen, consistent with pulmonary metastases. Increased consolidation and associated pleural effusions seen in the right lung base. Heart size remains stable. IMPRESSION: Increased consolidation and pleural effusion in right lung base. Multiple pulmonary metastases again noted. Electronically Signed   By: Earle Gell M.D.   On: 09/18/2015 09:24  worsening aeration   CULTURES: 1/13 Blood >> neg 1/14 Pneumococcal Ag >> negative 1/14 Legionella Ag >> negative 1/15 C diff PCR >> negative 1/22 BAL>>>yeast candida only 5 k BCX2 1/21>>>neg  ANTIBIOTICS: 1/13 Rocephin >> 1/13 1/13 Zithromax >> 1/13 1/14 Vancomycin >> 1/19, restart 1/21>>>off 1/14 Zosyn >> 1/20. 1/21 Ceftaz >>>off  LINES/TUBES: 1/15 Rt PICC >> 1/24 Left IJ CVL 1/24>>> HD cath 1/27>>>  STUDIES: 1/13 CT chest >> multiple b/l nodules, mass like consolidation Rt mid lung, Rt hilar and subcarinal mass, Lt hilar LAN, 2.7 cm Rt paratracheal LAN 1/14 CT abd/pelvis >> 10 cm mass superior to uterus 1/15 Tumor markers >> CA 19-9 453, CEA 3.9, CA 125  780.8 1/16 Bronchoscopy >> crush artifact ?small cell lung cancer (non diagnostic)  EVENTS: 1/13 Admit 1/15 Gyn oncology consulted 1/16 Bronchoscopy in ICU; Placed on BiPAP for wheezing, dyspnea 1/19 IR biopsy>>>Poorly differentiated high Grade Carcinoma.  >stains favor Neuroendocrine. TTF stain was NEGATIVE 1/22 bronch cytology: neg  1/28 EEG>>> neg 1/31 weaning  2/1: gave fluid challenge and changed to even volume status.  2/2 passed SBT.   ASSESSMENT/PLAN:  PULMONARY A: Acute hypoxic respiratory failure 2nd to post-obstructive pneumonia, metastatic cancer, and wheeze from extrinsic airway compression. Probably element of PE improved Clinically improving some,  Passed SBT but she is very weak. Concerned that her cough mechanics and strength may be an issue  P: - hep drip - hope to extubate today - keep even vol status - pulm hygiene  - discuss w/ patient re: re-intubation vs DNI   CARDIAC A: Persistent  sinus tachycardia-->improved; now worse again. Suspect that this reflects her volume status as it is better now   P:  - keep even  - cont tele    RENAL A: AKI w/ worsening Uremia Volume OverLoad  Both improved w/ CRRT.  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 DVT: left peroneal vein and Right UE extending into the Subclavian -->picc removed.  Anemia -->getting PRBCs 1/30  P: Heparin gtt for PE/DVT No chemo x 12 days Cbc follow up  INFECTION A: Sepsis 2nd to post-obstructive pneumonia. Fevers resolved and elevated LA are likely secondary to tumor P: Trend fever and WBC curve   ENDOCRINE A: No acute  issues. P: - monitor blood glucose on BMET  NEUROLOGY A: Cancer pain. Acute Encephalopathy. Suspect that this is r/t her AKI and uremia  R/o subclinical seizures, mets-->doubt, would favor uremia as etiology  of Encephalopathy  P: - PRN Morphine (per family request) - minimize sedation   Clinically a little better on all fronts (at least in the short term). Now weaning.  Stronger--> probably ready for extubation as passed SBT. Remain concerned about how she does after we extubate. We will need to discuss this w/ her after. As of now her husband would want her re-intubated.   Erick Colace ACNP-BC [place] Pager # [phone] OR # [phone] if no answer

## 2015-09-18 NOTE — Progress Notes (Signed)
eLink Physician-Brief Progress Note Patient Name: Monique [name] DOB: 1976/07/16 MRN: [id number]   Date of Service  09/18/2015  HPI/Events of Note  Case discussed with husband at length again.  She has a severe respiratory acidosis pH 7.08/pCO2> 100 I explained risk of cardiac arrest is exceedingly high.  I explained that CPR would be harmful and not provide medical benefit but that with the current plan he desires (BIPAP prior to intubation which is inevitable despite my recommendation for immediate intubation) that the risk of cardiac arrest is very high.  He voiced understanding, wishes for her code status to be full code because he thinks she would want to do anything to "keep kicking".  I explained that both CPR and mechanical ventilation will not be medically beneficial but he desires full code.  eICU Interventions  Code status FULL Code BIPAP  Monitor closely, will likely need intubation     Intervention Category Major Interventions: Respiratory failure - evaluation and management  Simonne Maffucci 09/18/2015, 6:17 PM

## 2015-09-18 NOTE — Procedures (Signed)
Extubation Procedure Note  Patient Details:   Name: DETRA BORES DOB: June 14, 1976 MRN: [id number]   Airway Documentation:     Evaluation  O2 sats: 88 Complications: none Patient tolerated procedure well. Bilateral Breath Sounds: Rhonchi Suctioning: Oral, Airway Able to whisper name.  Per CCM order, pt extubated, placed on 6L nasal cannula.  Pt tolerated well, no complications.  Goal for sats high 80's to 90.  Martha Clan 09/18/2015, 10:53 AM

## 2015-09-18 NOTE — Progress Notes (Signed)
eLink Physician-Brief Progress Note Patient Name: Monique [name] DOB: 07-Sep-1975 MRN: [id number]   Date of Service  09/18/2015  HPI/Events of Note  Respiratory failure no better PH 7.08/pCO2 98 Weak/no cough, rhonchi, increased work of breathing on BIPAP, no meaningful improvement   eICU Interventions  Needs intubation, will have on ground partner re-intubate     Intervention Category Major Interventions: Respiratory failure - evaluation and management  Simonne Maffucci 09/18/2015, 7:38 PM

## 2015-09-18 NOTE — Progress Notes (Signed)
At 1030 patient's tube feeding was turned off and tube was placed to suction in anticipation of removing the ET tube. Tube feeding is now off due to removal to the patient's OG tube. I have adjusted patient fluid removal rate on CRRT machine to reflect the change in patient input. At 1100 patient was -125 for hourly balance. This is the amount of fluid that was removed when OG was changed to suction.

## 2015-09-18 NOTE — Procedures (Signed)
Intubation Procedure Note [place] DEVINE [id number] 1975/09/06  Procedure: Intubation Indications: Respiratory insufficiency  Procedure Details Consent: Risks of procedure as well as the alternatives and risks of each were explained to the (patient/caregiver).  Consent for procedure obtained. - d/w Husband prior to intubation of risks opf cardiac arrest, circulatory shock, oral bleeding, false intubation, aspiration. Currently comatos with   Recent Labs Lab 09/13/15 0844 09/13/15 1209 09/18/15 1737 09/18/15 1920  PHART 7.391 7.409 7.084* 7.087*  PCO2ART 46.3* 43.8 102* 99.6*  PO2ART 64.3* 75.4* 120* 111*  HCO3 28.0* 27.2* 29.1* 28.6*  TCO2 26.9 25.9 29.3 28.9  O2SAT 91.0 93.7 96.0 95.4  and defintely needs reitnubation. Benefit outweighs risk despite high risk intubatio  Time Out: Verified patient identification, verified procedure, site/side was marked, verified correct patient position, special equipment/implants available, medications/allergies/relevent history reviewed, required imaging and test results available.  Performed  Maximum sterile technique was used including cap, gloves, gown, hand hygiene and mask.  MAC  AFter '20mg'$  etomidate given patient vomited (prior to that RN confirmed that patient was NPO and stomach had been decompressed in day time prior to extubation. No NG tube prior to intubatino because she was on bipap)  Evaluation Hemodynamic Status: Persistent hypotension treated with fluid; O2 sats: stable throughout Patient's Current Condition: stable Complications: Complications of ASPIRATION (husband was warned of this prior to admit) Patient did not tolerate procedure well. Chest X-ray ordered to verify placement.  CXR: pending.   PLAN = unasyn for aspiratio - prn fentanyl --vent ordedrs - abg in 30 min -call to box  [facility] 09/18/2015

## 2015-09-18 NOTE — Progress Notes (Signed)
eLink Physician-Brief Progress Note Patient Name: SOMMER SPICKARD DOB: Dec 19, 1975 MRN: [id number]   Date of Service  09/18/2015  HPI/Events of Note  RN called due to tachypnea, now improved somewhat but confused, hypoxemic requiring non-re-breather They also asked to clarify code status  The patient's husband indicates that his wife wants to be re-intubated. I have discussed this with the patient and her husband today and asked them to talk about this specifically as her overall prognosis is very poor.  eICU Interventions  Change code status to limited code, no CPR Check ABG     Intervention Category Major Interventions: Code management / supervision;End of life / care limitation discussion;Respiratory failure - evaluation and management  Simonne Maffucci 09/18/2015, 4:11 PM

## 2015-09-18 NOTE — Progress Notes (Signed)
Date: September 18, 2015 Chart reviewed for concurrent status and case management needs. Will continue to follow patient for changes and needs: continues on vent and crrt Velva Harman, BSN, [place], [place]   [phone]

## 2015-09-18 NOTE — Progress Notes (Signed)
eLink Physician-Brief Progress Note Patient Name: DEONDREA AGUADO DOB: Aug 26, 1975 MRN: [id number]   Date of Service  09/18/2015  HPI/Events of Note  Respiratory failure, mental status worse Brother concerned about her situation I discussed her case with the nurse and with her husband through the camera. I explained to her husband that I feel she needs to be intubated due to inability to clear secretions.  He is very resistant to this and wants [place] to try BIPAP.  I explained that she will not be able to clear secretions and the likelihood of respiratory failure is high.  eICU Interventions  ABG now BIPAP now     Intervention Category Major Interventions: Respiratory failure - evaluation and management  Simonne Maffucci 09/18/2015, 5:45 PM

## 2015-09-18 NOTE — Progress Notes (Signed)
Monique [name] Progress Note   Subjective: no problems with CRRT.  Running even-  No UOP -    Filed Vitals:   09/18/15 0835 09/18/15 0845 09/18/15 0900 09/18/15 0915  BP:   122/43   Pulse:  115 79 121  Temp:      TempSrc:      Resp:  '26 22 25  '$ Height:      Weight:      SpO2: 92% 93% 82% 92%    Inpatient medications: . antiseptic oral rinse  7 mL Mouth Rinse q12n4p  . antiseptic oral rinse  7 mL Mouth Rinse QID  . budesonide (PULMICORT) nebulizer solution  0.5 mg Nebulization BID  . chlorhexidine  15 mL Mouth Rinse BID  . levalbuterol  0.63 mg Nebulization Q6H  . pantoprazole sodium  40 mg Per Tube Daily  . sodium chloride  10-40 mL Intracatheter Q12H  . sodium chloride  3 mL Intravenous Q12H   . heparin 1,950 Units/hr (09/18/15 1000)  . dialysis replacement fluid (prismasate) 400 mL/hr at 09/18/15 0437  . dialysis replacement fluid (prismasate) 200 mL/hr at 09/18/15 0450  . dialysate (PRISMASATE) 2,000 mL/hr at 09/18/15 0952   alteplase, alteplase, artificial tears, heparin, heparin lock flush, heparin lock flush, hydrALAZINE, levalbuterol, loperamide, morphine injection, [DISCONTINUED] ondansetron **OR** ondansetron (ZOFRAN) IV, polyvinyl alcohol, simethicone, sodium chloride, sodium chloride, sodium chloride flush, sodium chloride flush  Exam: More alert No jvd Chest coarse BS bilat RRR no mrg Abd soft mod distended no mass GU foley in place Ext diffuse 2-3+ ext edema x 4- left femoral cath placed 1/27 Neuro not responding   CXR RML consolidation, scattered nodular changes UA 1/21 > protein neg, 0-5 rbc/wbc's   Assessment: 1 Acute renal failure - suspect ATN, oliguric/anuric. CRRT day #7. Uremia better along with volume- to continue CRRT for now- no heparin in machine but on heparin drip for DVTs.  Timing of trying to stop dialysis support is very tricky- now is extubated- I am not sure once we stop that she will pick up UOP right away so hesitant to  stop it because we do not have the luxury of allowing her volume or metabolic status to get worse even temporarily- but I do know it will need to be stopped eventually-  did get carboplatin so possibly nephrotox from that.  Am hopeful for eventual recovery of renal function if no other signifiant events and she improves on other fronts 2 Vol excess up - taking a break from UF right now given tachy - run even -  no current weights- still with peripheral edema but seems OK 3 Anemia Hb 8.9 after transfusion- transfuse as needed 4 Metastatic cancer, neuroendocrine type-  SP chemo x 1 on 1/25- carboplatin- obviously a complicating factor  5 Resp failure - PNA/ nodular mets, + vol excess. FiO2 down 0.40,. CXR better w vol removal 6. E[place]tes- OK - all 4 K dialysate - phos down some- s/p  repletion    Sabastien Tyler A   09/18/2015, 11:28 AM    Recent Labs Lab 09/17/15 0420 09/17/15 1600 09/18/15 0510  NA 137 138 137  K 4.3 4.2 4.8  CL 100* 102 99*  CO2 '24 26 26  '$ GLUCOSE 163* 142* 143*  BUN 49* 44* 45*  CREATININE 0.91 0.79 0.81  CALCIUM 9.3 9.1 9.6  PHOS 3.4 3.3 3.0    Recent Labs Lab 09/14/15 0627  09/15/15 0510  09/16/15 0421  09/17/15 0420 09/17/15 1600 09/18/15 0510  AST  248*  --  214*  --  188*  --   --   --   --   ALT 73*  --  84*  --  102*  --   --   --   --   ALKPHOS 467*  --  460*  --  561*  --   --   --   --   BILITOT 2.1*  --  1.8*  --  1.1  --   --   --   --   PROT 6.8  --  6.1*  --  7.0  --   --   --   --   ALBUMIN 2.0*  < > 1.8*  1.9*  < > 2.0*  2.0*  < > 2.3* 2.2* 2.2*  < > = values in this interval not displayed.  Recent Labs Lab 09/15/15 0510 09/16/15 0421 09/17/15 1410 09/18/15 0510  WBC 8.5 6.8 3.6* 3.0*  NEUTROABS 8.2*  --   --  2.5  HGB 6.7* 8.6* 8.4* 8.9*  HCT 21.2* 27.2* 26.2* 27.8*  MCV 85.5 86.6 85.6 84.5  PLT 224 221 175 137*

## 2015-09-18 NOTE — Progress Notes (Signed)
Pharmacy Antibiotic Note  Monique [name] is a 40 y.o. female admitted on 08/28/2015 with pneumonia.  Pharmacy has been consulted for Unasyn dosing.  Plan: Unasyn 3gm IV q8h (CVVHD dosing)  F/U clinical course, renal fxn  Height: 5' 5.5" (166.4 cm) Weight: 169 lb 5 oz (76.8 kg) IBW/kg (Calculated) : 58.15  Temp (24hrs), Avg:97.8 F (36.6 C), Min:97.2 F (36.2 C), Max:98.6 F (37 C)   Recent Labs Lab 09/14/15 0627  09/15/15 0510  09/16/15 0421 09/16/15 1405 09/17/15 0420 09/17/15 1410 09/17/15 1600 09/18/15 0510 09/18/15 1727  WBC 13.0*  --  8.5  --  6.8  --   --  3.6*  --  3.0*  --   CREATININE 1.30*  < > 0.89  < > 0.95 0.95 0.91  --  0.79 0.81 0.73  < > = values in this interval not displayed.  Estimated Creatinine Clearance: 96.8 mL/min (by C-G formula based on Cr of 0.73).    No Known Allergies  Antimicrobials this admission: 1/13 >> Rocephin >> 1/13 1/13 >> Zithromax >> 1/13 1/14 >> Vanc >> 1/19, 1/21 >> Vanc >> 1/25 1/14 >> Zosyn >> 1/20 1/21 >> Fortaz >> 1/31 1/28 >> Diflucan >> 1/31 2/2>>Unasyn>>  Dose adjustments this admission: 1/15 1900 VT: 15 on 1g IV q8h 1/18 1130 VT: 15 on 1g IV q8h 1/24 1500 VT: 36 on '750mg'$  IV q8h = hold Vanc. Decrease ceftazidime to 1g IV q12h.  1/25 0500 Vrandom: 28. Ke ~0.017, T1/2 ~41hrs. Vanc dc'd by CCM.  Microbiology results: 1/14 HIV screen: neg  1/14 S. pneumo UAg: neg  1/14 Legionella UAg: neg  1/14 MRSA PCR: neg  1/15 Cdiff PCR: neg  1/13 blood x2: NGF  1/21 Trach asp: moderate yeast c/w candida, final(insignificant per CCM)  1/21 blood x2: NGF  1/22 bronch washings: 5K colonies/ml, Candida, final(insignificant per CCM)   Thank you for allowing pharmacy to be a part of this patient's care.  Biagio Borg 09/18/2015 8:31 PM

## 2015-09-18 NOTE — Progress Notes (Signed)
IP PROGRESS NOTE  Subjective:   She is alert this morning. Denies pain. Multiple family members are at the bedside.  Objective: Vital signs in last 24 hours: Blood pressure 122/43, pulse 121, temperature 97.2 F (36.2 C), temperature source Oral, resp. rate 25, height 5' 5.5" (1.664 m), weight 169 lb 5 oz (76.8 kg), last menstrual period 09/16/2015, SpO2 92 %.  Intake/Output from previous day: 02/01 0701 - 02/02 0700 In: 1897.8 [I.V.:516.8; NG/GT:1310] Out: 1921 [Urine:29; Stool:425]  Physical Exam:  HEENT: ET tube in place Lungs: Clear anteriorly Abdomen: Soft and nontender Extremities: No edema Neurologic: Opens eyes, follows commands, moves all extremities to command, denies pain  Left IJ catheter site without erythema  Lab Results:  Recent Labs  09/17/15 1410 09/18/15 0510  WBC 3.6* 3.0*  HGB 8.4* 8.9*  HCT 26.2* 27.8*  PLT 175 137*   ANC 2.5   BMET  Recent Labs  09/17/15 1600 09/18/15 0510  NA 138 137  K 4.2 4.8  CL 102 99*  CO2 26 26  GLUCOSE 142* 143*  BUN 44* 45*  CREATININE 0.79 0.81  CALCIUM 9.1 9.6    Studies/Results: Dg Chest Port 1 View  09/18/2015  CLINICAL DATA:  Acute respiratory failure. On ventilator. Pneumonia. Metastatic carcinoma of unknown primary. EXAM: PORTABLE CHEST 1 VIEW COMPARISON:  09/15/2015 FINDINGS: Support lines and tubes in appropriate position. No pneumothorax visualized. Multiple pulmonary nodules are again seen, consistent with pulmonary metastases. Increased consolidation and associated pleural effusions seen in the right lung base. Heart size remains stable. IMPRESSION: Increased consolidation and pleural effusion in right lung base. Multiple pulmonary metastases again noted. Electronically Signed   By: Earle Gell M.D.   On: 09/18/2015 09:24    Medications: I have reviewed the patient's current medications.  Assessment/Plan:  1. Metastatic carcinoma with a dominant right lung mass, bilateral lung masses,  chest/abdominal lymphadenopathy, and a pelvic mass  Bronchoscopy 09/01/2015 revealed endobronchial lesions at the right upper lobe and right middle lobe, biopsies revealed malignant cells without a definitive diagnosis  CT-guided biopsy of a respiratory lymph node 09/04/2015 confirmed poorly differential carcinoma, with some features of a neuroendocrine carcinoma.  Cycle 1 etoposide/carboplatin 09/10/2015  2. Respiratory failure secondary to #1 and pneumonia  3. Renal failure-maintained on CRRT day #4  4. Left leg and right upper extremity DVTs 09/09/2015  5.  Altered mental status secondary to uremia and respiratory failure-improved  6.  Anemia secondary to phlebotomy, metastatic carcinoma, and renal failure, status post a red cell transfusion 09/15/2015  7.  Leukopenia/thrombocytopenia secondary to chemotherapy  Ms. Amer is now at day 9 following a first cycle of etoposide/carboplatin. She has developed mild leukopenia/thrombocytopenia secondary to chemotherapy.  Her mental status is significantly improved. The fever has resolved. I am hopeful this indicates a response to the chemotherapy.  She remains critically ill with multiorgan failure. I discussed the situation with her family. The plan is to administer a second cycle of chemotherapy in 2 weeks if her renal function and performance status improve.  Recommendations: 1. Continue management of respiratory failure per critical care medicine 2. CRRT per nephrology 3. Monitor CBC 4. Plan for a second cycle of etoposide/carboplatin if the respiratory failure and renal failure improve    LOS: 19 days   Nicolle Heward  09/18/2015, 10:08 AM

## 2015-09-18 NOTE — Progress Notes (Signed)
eLink Physician-Brief Progress Note Patient Name: Monique [name] DOB: 25-Nov-1975 MRN: [id number]   Date of Service  09/18/2015  HPI/Events of Note  Patient dropped sats and brady episode.  Tried recruitment maneuver without success but then resp therapy bagged patient up to 100%.  Resp therapy also increased peep to 8 briefly.  ABG 7.37/45/136/26.  Husband at bedside is concerned about the use of Fentanyl at 100 mcg doses.  Apparently patient difficult to wake after use of propofol and fentanyl.  Would like for PCCM to consider morphine.  Review of med record shows that at various times patient received propofol, benzos such as ativan and versed along with fentanyl and morphine.    eICU Interventions  Plan: For now will try PRN doses of fentanyl at a range from 25 mcg to 100 mcg q2 hours. If this does not keep patient adequately sedated will consider cont fentanyl gtt without benzos since tissue accumulation of benzos might be an issue with allowing the patient to be responsive when sedation is held.     Intervention Category Intermediate Interventions: Other:;Pain - evaluation and management;Respiratory distress - evaluation and management  [name],[name] 09/18/2015, 11:38 PM

## 2015-09-19 ENCOUNTER — Other Ambulatory Visit (HOSPITAL_COMMUNITY): Payer: BC Managed Care – PPO

## 2015-09-19 ENCOUNTER — Inpatient Hospital Stay (HOSPITAL_COMMUNITY): Payer: BC Managed Care – PPO

## 2015-09-19 DIAGNOSIS — C3431 Malignant neoplasm of lower lobe, right bronchus or lung: Secondary | ICD-10-CM

## 2015-09-19 DIAGNOSIS — C3490 Malignant neoplasm of unspecified part of unspecified bronchus or lung: Secondary | ICD-10-CM | POA: Insufficient documentation

## 2015-09-19 LAB — RENAL FUNCTION PANEL
ALBUMIN: 2 g/dL — AB (ref 3.5–5.0)
ALBUMIN: 2.1 g/dL — AB (ref 3.5–5.0)
ANION GAP: 11 (ref 5–15)
ANION GAP: 12 (ref 5–15)
BUN: 31 mg/dL — ABNORMAL HIGH (ref 6–20)
BUN: 30 mg/dL — ABNORMAL HIGH (ref 6–20)
CALCIUM: 9.2 mg/dL (ref 8.9–10.3)
CALCIUM: 8.8 mg/dL — AB (ref 8.9–10.3)
CO2: 27 mmol/L (ref 22–32)
CO2: 26 mmol/L (ref 22–32)
Chloride: 102 mmol/L (ref 101–111)
Chloride: 99 mmol/L — ABNORMAL LOW (ref 101–111)
Creatinine, Ser: 0.71 mg/dL (ref 0.44–1.00)
Creatinine, Ser: 0.71 mg/dL (ref 0.44–1.00)
GLUCOSE: 110 mg/dL — AB (ref 65–99)
GLUCOSE: 143 mg/dL — AB (ref 65–99)
PHOSPHORUS: 2.8 mg/dL (ref 2.5–4.6)
PHOSPHORUS: 3 mg/dL (ref 2.5–4.6)
POTASSIUM: 4.3 mmol/L (ref 3.5–5.1)
POTASSIUM: 4.1 mmol/L (ref 3.5–5.1)
SODIUM: 137 mmol/L (ref 135–145)
Sodium: 140 mmol/L (ref 135–145)

## 2015-09-19 LAB — BLOOD GAS, ARTERIAL
ACID-BASE DEFICIT: 2.7 mmol/L — AB (ref 0.0–2.0)
ACID-BASE EXCESS: 1.1 mmol/L (ref 0.0–2.0)
BICARBONATE: 26.2 meq/L — AB (ref 20.0–24.0)
Bicarbonate: 28.6 mEq/L — ABNORMAL HIGH (ref 20.0–24.0)
DRAWN BY: 42624
Drawn by: 426241
FIO2: 1
FIO2: 1
LHR: 30 {breaths}/min
Mode: POSITIVE
O2 SAT: 95.4 %
O2 SAT: 98.5 %
PEEP/CPAP: 5 cmH2O
PEEP: 6 cmH2O
PH ART: 7.087 — AB (ref 7.350–7.450)
PH ART: 7.378 (ref 7.350–7.450)
PO2 ART: 111 mmHg — AB (ref 80.0–100.0)
PRESSURE CONTROL: 10 cmH2O
Patient temperature: 98.6
Patient temperature: 98.6
RATE: 20 resp/min
TCO2: 28.9 mmol/L (ref 0–100)
TCO2: 23.2 mmol/L (ref 0–100)
VT: 450 mL
pCO2 arterial: 99.6 mmHg (ref 35.0–45.0)
pCO2 arterial: 45.5 mmHg — ABNORMAL HIGH (ref 35.0–45.0)
pO2, Arterial: 136 mmHg — ABNORMAL HIGH (ref 80.0–100.0)

## 2015-09-19 LAB — CBC
HCT: 26.4 % — ABNORMAL LOW (ref 36.0–46.0)
HEMOGLOBIN: 8.4 g/dL — AB (ref 12.0–15.0)
MCH: 27.5 pg (ref 26.0–34.0)
MCHC: 31.8 g/dL (ref 30.0–36.0)
MCV: 86.6 fL (ref 78.0–100.0)
PLATELETS: 102 10*3/uL — AB (ref 150–400)
RBC: 3.05 MIL/uL — AB (ref 3.87–5.11)
RDW: 15.1 % (ref 11.5–15.5)
WBC: 1.3 10*3/uL — AB (ref 4.0–10.5)

## 2015-09-19 LAB — DIFFERENTIAL
BASOS PCT: 0 %
Basophils Absolute: 0 10*3/uL (ref 0.0–0.1)
EOS ABS: 0 10*3/uL (ref 0.0–0.7)
EOS PCT: 0 %
Lymphocytes Relative: 34 %
Lymphs Abs: 0.4 10*3/uL — ABNORMAL LOW (ref 0.7–4.0)
MONO ABS: 0.1 10*3/uL (ref 0.1–1.0)
Monocytes Relative: 5 %
NEUTROS ABS: 0.8 10*3/uL — AB (ref 1.7–7.7)
NEUTROS PCT: 61 %

## 2015-09-19 LAB — APTT: APTT: 96 s — AB (ref 24–37)

## 2015-09-19 LAB — MAGNESIUM: MAGNESIUM: 2.4 mg/dL (ref 1.7–2.4)

## 2015-09-19 LAB — LIPASE, BLOOD: LIPASE: 24 U/L (ref 11–51)

## 2015-09-19 LAB — GLUCOSE, CAPILLARY
GLUCOSE-CAPILLARY: 102 mg/dL — AB (ref 65–99)
GLUCOSE-CAPILLARY: 138 mg/dL — AB (ref 65–99)

## 2015-09-19 LAB — HEPARIN LEVEL (UNFRACTIONATED)
Heparin Unfractionated: 0.79 IU/mL — ABNORMAL HIGH (ref 0.30–0.70)
Heparin Unfractionated: 0.69 IU/mL (ref 0.30–0.70)
Heparin Unfractionated: 0.67 IU/mL (ref 0.30–0.70)

## 2015-09-19 MED ORDER — NEPRO/CARBSTEADY PO LIQD
1000.0000 mL | ORAL | Status: DC
  Filled 2015-09-19: qty 1000

## 2015-09-19 MED ORDER — CHLORHEXIDINE GLUCONATE 0.12 % MT SOLN
15.0000 mL | Freq: Two times a day (BID) | OROMUCOSAL | Status: DC
Start: 2015-09-19 — End: 2015-09-30
  Administered 2015-09-19 – 2015-09-30 (×21): 15 mL via OROMUCOSAL
  Filled 2015-09-19 (×6): qty 15

## 2015-09-19 MED ORDER — METOPROLOL TARTRATE 1 MG/ML IV SOLN
2.5000 mg | INTRAVENOUS | Status: DC | PRN
  Administered 2015-09-19: 2.5 mg via INTRAVENOUS
  Administered 2015-09-19 – 2015-09-25 (×8): 5 mg via INTRAVENOUS
  Filled 2015-09-19 (×8): qty 5

## 2015-09-19 MED ORDER — METOPROLOL TARTRATE 1 MG/ML IV SOLN
INTRAVENOUS | Status: AC
  Administered 2015-09-19: 2.5 mg via INTRAVENOUS
  Filled 2015-09-19: qty 5

## 2015-09-19 MED ORDER — FENTANYL CITRATE (PF) 100 MCG/2ML IJ SOLN
25.0000 ug | INTRAMUSCULAR | Status: DC | PRN
  Administered 2015-09-26: 25 ug via INTRAVENOUS
  Filled 2015-09-19: qty 2

## 2015-09-19 MED ORDER — VITAL HIGH PROTEIN PO LIQD
1000.0000 mL | ORAL | Status: DC
  Filled 2015-09-19: qty 1000

## 2015-09-19 MED ORDER — PRO-STAT SUGAR FREE PO LIQD
30.0000 mL | Freq: Three times a day (TID) | ORAL | Status: DC
  Administered 2015-09-19 – 2015-09-23 (×12): 30 mL
  Filled 2015-09-19 (×12): qty 30

## 2015-09-19 MED ORDER — IOHEXOL 300 MG/ML  SOLN
25.0000 mL | INTRAMUSCULAR | Status: AC
Start: 2015-09-19 — End: 2015-09-19
  Administered 2015-09-19 (×2): 25 mL via ORAL

## 2015-09-19 MED ORDER — NEPRO/CARBSTEADY PO LIQD: 1000.0000 mL | ORAL | Status: DC

## 2015-09-19 MED ORDER — NEPRO/CARBSTEADY PO LIQD
1000.0000 mL | ORAL | Status: DC
  Administered 2015-09-19: 1000 mL
  Filled 2015-09-19: qty 1000

## 2015-09-19 MED ORDER — SODIUM CHLORIDE 0.9 % IV BOLUS (SEPSIS)
750.0000 mL | Freq: Once | INTRAVENOUS | Status: AC
  Administered 2015-09-19: 750 mL via INTRAVENOUS

## 2015-09-19 MED ORDER — ALTEPLASE 2 MG IJ SOLR
2.0000 mg | Freq: Once | INTRAMUSCULAR | Status: AC
  Administered 2015-09-19: 2 mg
  Filled 2015-09-19: qty 2

## 2015-09-19 MED ORDER — DEXTROSE 5 % IV SOLN
20.0000 mmol | Freq: Once | INTRAVENOUS | Status: AC
  Administered 2015-09-19: 20 mmol via INTRAVENOUS
  Filled 2015-09-19: qty 6.67

## 2015-09-19 NOTE — Progress Notes (Signed)
Pharmacy Consult Note - IV heparin  Labs: heparin level 0.69  A/P: heparin level therapeutic (goal 0.3-0.7) on current rate of 1850 units/hr. Per RN, no reported bleeding or problems with IV site. Continue current rate of 1850 units/hr and will confirm continued goal level at current rate in 6 hours  Adrian Saran, PharmD, BCPS Pager [phone] 09/19/2015 4:53 PM

## 2015-09-19 NOTE — Progress Notes (Signed)
PCCM PROGRESS NOTE  ADMISSION DATE: 08/27/2015 CONSULT DATE: 08/30/2015 REFERRING PROVIDER: Dr. Roel Cluck, [facility]  CC: Short of breath  SUBJECTIVE:  Weaning, tries to f/c. Profoundly weak   VITAL SIGNS: BP 117/70 mmHg  Pulse 121  Temp(Src) 98.5 F (36.9 C) (Axillary)  Resp 30  Ht 5' 5.5" (1.664 m)  Wt 169 lb 5 oz (76.8 kg)  BMI 27.74 kg/m2  SpO2 100%  LMP 09/16/2015  INTAKE/OUTPUT: I/O last 3 completed shifts: In: 2645.3 [I.V.:811.8; Other:71; NG/GT:812.5; IV Piggyback:950] Out: 2422 [Urine:32; Emesis/NG output:125; Other:1990; Stool:275]  General:now awake, follows commands. A little more withdrawn today  HEENT: ETT, No JVD, orally intubated  Cardiac: s1 s2 regular, No MRG Chest: coarse BS, rhonchi w/ diffuse wheeze Abd: soft exam. + BS, no r/g, abdo wall edema Ext: 3+ edema lowers, uppers 2 plus Neuro: Now awake and follows commands, stronger. But right weaker than left.  Skin: no rashes, anasarca improving   CBC Recent Labs     09/17/15  1410  09/18/15  0510  09/19/15  0450  WBC  3.6*  3.0*  1.3*  HGB  8.4*  8.9*  8.4*  HCT  26.2*  27.8*  26.4*  PLT  175  137*  102*    Coag's Recent Labs     09/17/15  0420  09/18/15  0510  09/19/15  0548  APTT  51*  71*  96*    BMET Recent Labs     09/18/15  0510  09/18/15  1727  09/19/15  0450  NA  137  141  140  K  4.8  5.3*  4.3  CL  99*  103  102  CO2  '26  28  27  '$ BUN  45*  40*  31*  CREATININE  0.81  0.73  0.71  GLUCOSE  143*  123*  110*    Electrolytes Recent Labs     09/17/15  0420   09/18/15  0510  09/18/15  1727  09/19/15  0450  CALCIUM  9.3   < >  9.6  9.9  9.2  MG  2.8*   --   2.7*   --   2.4  PHOS  3.4   < >  3.0  5.0*  2.8   < > = values in this interval not displayed.    Sepsis Markers No results for input(s): PROCALCITON, O2SATVEN in the last 72 hours.  Invalid input(s): LACTICACIDVEN  ABG Recent Labs     09/18/15  1737  09/18/15  1920  09/18/15  2250  PHART  7.084*  7.087*   7.378  PCO2ART  102*  99.6*  45.5*  PO2ART  120*  111*  136*    Liver Enzymes Recent Labs     09/18/15  0510  09/18/15  1727  09/19/15  0450  ALBUMIN  2.2*  2.4*  2.0*    Cardiac Enzymes No results for input(s): TROPONINI, PROBNP in the last 72 hours.  Glucose Recent Labs     09/18/15  0826  09/18/15  1242  09/18/15  1646  09/18/15  2100  09/18/15  2307  09/19/15  0502  GLUCAP  152*  109*  114*  119*  128*  102*    Imaging Dg Chest Port 1 View  09/18/2015  CLINICAL DATA:  Endotracheal tube and OG tube placement. EXAM: PORTABLE CHEST 1 VIEW COMPARISON:  September 18, 2015 FINDINGS: The heart size and mediastinal contours are stable. Endotracheal tube is identified distal tip  2.9 cm from carina. Retraction by 1 cm recommended. A nasogastric tube is identified with distal tip in the mid stomach. Left central venous line is unchanged. Patchy consolidation is identified throughout left lung and visualized right upper lobe unchanged. There is dense consolidation of the right upper/mid and lower lung worse compared to prior exam. The visualized skeletal structures are unremarkable. IMPRESSION: Endotracheal tube distal tip 2.9 cm from carina. Retraction by 1 cm is recommended. Nasogastric tube identified distal tip in the mid stomach. These results will be called to the ordering clinician or representative by the Radiologist Assistant, and communication documented in the PACS or zVision Dashboard. Electronically Signed   By: Abelardo Diesel M.D.   On: 09/18/2015 20:41   Dg Chest Port 1 View  09/18/2015  CLINICAL DATA:  Acute respiratory failure. On ventilator. Pneumonia. Metastatic carcinoma of unknown primary. EXAM: PORTABLE CHEST 1 VIEW COMPARISON:  09/15/2015 FINDINGS: Support lines and tubes in appropriate position. No pneumothorax visualized. Multiple pulmonary nodules are again seen, consistent with pulmonary metastases. Increased consolidation and associated pleural effusions seen in the  right lung base. Heart size remains stable. IMPRESSION: Increased consolidation and pleural effusion in right lung base. Multiple pulmonary metastases again noted. Electronically Signed   By: Earle Gell M.D.   On: 09/18/2015 09:24  worsening aeration   CULTURES: 1/13 Blood >> neg 1/14 Pneumococcal Ag >> negative 1/14 Legionella Ag >> negative 1/15 C diff PCR >> negative 1/22 BAL>>>yeast candida only 5 k BCX2 1/21>>>neg  ANTIBIOTICS: 1/13 Rocephin >> 1/13 1/13 Zithromax >> 1/13 1/14 Vancomycin >> 1/19, restart 1/21>>>off 1/14 Zosyn >> 1/20. 1/21 Ceftaz >>>off 2/3 unasyn >>>  LINES/TUBES: 1/15 Rt PICC >> 1/24 Left IJ CVL 1/24>>> HD cath 1/27>>>  STUDIES: 1/13 CT chest >> multiple b/l nodules, mass like consolidation Rt mid lung, Rt hilar and subcarinal mass, Lt hilar LAN, 2.7 cm Rt paratracheal LAN 1/14 CT abd/pelvis >> 10 cm mass superior to uterus 1/15 Tumor markers >> CA 19-9 453, CEA 3.9, CA 125 780.8 1/16 Bronchoscopy >> crush artifact ?small cell lung cancer (non diagnostic)  EVENTS: 1/13 Admit 1/15 Gyn oncology consulted 1/16 Bronchoscopy in ICU; Placed on BiPAP for wheezing, dyspnea 1/19 IR biopsy>>>Poorly differentiated high Grade Carcinoma.  >stains favor Neuroendocrine. TTF stain was NEGATIVE 1/22 bronch cytology: neg  1/28 EEG>>> neg 1/31 weaning  2/1: gave fluid challenge and changed to even volume status.  2/2 passed SBT. Extubated. Re-intubated 2/3 again approached family re: goals of care. They remain unable to address this   ASSESSMENT/PLAN:  PULMONARY A: Acute hypoxic respiratory failure 2nd to post-obstructive pneumonia, metastatic cancer, and wheeze from extrinsic airway compression. Probably element of PE improved Re-intubated after failed extubation attempt. Doubt she will come off vent successfully.  P: - hep drip - keep even vol status - pulm hygiene  - discuss w/ patient re: re-intubation vs DNI again    CARDIAC A: Persistent  sinus  tachycardia-->improved; now worse again. Suspect that this reflects her volume status as it is better now   P:  - keep even  - cont tele    RENAL A: AKI w/ worsening Uremia Volume OverLoad  Both improved w/ CRRT.  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 DVT: left peroneal vein and Right UE extending into the Subclavian -->picc removed.  Anemia -->getting PRBCs 1/30 Pancytopenia   P: Heparin gtt for PE/DVT No chemo x 11 days-->do not think she is a candidate for this anymore.  Cbc follow up  INFECTION A: Sepsis 2nd to post-obstructive pneumonia. Fevers resolved and elevated LA are likely secondary to tumor Possible aspiration 2/3 P: Trend fever and WBC curve  See above   ENDOCRINE A: No acute issues. P: - monitor blood glucose on BMET  NEUROLOGY A: Cancer pain. Acute Encephalopathy. Suspect that this is r/t her AKI and uremia  R/o subclinical seizures, mets-->doubt, would favor uremia as etiology of Encephalopathy  P: - PRN Morphine (per family request) - minimize sedation   Extubated and re-intubated 2/2. Now back where we were.  Little else to offer here. Do not feel that Trach, more XRT OR chemo will make any difference. Will cont current rx.   Erick Colace ACNP-BC [place] Pager # [phone] OR # [phone] if no answer

## 2015-09-19 NOTE — Progress Notes (Signed)
Nutrition Follow-up  DOCUMENTATION CODES:   Not applicable  INTERVENTION:  Nepro begin a 59m/hr increase by 10 every 6 hours to goal rate of 46mhr 30 ml Prostat TID TF regimen provides 2028 kcal (106% of needs), 122g protein and 698 ml H20.  NUTRITION DIAGNOSIS:   Inadequate oral intake related to inability to eat as evidenced by NPO status.  ongoing  GOAL:   Patient will meet greater than or equal to 90% of their needs  Not meeting  MONITOR:   TF tolerance, Skin, Vent status, Labs, I & O's  REASON FOR ASSESSMENT:   Consult Enteral/tube feeding initiation and management  ASSESSMENT:   Presented with about the month worsens worsening shortness of breath and wheezing. She has been seen for this by her primary care provider diagnosed with pneumonia based on chest x-ray and completed antibiotics is not improvement repeat chest x-ray showed persistent pneumonia she had a total three courses of antibiotics Including z-pack, levaquin and clarithromycin. Suspect that she has a malignancy of sort, however, it is unclear what the primary is;  Multi-compartmental masses (chest, adenopathy, ovarian).   Patient is currently intubated on ventilator support MV: 13 L/min Temp (24hrs), Avg:96.6 F (35.9 C), Min:94.2 F (34.6 C), Max:98.5 F (36.9 C)  Propofol: none  Pt continues on CRRT, was extubated after passing an SBT, yesterday but failed weaning and was shortly re-intubated when she became hypercapnic. Pt's wt is now down to 169#/76.8kg. Per NP note, has attempted to address goals of care with family, but they are unable to address. Pt has poor prognosis at this point. NP is not considering trach at this time with stage of metastatic cancer.  Labs: BUN 31, LFT elevated. Medications: Fentanyl, Apresoline, Lopressor, Morphine  Diet Order:  Diet NPO time specified  Skin:  Reviewed, no issues  Last BM:  2/1  Height:   Ht Readings from Last 1 Encounters:  09/13/15 5'  5.5" (1.664 m)    Weight:   Wt Readings from Last 1 Encounters:  09/18/15 169 lb 5 oz (76.8 kg)    Ideal Body Weight:  57.95 kg (kg)  BMI:  Body mass index is 27.74 kg/(m^2).  Estimated Nutritional Needs:   Kcal:  1920  Protein:  115-150 grams  Fluid:  >/= 2 L/day  EDUCATION NEEDS:   No education needs identified at this time  WiSatira AnisWard, MS, RD LDN After Hours/Weekend Pager [phone]

## 2015-09-19 NOTE — Progress Notes (Signed)
eLink Physician-Brief Progress Note Patient Name: LUGENE HITT DOB: 1975/08/20 MRN: [id number]   Date of Service  09/19/2015  HPI/Events of Note  CT report with concern for pancreatitis.  eICU Interventions  NPO now. Lipase ordered.     Intervention Category Major Interventions: Other:  YACOUB,WESAM 09/19/2015, 6:26 PM

## 2015-09-19 NOTE — Progress Notes (Signed)
   09/19/15 1400  Clinical Encounter Type  Visited With Family  Visit Type Follow-up;Psychological support;Spiritual support;Critical Care  Spiritual Encounters  Spiritual Needs Emotional;Other (Comment) Academic librarian)  Stress Factors  Patient Stress Factors Not reviewed  Family Stress Factors Health changes   I followed up with the patient's family members who were at the bedside. They had just spoken with the oncologist about plans for patient care. At this time, the patient's husband continues to be optimistic about his wife's condition and wants to push forward with whatever medical procedures could possibly "save her."  The family stated that yesterday they were extremely optimistic when the patient was extubated, but were very disappointed when the patient had to be intubated again.  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  As noted previously, the brother and father understand the severity of the patient's condition and want to focus on reality; but they are worried about the patient's husband and how he is coping with the current situation.  Spiritual Care and our counseling intern have been giving the family support and will continue to try and help the patient's husband through issues of grief.  I was asked to help the family discuss goals of care with the patient when she was awake, but the patient's husband didn't want to bother the patient with that at the time. They were not able to have that conversation with the patient.  I will continue to follow up with the family. Please let us know how we can be of any other help.    [place] M.Div.

## 2015-09-19 NOTE — Progress Notes (Signed)
IP PROGRESS NOTE  Subjective:   She was extubated yesterday, but developed CO2 retention yesterday afternoon and required reintubation. Her husband and other family members are at the bedside.  Objective: Vital signs in last 24 hours: Blood pressure 105/72, pulse 126, temperature 98.5 F (36.9 C), temperature source Axillary, resp. rate 30, height 5' 5.5" (1.664 m), weight 169 lb 5 oz (76.8 kg), last menstrual period 09/13/2015, SpO2 100 %.  Intake/Output from previous day: 02/02 0701 - 02/03 0700 In: 1680.3 [I.V.:547.8; NG/GT:182.5; IV Piggyback:950] Out: 7824 [Urine:12; Emesis/NG output:125; Stool:150]  Physical Exam:  HEENT: ET tube in place Lungs: Bilateral rhonchi Abdomen: Soft, tender in the low abdomen Extremities: No edema Neurologic: Opens eyes, follows some commands  Left IJ catheter site without erythema  Lab Results:  Recent Labs  09/18/15 0510 09/19/15 0450  WBC 3.0* 1.3*  HGB 8.9* 8.4*  HCT 27.8* 26.4*  PLT 137* 102*     BMET  Recent Labs  09/18/15 1727 09/19/15 0450  NA 141 140  K 5.3* 4.3  CL 103 102  CO2 28 27  GLUCOSE 123* 110*  BUN 40* 31*  CREATININE 0.73 0.71  CALCIUM 9.9 9.2    Studies/Results: Dg Chest Port 1 View  09/18/2015  CLINICAL DATA:  Endotracheal tube and OG tube placement. EXAM: PORTABLE CHEST 1 VIEW COMPARISON:  September 18, 2015 FINDINGS: The heart size and mediastinal contours are stable. Endotracheal tube is identified distal tip 2.9 cm from carina. Retraction by 1 cm recommended. A nasogastric tube is identified with distal tip in the mid stomach. Left central venous line is unchanged. Patchy consolidation is identified throughout left lung and visualized right upper lobe unchanged. There is dense consolidation of the right upper/mid and lower lung worse compared to prior exam. The visualized skeletal structures are unremarkable. IMPRESSION: Endotracheal tube distal tip 2.9 cm from carina. Retraction by 1 cm is recommended.  Nasogastric tube identified distal tip in the mid stomach. These results will be called to the ordering clinician or representative by the Radiologist Assistant, and communication documented in the PACS or zVision Dashboard. Electronically Signed   By: Abelardo Diesel M.D.   On: 09/18/2015 20:41   Dg Chest Port 1 View  09/18/2015  CLINICAL DATA:  Acute respiratory failure. On ventilator. Pneumonia. Metastatic carcinoma of unknown primary. EXAM: PORTABLE CHEST 1 VIEW COMPARISON:  09/15/2015 FINDINGS: Support lines and tubes in appropriate position. No pneumothorax visualized. Multiple pulmonary nodules are again seen, consistent with pulmonary metastases. Increased consolidation and associated pleural effusions seen in the right lung base. Heart size remains stable. IMPRESSION: Increased consolidation and pleural effusion in right lung base. Multiple pulmonary metastases again noted. Electronically Signed   By: Earle Gell M.D.   On: 09/18/2015 09:24    Medications: I have reviewed the patient's current medications.  Assessment/Plan:  1. Metastatic carcinoma with a dominant right lung mass, bilateral lung masses, chest/abdominal lymphadenopathy, and a pelvic mass  Bronchoscopy 09/01/2015 revealed endobronchial lesions at the right upper lobe and right middle lobe, biopsies revealed malignant cells without a definitive diagnosis  CT-guided biopsy of a respiratory lymph node 09/04/2015 confirmed poorly differential carcinoma, with some features of a neuroendocrine carcinoma.  Cycle 1 etoposide/carboplatin 09/10/2015  2. Respiratory failure secondary to #1 and pneumonia  3. Renal failure-maintained on CRRT day #8  4. Left leg and right upper extremity DVTs 09/09/2015  5.  Altered mental status secondary to uremia and respiratory failure-improved  6.  Anemia secondary to phlebotomy, metastatic carcinoma, and  renal failure, status post a red cell transfusion 09/15/2015  7.   Leukopenia/thrombocytopenia secondary to chemotherapy  Ms. Bastin is now at day 10 following a first cycle of etoposide/carboplatin. She has progressive leukopenia/thrombocytopenia secondary to chemotherapy.  She failed a trial of extubation yesterday.  I reviewed her chest x-rays in radiology this morning. It appears that some of the lung masses are smaller, but the dominant area of consolidation in the right lung has progressed. She appears to have a right pleural effusion.  She remains critically ill with multiorgan failure. Her prognosis is poor.  We could consider repeat CTs to get a better idea of whether there has been any improvement with the etoposide/carboplatin.  Recommendations: 1. Continue management of respiratory failure per critical care medicine 2. CRRT per nephrology 3. Monitor CBC, check white cell differential 4. Consider palliative radiation to the dominant right lung tumor and a right thoracentesis if pulmonary medicine feels this would improve the respiratory failure    LOS: 20 days   [place]  09/19/2015, 8:55 AM

## 2015-09-19 NOTE — Progress Notes (Signed)
[name] Progress Note   Subjective: no problems with CRRT.  Running even-  No UOP -  Unfortunately failed extubation and had to be reintubated- also WBC down to 1.3  Filed Vitals:   09/19/15 0900 09/19/15 1000 09/19/15 1100 09/19/15 1200  BP: 112/76 117/70 98/71 100/77  Pulse: 126 121 119 117  Temp:      TempSrc:      Resp: '21 30 30 30  '$ Height:      Weight:      SpO2: 100% 100% 100% 100%    Inpatient medications: . ampicillin-sulbactam (UNASYN) IV  3 g Intravenous Q8H  . antiseptic oral rinse  7 mL Mouth Rinse QID  . budesonide (PULMICORT) nebulizer solution  0.5 mg Nebulization BID  . chlorhexidine  15 mL Mouth Rinse BID  . feeding supplement (PRO-STAT SUGAR FREE 64)  30 mL Per Tube TID  . levalbuterol  0.63 mg Nebulization Q6H  . pantoprazole sodium  40 mg Per Tube Daily  . sodium chloride  10-40 mL Intracatheter Q12H  . sodium chloride  3 mL Intravenous Q12H   . feeding supplement (NEPRO CARB STEADY)    . heparin 1,850 Units/hr (09/19/15 1200)  . dialysis replacement fluid (prismasate) 400 mL/hr at 09/19/15 0728  . dialysis replacement fluid (prismasate) 200 mL/hr at 09/19/15 0729  . dialysate (PRISMASATE) 2,000 mL/hr at 09/19/15 1155   alteplase, alteplase, artificial tears, fentaNYL (SUBLIMAZE) injection, fentaNYL (SUBLIMAZE) injection, heparin, heparin lock flush, heparin lock flush, hydrALAZINE, levalbuterol, loperamide, metoprolol, morphine injection, [DISCONTINUED] ondansetron **OR** ondansetron (ZOFRAN) IV, polyvinyl alcohol, simethicone, sodium chloride, sodium chloride, sodium chloride flush, sodium chloride flush  Exam: Family conference taking place with CCM  No jvd Chest coarse BS bilat RRR no mrg Abd soft mod distended no mass GU foley in place Ext diffuse 2-3+ ext edema x 4- left femoral cath placed 1/27 Neuro not responding   CXR RML consolidation, scattered nodular changes UA 1/21 > protein neg, 0-5 rbc/wbc's   Assessment: 1  Acute renal failure - suspect ATN, oliguric/anuric. CRRT day #8. Uremia better along with volume- to continue CRRT for now- no heparin in machine but on heparin drip for DVTs.  Timing of trying to stop dialysis support is very tricky- failed extubation- I am not sure once we stop that she will pick up UOP right away so hesitant to stop it because we do not have the luxury of allowing her volume or metabolic status to get worse even temporarily- but I do know it will need to be stopped eventually- maybe not until they decide on less care?   did get carboplatin so possibly nephrotox from that.   2 Vol excess  - taking a break from UF right now given tachy - run even -  no current weights- still with peripheral edema but seems OK 3 Anemia Hb 8.4  Transfusion in past- transfuse as needed 4 Metastatic cancer, neuroendocrine type-  SP chemo x 1 on 1/25- carboplatin- obviously a complicating factor  5 Resp failure - PNA/ nodular mets, + vol excess. FiO2 down 0.40,. CXR better w vol removal 6. Elytes- OK - all 4 K dialysate - phos down some- give  repletion again today    Monique [name] A   09/19/2015, 12:29 PM    Recent Labs Lab 09/18/15 0510 09/18/15 1727 09/19/15 0450  NA 137 141 140  K 4.8 5.3* 4.3  CL 99* 103 102  CO2 '26 28 27  '$ GLUCOSE 143* 123* 110*  BUN 45*  40* 31*  CREATININE 0.81 0.73 0.71  CALCIUM 9.6 9.9 9.2  PHOS 3.0 5.0* 2.8    Recent Labs Lab 09/14/15 0627  09/15/15 0510  09/16/15 0421  09/18/15 0510 09/18/15 1727 09/19/15 0450  AST 248*  --  214*  --  188*  --   --   --   --   ALT 73*  --  84*  --  102*  --   --   --   --   ALKPHOS 467*  --  460*  --  561*  --   --   --   --   BILITOT 2.1*  --  1.8*  --  1.1  --   --   --   --   PROT 6.8  --  6.1*  --  7.0  --   --   --   --   ALBUMIN 2.0*  < > 1.8*  1.9*  < > 2.0*  2.0*  < > 2.2* 2.4* 2.0*  < > = values in this interval not displayed.  Recent Labs Lab 09/15/15 0510  09/17/15 1410 09/18/15 0510  09/19/15 0450  WBC 8.5  < > 3.6* 3.0* 1.3*  NEUTROABS 8.2*  --   --  2.5 0.8*  HGB 6.7*  < > 8.4* 8.9* 8.4*  HCT 21.2*  < > 26.2* 27.8* 26.4*  MCV 85.5  < > 85.6 84.5 86.6  PLT 224  < > 175 137* 102*  < > = values in this interval not displayed.

## 2015-09-19 NOTE — Progress Notes (Signed)
eLink Physician-Brief Progress Note Patient Name: Monique [name] DOB: February 24, 1976 MRN: [id number]   Date of Service  09/19/2015  HPI/Events of Note  HR in the 130s persistent.  HD stable on no pressors.  eICU Interventions  PRN lopressor 2.5 to 5 mg IV q3 hours prn     Intervention Category Intermediate Interventions: Arrhythmia - evaluation and management  Jadin Creque,Arneshia 09/19/2015, 1:59 AM

## 2015-09-19 NOTE — Progress Notes (Signed)
ANTICOAGULATION CONSULT NOTE - Follow Up Consult  Pharmacy Consult for Heparin Indication: Subclavian vein and axillary vein DVT  No Known Allergies  Patient Measurements: Height: 5' 5.5" (166.4 cm) Weight: 169 lb 5 oz (76.8 kg) IBW/kg (Calculated) : 58.15 Heparin Dosing Weight:   Vital Signs: Temp: 98.5 F (36.9 C) (02/03 0400) Temp Source: Axillary (02/03 0400) BP: 116/70 mmHg (02/03 0600) Pulse Rate: 120 (02/03 0600)  Labs:  Recent Labs  09/17/15 0420  09/17/15 1410  09/18/15 0510 09/18/15 1727 09/19/15 0450 09/19/15 0548  HGB  --   < > 8.4*  --  8.9*  --  8.4*  --   HCT  --   --  26.2*  --  27.8*  --  26.4*  --   PLT  --   --  175  --  137*  --  102*  --   APTT 51*  --   --   --  71*  --   --  96*  HEPARINUNFRC 0.35  --  0.30  --  0.57  --   --  0.79*  CREATININE 0.91  --   --   < > 0.81 0.73 0.71  --   < > = values in this interval not displayed.  Estimated Creatinine Clearance: 96.8 mL/min (by C-G formula based on Cr of 0.71).   Medications:  Infusions:  . heparin 1,950 Units/hr (09/19/15 0600)  . dialysis replacement fluid (prismasate) 400 mL/hr at 09/18/15 1818  . dialysis replacement fluid (prismasate) 200 mL/hr at 09/18/15 0450  . dialysate (PRISMASATE) 2,000 mL/hr at 09/19/15 0206    Assessment: Patient with heparin level above goal.  No heparin issues per RN.  Goal of Therapy:  Heparin level 0.3-0.7 units/ml Monitor platelets by anticoagulation protocol: Yes   Plan:  Decrease heparin to 1850 units/hr Recheck heparin level at 1500  [address] [place] 09/19/2015,6:32 AM

## 2015-09-20 DIAGNOSIS — D696 Thrombocytopenia, unspecified: Secondary | ICD-10-CM

## 2015-09-20 DIAGNOSIS — I82403 Acute embolism and thrombosis of unspecified deep veins of lower extremity, bilateral: Secondary | ICD-10-CM

## 2015-09-20 DIAGNOSIS — I82409 Acute embolism and thrombosis of unspecified deep veins of unspecified lower extremity: Secondary | ICD-10-CM

## 2015-09-20 DIAGNOSIS — R29898 Other symptoms and signs involving the musculoskeletal system: Secondary | ICD-10-CM

## 2015-09-20 LAB — RENAL FUNCTION PANEL
ALBUMIN: 1.9 g/dL — AB (ref 3.5–5.0)
ALBUMIN: 1.9 g/dL — AB (ref 3.5–5.0)
Anion gap: 13 (ref 5–15)
Anion gap: 10 (ref 5–15)
BUN: 27 mg/dL — AB (ref 6–20)
BUN: 28 mg/dL — AB (ref 6–20)
CHLORIDE: 102 mmol/L (ref 101–111)
CO2: 25 mmol/L (ref 22–32)
CO2: 27 mmol/L (ref 22–32)
CREATININE: 0.66 mg/dL (ref 0.44–1.00)
Calcium: 9.4 mg/dL (ref 8.9–10.3)
Calcium: 9 mg/dL (ref 8.9–10.3)
Chloride: 101 mmol/L (ref 101–111)
Creatinine, Ser: 0.76 mg/dL (ref 0.44–1.00)
GFR calc Af Amer: >60 mL/min (ref >60)
GFR calc non Af Amer: >60 mL/min (ref >60)
Glucose, Bld: 132 mg/dL — ABNORMAL HIGH (ref 65–99)
Glucose, Bld: 161 mg/dL — ABNORMAL HIGH (ref 65–99)
PHOSPHORUS: 3.2 mg/dL (ref 2.5–4.6)
POTASSIUM: 4.3 mmol/L (ref 3.5–5.1)
Phosphorus: 3.7 mg/dL (ref 2.5–4.6)
Potassium: 4.6 mmol/L (ref 3.5–5.1)
Sodium: 139 mmol/L (ref 135–145)
Sodium: 139 mmol/L (ref 135–145)

## 2015-09-20 LAB — CBC WITH DIFFERENTIAL/PLATELET
BASOS PCT: 1 %
Basophils Absolute: 0 10*3/uL (ref 0.0–0.1)
EOS PCT: 0 %
Eosinophils Absolute: 0 10*3/uL (ref 0.0–0.7)
HEMATOCRIT: 22.9 % — AB (ref 36.0–46.0)
Hemoglobin: 7.4 g/dL — ABNORMAL LOW (ref 12.0–15.0)
LYMPHS PCT: 40 %
Lymphs Abs: 0.4 10*3/uL — ABNORMAL LOW (ref 0.7–4.0)
MCH: 27.5 pg (ref 26.0–34.0)
MCHC: 32.3 g/dL (ref 30.0–36.0)
MCV: 85.1 fL (ref 78.0–100.0)
Monocytes Absolute: 0.1 10*3/uL (ref 0.1–1.0)
Monocytes Relative: 8 %
NEUTROS PCT: 51 %
Neutro Abs: 0.4 10*3/uL — ABNORMAL LOW (ref 1.7–7.7)
PLATELETS: 60 10*3/uL — AB (ref 150–400)
RBC: 2.69 MIL/uL — AB (ref 3.87–5.11)
RDW: 15.2 % (ref 11.5–15.5)
WBC: 0.9 10*3/uL — AB (ref 4.0–10.5)

## 2015-09-20 LAB — GLUCOSE, CAPILLARY
GLUCOSE-CAPILLARY: 121 mg/dL — AB (ref 65–99)
GLUCOSE-CAPILLARY: 124 mg/dL — AB (ref 65–99)
GLUCOSE-CAPILLARY: 129 mg/dL — AB (ref 65–99)
Glucose-Capillary: 128 mg/dL — ABNORMAL HIGH (ref 65–99)
Glucose-Capillary: 138 mg/dL — ABNORMAL HIGH (ref 65–99)
Glucose-Capillary: 118 mg/dL — ABNORMAL HIGH (ref 65–99)

## 2015-09-20 LAB — CK: CK TOTAL: 611 U/L — AB (ref 38–234)

## 2015-09-20 LAB — APTT: aPTT: 120 seconds — ABNORMAL HIGH (ref 24–37)

## 2015-09-20 LAB — TRIGLYCERIDES: Triglycerides: 240 mg/dL — ABNORMAL HIGH (ref <150)

## 2015-09-20 LAB — MAGNESIUM: Magnesium: 2.5 mg/dL — ABNORMAL HIGH (ref 1.7–2.4)

## 2015-09-20 LAB — HEPARIN LEVEL (UNFRACTIONATED): HEPARIN UNFRACTIONATED: 0.63 [IU]/mL (ref 0.30–0.70)

## 2015-09-20 MED ORDER — DEXTROSE 5 % IV SOLN
1.0000 g | Freq: Three times a day (TID) | INTRAVENOUS | Status: DC
  Administered 2015-09-20 – 2015-09-24 (×11): 1 g via INTRAVENOUS
  Filled 2015-09-20 (×12): qty 1

## 2015-09-20 MED ORDER — VANCOMYCIN HCL 10 G IV SOLR
1500.0000 mg | Freq: Once | INTRAVENOUS | Status: AC
  Administered 2015-09-20: 1500 mg via INTRAVENOUS
  Filled 2015-09-20: qty 1500

## 2015-09-20 MED ORDER — DEXTROSE 5 % IV SOLN
2.0000 g | Freq: Once | INTRAVENOUS | Status: AC
  Administered 2015-09-20: 2 g via INTRAVENOUS
  Filled 2015-09-20: qty 2

## 2015-09-20 MED ORDER — VANCOMYCIN HCL IN DEXTROSE 750-5 MG/150ML-% IV SOLN
750.0000 mg | INTRAVENOUS | Status: DC
  Administered 2015-09-21: 750 mg via INTRAVENOUS
  Filled 2015-09-20 (×2): qty 150

## 2015-09-20 MED ORDER — NEPRO/CARBSTEADY PO LIQD
1000.0000 mL | ORAL | Status: DC
  Administered 2015-09-20: 1000 mL via ORAL
  Filled 2015-09-20 (×2): qty 1000

## 2015-09-20 MED ORDER — FILGRASTIM 480 MCG/1.6ML IJ SOLN
480.0000 ug | Freq: Every day | INTRAMUSCULAR | Status: DC
  Administered 2015-09-20 – 2015-09-24 (×5): 480 ug via SUBCUTANEOUS
  Filled 2015-09-20 (×8): qty 1.6

## 2015-09-20 NOTE — Progress Notes (Signed)
Pharmacy Antibiotic Note  MAELIE CHRISWELL is a 40 y.o. female admitted on 08/31/2015. Pharmacy has been consulted for vancomycin and ceftazidime dosing for HCAP.  Plan:  Vancomycin '1500mg'$  (20 mg/kg) IV x 1, then '750mg'$  (10 mg/kg) IV q24h  Check trough at steady state, goal 15-20 mcg/ml  Ceftazidime 2g IV x 1, then 1g IV q8h  Monitor CVVH tolerance Follow up cultures and sensitivities, de-escalate as approriate  Height: 5' 5.5" (166.4 cm) Weight: 167 lb 8.8 oz (76 kg) IBW/kg (Calculated) : 58.15  Temp (24hrs), Avg:97.3 F (36.3 C), Min:96 F (35.6 C), Max:98.7 F (37.1 C)   Recent Labs Lab 09/16/15 0421  09/17/15 1410  09/18/15 0510 09/18/15 1727 09/19/15 0450 09/19/15 1615 09/20/15 0400  WBC 6.8  --  3.6*  --  3.0*  --  1.3*  --  0.9*  CREATININE 0.95  < >  --   < > 0.81 0.73 0.71 0.71 0.76  < > = values in this interval not displayed.  Estimated Creatinine Clearance: 96.4 mL/min (by C-G formula based on Cr of 0.76).    No Known Allergies  Antimicrobials this admission: 1/13 >> Rocephin >> 1/13 1/13 >> Zithromax >> 1/13 1/14 >> Vanc >> 1/19, 1/21 >> Vanc >> 1/25 1/14 >> Zosyn >> 1/20 1/21 >> Fortaz >> 1/31 1/28 >> Diflucan >> 1/31 2/2 >>Unasyn >> 2/4 2/4 >> Vanc >> 2/4 >> Ceftaz >>  Dose adjustments this admission: None while on CVVH  Microbiology results: 1/14 HIV screen: neg 1/14 S. pneumo UAg: neg 1/14 Legionella UAg: neg 1/14 MRSA PCR: neg 1/15 Cdiff PCR: neg 1/13 blood x2: NGF 1/21 Trach asp: moderate yeast c/w candida, final(insignificant per CCM) 1/21 blood x2: NGF 1/22 bronch washings: 5K colonies/ml, Candida, final(insignificant per CCM) 2/4 BCx: sent 2/4 UCx: ordered 2/4 Trach asp:  Thank you for allowing pharmacy to be a part of this patient's care.  Peggyann Juba, PharmD, BCPS Pager: [phone] 09/20/2015 9:49 AM

## 2015-09-20 NOTE — Progress Notes (Signed)
[facility] Progress Note   Subjective: no problems with CRRT.  Running even-  No UOP -  WBC cont to drop from chemo  Filed Vitals:   09/20/15 0832 09/20/15 0900 09/20/15 1000 09/20/15 1100  BP:  104/58 122/81 126/85  Pulse:  117 123 118  Temp:      TempSrc:      Resp:  '14 21 17  '$ Height:      Weight:      SpO2: 97% 96% 97% 97%    Inpatient medications: . antiseptic oral rinse  7 mL Mouth Rinse QID  . budesonide (PULMICORT) nebulizer solution  0.5 mg Nebulization BID  . cefTAZidime (FORTAZ)  IV  1 g Intravenous Q8H  . cefTAZidime (FORTAZ)  IV  2 g Intravenous Once  . chlorhexidine  15 mL Mouth Rinse BID  . feeding supplement (PRO-STAT SUGAR FREE 64)  30 mL Per Tube TID  . filgrastim  480 mcg Subcutaneous q1800  . levalbuterol  0.63 mg Nebulization Q6H  . pantoprazole sodium  40 mg Per Tube Daily  . sodium chloride  10-40 mL Intracatheter Q12H  . sodium chloride  3 mL Intravenous Q12H  . vancomycin  1,500 mg Intravenous Once  . [START ON 09/21/2015] vancomycin  750 mg Intravenous Q24H   . feeding supplement (NEPRO CARB STEADY) 1,000 mL (09/20/15 0933)  . heparin 1,850 Units/hr (09/19/15 2309)  . dialysis replacement fluid (prismasate) 400 mL/hr at 09/19/15 2117  . dialysis replacement fluid (prismasate) 200 mL/hr at 09/19/15 2036  . dialysate (PRISMASATE) 2,000 mL/hr at 09/20/15 0937   alteplase, alteplase, artificial tears, fentaNYL (SUBLIMAZE) injection, fentaNYL (SUBLIMAZE) injection, heparin, heparin lock flush, heparin lock flush, hydrALAZINE, levalbuterol, loperamide, metoprolol, morphine injection, [DISCONTINUED] ondansetron **OR** ondansetron (ZOFRAN) IV, polyvinyl alcohol, simethicone, sodium chloride, sodium chloride, sodium chloride flush, sodium chloride flush  Exam: More alert  No jvd Chest coarse BS bilat RRR no mrg Abd soft mod distended no mass GU foley in place Ext diffuse edema but better  x 4- left femoral cath placed 1/27 Neuro not  responding   CXR RML consolidation, scattered nodular changes UA 1/21 > protein neg, 0-5 rbc/wbc's   Assessment: 1 Acute renal failure - suspect ATN, oliguric/anuric. CRRT day #9. Uremia better along with volume- to continue CRRT for now- no heparin in machine but on heparin drip for DVTs.  Timing of trying to stop dialysis support is very tricky- failed extubation- I am not sure once we stop that she will pick up UOP right away so hesitant to stop it because we do not have the luxury of allowing her volume or metabolic status to get worse even temporarily- but I do know it will need to be stopped eventually- maybe when we need to change the line or if UOP does start to come.    did get carboplatin so possibly nephrotox from that.   2 Vol excess  - taking a break from UF right now given tachy - run even -  no current weights- still with peripheral edema but seems OK 3 Anemia Hb 7.4- from chemo  Transfusion in past- transfuse as needed 4 Metastatic cancer, neuroendocrine type-  SP chemo x 1 on 1/25- carboplatin- obviously a complicating factor  5 Resp failure - PNA/ nodular mets, + vol excess. FiO2 down 0.40,. CXR better w vol removal 6. Elytes- OK - all 4 K dialysate - phos down some- repleted 2/3   Ebon Ketchum A   09/20/2015, 11:17 AM  Recent Labs Lab 09/19/15 0450 09/19/15 1615 09/20/15 0400  NA 140 137 139  K 4.3 4.1 4.6  CL 102 99* 101  CO2 '27 26 25  '$ GLUCOSE 110* 143* 132*  BUN 31* 30* 27*  CREATININE 0.71 0.71 0.76  CALCIUM 9.2 8.8* 9.4  PHOS 2.8 3.0 3.7    Recent Labs Lab 09/14/15 0627  09/15/15 0510  09/16/15 0421  09/19/15 0450 09/19/15 1615 09/20/15 0400  AST 248*  --  214*  --  188*  --   --   --   --   ALT 73*  --  84*  --  102*  --   --   --   --   ALKPHOS 467*  --  460*  --  561*  --   --   --   --   BILITOT 2.1*  --  1.8*  --  1.1  --   --   --   --   PROT 6.8  --  6.1*  --  7.0  --   --   --   --   ALBUMIN 2.0*  < > 1.8*  1.9*  < > 2.0*  2.0*   < > 2.0* 2.1* 1.9*  < > = values in this interval not displayed.  Recent Labs Lab 09/18/15 0510 09/19/15 0450 09/20/15 0400  WBC 3.0* 1.3* 0.9*  NEUTROABS 2.5 0.8* 0.4*  HGB 8.9* 8.4* 7.4*  HCT 27.8* 26.4* 22.9*  MCV 84.5 86.6 85.1  PLT 137* 102* 60*

## 2015-09-20 NOTE — Progress Notes (Signed)
This is a verycomplicated case. Monique [name], thankfully is quite young.  She is on dialysis. Renal function looks pretty good right now.  She had a CT scan done yesterday. It looks like her malignancy is a little bit better. This should be viewed as positive.I would have to think thatthis tumor is some type of neuroendocrine cancer. The fact that she had treatment 10 days ago and is already a little bit of a response is encouraging. She is still intubated. She is alert.  She is on heparin. She has DVTs.  Her platelet count is dropping. I think this is from the chemotherapy. I think that Neupogen will help with her immune system. I will start that.  She is being followed by multiple specialists.  I spoke to she and her husband this morning.  Her hemoglobin is dropped. It is 7.4. Her blood pressure is okay. As such, I try would hold off on transfusing her for right now.  With her thrombocytopenia, the heparin will have to be monitored closely. Again, I don't think this is heparin-induced thrombocytopenia. She is at the point where her blood counts will nadir from chemotherapy.  Her BUN and creatinine are 27 and 0.76 This seems to be very good. Her albumin is only 1.9. I don't know she's being fed via TNA.if not, it may not be a bad idea to do this.  So far, cultures have been negative.  On her physical exam, her vital signs show temperature 98.4. Pulse 117. Blood pressure 126/86. Her lungs show scattered crackles and rhonchi bilaterally. She has decent air movement. Cardiac exam tachycardic but regular. There are no murmurs. Abdomen soft. She has decreased bowel sounds. There is no obvious abdominal mass. There is no fluid wave. Extremities shows protective boots on her ankles  Bilaterally. Skin exam shows no ecchymoses.  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 Again, we'll have to think this is some type of neuroendocrine malignancy., I am not sure if the primary site we'll  be identified. She may need to have molecular array studies done at some point to see if a primary site can be identified.  This will be a long process I think. It is encouraging that the CT seemed to show that the cancer is slightly better. There appear to be some bleeding into this uterine mass.   We will continue to follow her along. She is at her nadir for her blood counts. Neupogen should help with her white cell count.  She is getting fantastic care by everybody down in the ICU.  Pete E. Romans 5:3-5

## 2015-09-20 NOTE — Consult Note (Signed)
Neurology Consultation Reason for Consult: Weakness Referring Physician: Lake Bells, D  CC: Weakness  History is obtained from: Husband  HPI: Monique [name] is a 40 y.o. female who was admitted on 1/13 with postobstructive pneumonia secondary to lung mass. She was intubated 1/19 due to shortness of breath. 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 She has had persistent weakness which is hard to judge timinig on because of her prolonged intubation.    ROS:  Unable to obtain due to intubation  History reviewed. No pertinent past medical history.   Family History  Problem Relation Age of Onset  . Lymphoma Mother   . CAD Mother      Social History:  reports that she has never smoked. She does not have any smokeless tobacco history on file. She reports that she drinks alcohol. She reports that she does not use illicit drugs.   Exam: Current vital signs: BP 122/76 mmHg  Pulse 123  Temp(Src) 97.3 F (36.3 C) (Oral)  Resp 17  Ht 5' 5.5" (1.664 m)  Wt 76 kg (167 lb 8.8 oz)  BMI 27.45 kg/m2  SpO2 98%  LMP 09/05/2015 Vital signs in last 24 hours: Temp:  [96 F (35.6 C)-98.7 F (37.1 C)] 97.3 F (36.3 C) (02/04 1154) Pulse Rate:  [95-131] 123 (02/04 1500) Resp:  [14-24] 17 (02/04 1500) BP: (94-137)/(58-86) 122/76 mmHg (02/04 1500) SpO2:  [95 %-100 %] 98 % (02/04 1500) FiO2 (%):  [45 %-50 %] 45 % (02/04 1500) Weight:  [76 kg (167 lb 8.8 oz)] 76 kg (167 lb 8.8 oz) (02/04 0500)   Physical Exam  Constitutional: Appears well-developed and well-nourished.  Psych: Affect appropriate to situation Eyes: No scleral injection HENT: ET tube in place.  Head: Normocephalic.  Cardiovascular: Normal rate and regular rhythm.  Respiratory: Effort normal and breath sounds normal to anterior ascultation GI: Soft.  No distension. There is no tenderness.  Skin: WDI  Neuro: Mental Status: Patient is awake, alert, unable to speak due to intubation  but answers with nods/shakes and follows commands.  Cranial Nerves: II: Endorses vision in both hemi-fields. Pupils are equal, round, and reactive to light.   III,IV, VI: She has slightly dysconjugate gaze with right eye externally deviated compared to left,  V: Facial sensation is symmetric to temperature VII: Facial movement is symmetric.  VIII: hearing is intact to voice X: Uvula elevates symmetrically XI: Shoulder shrug is symmetric. XII: tongue is midline without atrophy or fasciculations.  Motor: Tone is normal. Bulk is normal. She is unable to lift arms or legs from bed.  Sensory: Sensation is diminished to light touch to the shoulder, symmetrically.  Deep Tendon Reflexes: absent throughout.  Plantars: Toes are downgoing bilaterally.  Cerebellar: Unable to perform due to weakness    I have reviewed labs in epic and the results pertinent to this consultation are: cmp - unremarkable.    Impression: 40 yo F with singificant muscle weakness/sensory loss most consistent with neuropathy, liekly critical illness neuropathy. I will recommend an MRI C-spine to rule out myelopathic process, but I think this is less likely. Also possible would be a paraneoplastic process and therefore will send paraneoplastic panel.   Recommendations: 1) MRI C-spine 2) paraneoplastic auto-antibody panel(sent to [facility], test code PAVAL) 3) B12    Roland Rack, MD [facility] [phone]  If 7pm- 7am, please page neurology on call as listed in [place].

## 2015-09-20 NOTE — Progress Notes (Signed)
PCCM PROGRESS NOTE  ADMISSION DATE: 09/11/2015 CONSULT DATE: 08/30/2015 REFERRING PROVIDER: Dr. Roel Cluck, [facility]  CC: Short of breath  SUBJECTIVE:  PSV 8/5, more awake  VITAL SIGNS: BP 104/58 mmHg  Pulse 117  Temp(Src) 98.4 F (36.9 C) (Oral)  Resp 14  Ht 5' 5.5" (1.664 m)  Wt 167 lb 8.8 oz (76 kg)  BMI 27.45 kg/m2  SpO2 96%  LMP 09/10/2015  INTAKE/OUTPUT: I/O last 3 completed shifts: In: 3130.8 [I.V.:777.8; Other:676; NG/GT:189; IV Piggyback:1488] Out: 2262 [Urine:12; Emesis/NG output:100; Other:2000; Stool:150]  General: awake, weak grip strength HENT: NCAT ETT in place PULM: Crackles bilaterally, no wheezing CV: Tachy, regular GI: BS+, soft, nontender Derm: stage 2 with mild surrounding brown changes sacral area about 4-5 cm total Neuro: Awake on vent, nods head, follows commands  CBC Recent Labs     09/18/15  0510  09/19/15  0450  09/20/15  0400  WBC  3.0*  1.3*  0.9*  HGB  8.9*  8.4*  7.4*  HCT  27.8*  26.4*  22.9*  PLT  137*  102*  60*    Coag's Recent Labs     09/18/15  0510  09/19/15  0548  09/20/15  0400  APTT  71*  96*  120*    BMET Recent Labs     09/19/15  0450  09/19/15  1615  09/20/15  0400  NA  140  137  139  K  4.3  4.1  4.6  CL  102  99*  101  CO2  '27  26  25  '$ BUN  31*  30*  27*  CREATININE  0.71  0.71  0.76  GLUCOSE  110*  143*  132*    Electrolytes Recent Labs     09/18/15  0510   09/19/15  0450  09/19/15  1615  09/20/15  0400  CALCIUM  9.6   < >  9.2  8.8*  9.4  MG  2.7*   --   2.4   --   2.5*  PHOS  3.0   < >  2.8  3.0  3.7   < > = values in this interval not displayed.    Sepsis Markers No results for input(s): PROCALCITON, O2SATVEN in the last 72 hours.  Invalid input(s): LACTICACIDVEN  ABG Recent Labs     09/18/15  1737  09/18/15  1920  09/18/15  2250  PHART  7.084*  7.087*  7.378  PCO2ART  102*  99.6*  45.5*  PO2ART  120*  111*  136*    Liver Enzymes Recent Labs     09/19/15  0450  09/19/15   1615  09/20/15  0400  ALBUMIN  2.0*  2.1*  1.9*    Cardiac Enzymes No results for input(s): TROPONINI, PROBNP in the last 72 hours.  Glucose Recent Labs     09/18/15  2307  09/19/15  0502  09/19/15  2033  09/20/15  0014  09/20/15  0453  09/20/15  0735  GLUCAP  128*  102*  138*  124*  129*  121*    Imaging Ct Abdomen Pelvis Wo Contrast  09/19/2015  CLINICAL DATA:  Metastatic carcinoma abdominal RIGHT lung mass. Bilateral lung masses. EXAM: CT CHEST, ABDOMEN AND PELVIS WITHOUT CONTRAST TECHNIQUE: Multidetector CT imaging of the chest, abdomen and pelvis was performed following the standard protocol without IV contrast. COMPARISON:  CT 09/15/2015. FINDINGS: CT CHEST FINDINGS Mediastinum/Nodes: No axillary supraclavicular adenopathy. There is bulky mediastinal adenopathy minimally decreased  from prior. For example RIGHT lower paratracheal lymph node measures 25 mm short axis compared to 27 mm. Prevascular lymph node measures 15 mm short axis compared to 20 mm. Lungs/Pleura: Consolidative mass involving the RIGHT lower lobe similar to comparison exam measuring roughly 12 cm by 8.7 cm in axial dimension compared to 14 cm bowel 8.7 cm. The bilateral additional pulmonary masses slightly decreased. Example nodule in the LEFT upper lobe measures 16 mm compared 19 mm. Musculoskeletal: No aggressive osseous lesion. CT ABDOMEN AND PELVIS FINDINGS Hepatobiliary: No focal hepatic lesion on noncontrast exam. Gallbladder is normal. Pancreas: There is fullness in the pancreatic head suggesting edema which is new from prior there is small amount fluid along the LEFT anterior perirenal space. Spleen: Normal spleen Adrenals/urinary tract: Adrenal glands and kidneys are normal. The bladder collapsed around a Foley catheter. Stomach/Bowel: Feeding tube within the stomach. The tube extends to the second portion duodenum. No bowel obstruction. Contrast flows into the ascending colon. Rectal tube noted  Vascular/Lymphatic: Enlarged retroperitoneal nodes again noted. Example node measures 18 mm on image 71. Compares to 19 mm. No new adenopathy. Reproductive: Large mass superior/exophytic from the uterus again demonstrated 10 x 12 cm compared to 9 x 10 cm for some mild increase. Lesion measures 9.9 cm craniocaudal dimension compared to 9.6 cm. Other: The peritoneal metastasis Musculoskeletal: No aggressive osseous lesion. IMPRESSION: Chest Impression: 1. Mild interval decrease in dominant RIGHT lower lobe mass and bilateral additional pulmonary masses and nodules. 2. Mild interval decrease in bulky mediastinal adenopathy. Abdomen / Pelvis Impression: 1. No significant change in retroperitoneal enlarged lymph nodes. 2. New inflammation involving the head and body pancreas. Recommend correlation with pancreatitis. 3. The mass superior to the uterus is increased slightly in size. Enlargement could relate to hemorrhage into the mass. These results will be called to the ordering clinician or representative by the Radiologist Assistant, and communication documented in the PACS or zVision Dashboard. Electronically Signed   By: Suzy Bouchard M.D.   On: 09/19/2015 18:17   Ct Chest Wo Contrast  09/19/2015  CLINICAL DATA:  Metastatic carcinoma abdominal RIGHT lung mass. Bilateral lung masses. EXAM: CT CHEST, ABDOMEN AND PELVIS WITHOUT CONTRAST TECHNIQUE: Multidetector CT imaging of the chest, abdomen and pelvis was performed following the standard protocol without IV contrast. COMPARISON:  CT 09/04/2015. FINDINGS: CT CHEST FINDINGS Mediastinum/Nodes: No axillary supraclavicular adenopathy. There is bulky mediastinal adenopathy minimally decreased from prior. For example RIGHT lower paratracheal lymph node measures 25 mm short axis compared to 27 mm. Prevascular lymph node measures 15 mm short axis compared to 20 mm. Lungs/Pleura: Consolidative mass involving the RIGHT lower lobe similar to comparison exam measuring roughly  12 cm by 8.7 cm in axial dimension compared to 14 cm bowel 8.7 cm. The bilateral additional pulmonary masses slightly decreased. Example nodule in the LEFT upper lobe measures 16 mm compared 19 mm. Musculoskeletal: No aggressive osseous lesion. CT ABDOMEN AND PELVIS FINDINGS Hepatobiliary: No focal hepatic lesion on noncontrast exam. Gallbladder is normal. Pancreas: There is fullness in the pancreatic head suggesting edema which is new from prior there is small amount fluid along the LEFT anterior perirenal space. Spleen: Normal spleen Adrenals/urinary tract: Adrenal glands and kidneys are normal. The bladder collapsed around a Foley catheter. Stomach/Bowel: Feeding tube within the stomach. The tube extends to the second portion duodenum. No bowel obstruction. Contrast flows into the ascending colon. Rectal tube noted Vascular/Lymphatic: Enlarged retroperitoneal nodes again noted. Example node measures 18 mm on image 71. Compares  to 19 mm. No new adenopathy. Reproductive: Large mass superior/exophytic from the uterus again demonstrated 10 x 12 cm compared to 9 x 10 cm for some mild increase. Lesion measures 9.9 cm craniocaudal dimension compared to 9.6 cm. Other: The peritoneal metastasis Musculoskeletal: No aggressive osseous lesion. IMPRESSION: Chest Impression: 1. Mild interval decrease in dominant RIGHT lower lobe mass and bilateral additional pulmonary masses and nodules. 2. Mild interval decrease in bulky mediastinal adenopathy. Abdomen / Pelvis Impression: 1. No significant change in retroperitoneal enlarged lymph nodes. 2. New inflammation involving the head and body pancreas. Recommend correlation with pancreatitis. 3. The mass superior to the uterus is increased slightly in size. Enlargement could relate to hemorrhage into the mass. These results will be called to the ordering clinician or representative by the Radiologist Assistant, and communication documented in the PACS or zVision Dashboard.  Electronically Signed   By: Suzy Bouchard M.D.   On: 09/19/2015 18:17   Dg Chest Port 1 View  09/18/2015  CLINICAL DATA:  Endotracheal tube and OG tube placement. EXAM: PORTABLE CHEST 1 VIEW COMPARISON:  September 18, 2015 FINDINGS: The heart size and mediastinal contours are stable. Endotracheal tube is identified distal tip 2.9 cm from carina. Retraction by 1 cm recommended. A nasogastric tube is identified with distal tip in the mid stomach. Left central venous line is unchanged. Patchy consolidation is identified throughout left lung and visualized right upper lobe unchanged. There is dense consolidation of the right upper/mid and lower lung worse compared to prior exam. The visualized skeletal structures are unremarkable. IMPRESSION: Endotracheal tube distal tip 2.9 cm from carina. Retraction by 1 cm is recommended. Nasogastric tube identified distal tip in the mid stomach. These results will be called to the ordering clinician or representative by the Radiologist Assistant, and communication documented in the PACS or zVision Dashboard. Electronically Signed   By: Abelardo Diesel M.D.   On: 09/18/2015 20:41    CULTURES: 1/13 Blood >> neg 1/14 Pneumococcal Ag >> negative 1/14 Legionella Ag >> negative 1/15 C diff PCR >> negative 1/22 BAL>>>yeast candida only 5 k BCX2 1/21>>>neg  2/4 resp culture >  2/4 blood culture >  2/4 urine culture >   ANTIBIOTICS: 1/13 Rocephin >> 1/13 1/13 Zithromax >> 1/13 1/14 Vancomycin >> 1/19, restart 1/21>>>off 1/14 Zosyn >> 1/20. 1/21 Ceftaz >>>off 2/3 unasyn >>>2/4  2/4 vanc >  2/4 ceftaz >   LINES/TUBES: 1/15 Rt PICC >> 1/24 Left IJ CVL 1/24>>> HD cath 1/27>>>  STUDIES: 1/13 CT chest >> multiple b/l nodules, mass like consolidation Rt mid lung, Rt hilar and subcarinal mass, Lt hilar LAN, 2.7 cm Rt paratracheal LAN 1/14 CT abd/pelvis >> 10 cm mass superior to uterus 1/15 Tumor markers >> CA 19-9 453, CEA 3.9, CA 125 780.8 1/16 Bronchoscopy >>  crush artifact ?small cell lung cancer (non diagnostic) 1/24 lower ext doppler > left DVT peroneal vein 2/4 CT chest abdomen pelvis> Right lung mass smaller, surrounding consolidation noted, left lung nodules smaller, pelvic mass smaller  EVENTS: 1/13 Admit 1/15 Gyn oncology consulted 1/16 Bronchoscopy in ICU; Placed on BiPAP for wheezing, dyspnea 1/19 IR biopsy>>>Poorly differentiated high Grade Carcinoma.  >stains favor Neuroendocrine. TTF stain was NEGATIVE 1/22 bronch cytology: neg  1/28 EEG>>> neg 1/31 weaning  2/1: gave fluid challenge and changed to even volume status.  2/2 passed SBT. Extubated. Re-intubated 2/3 again approached family re: goals of care. They want full support  ASSESSMENT/PLAN:  PULMONARY A: Acute hypoxic respiratory failure 2nd to post-obstructive  pneumonia, metastatic cancer, and wheeze from extrinsic airway compression HCAP on 2/4 CT chest > clear left lung consolidation in lingula and left lower lobe  P: Pulmonary hygeine measures Pressure support ventilation There is no role for tracheostomy here as she has incurable cancer with multi-organ failure See ID  CARDIAC A: Persistent  sinus tachycardia P: Tele Keep volume status net even  RENAL A: AKI> oliguric P: Monitor BMET and UOP Replace electrolytes as needed   GASTROENTEROLOGY A: Nutrition, cdiff neg, loose stools, abdo distention Pancreatic inflammation on CT but lipase normal P: Advance tube feeds Protonix for SUP Immodium  HEMATOLOGY/ONCOLOGY A: 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 Suspect that this is r/t her AKI and uremia  Profound neuromuscular weakness, could this be a paraneoplastic syndrome? P: Neurology consult> is the weakness a paraneoplastic syndrome? Minimize sedation   Family updated bedside  Prognosis dismal  CC time 45 minutes  Roselie Awkward, MD [facility] Pager: [phone] Cell: [phone] After 3pm or if no response, call [phone]

## 2015-09-20 NOTE — Progress Notes (Signed)
ANTICOAGULATION CONSULT NOTE - Follow Up Consult  Pharmacy Consult for Heparin Indication: Subclavian vein and axillary vein DVT  No Known Allergies  Patient Measurements: Height: 5' 5.5" (166.4 cm) Weight: 167 lb 8.8 oz (76 kg) IBW/kg (Calculated) : 58.15 Heparin Dosing Weight:   Vital Signs: Temp: 98.5 F (36.9 C) (02/04 0500) Temp Source: Axillary (02/04 0500) BP: 126/70 mmHg (02/04 0500) Pulse Rate: 131 (02/04 0500)  Labs:  Recent Labs  09/18/15 0510  09/19/15 0450 09/19/15 0548 09/19/15 1615 09/19/15 2155 09/20/15 0400  HGB 8.9*  --  8.4*  --   --   --  7.4*  HCT 27.8*  --  26.4*  --   --   --  22.9*  PLT 137*  --  102*  --   --   --  60*  APTT 71*  --   --  96*  --   --  120*  HEPARINUNFRC 0.57  --   --  0.79* 0.69 0.67 0.63  CREATININE 0.81  < > 0.71  --  0.71  --  0.76  < > = values in this interval not displayed.  Estimated Creatinine Clearance: 96.4 mL/min (by C-G formula based on Cr of 0.76).   Medications:  Infusions:  . heparin 1,850 Units/hr (09/19/15 2309)  . dialysis replacement fluid (prismasate) 400 mL/hr at 09/19/15 2117  . dialysis replacement fluid (prismasate) 200 mL/hr at 09/19/15 2036  . dialysate (PRISMASATE) 2,000 mL/hr at 09/20/15 0416    Assessment: Patient with heparin level at goal x2.  No heparin issues noted.  Goal of Therapy:  Heparin level 0.3-0.7 units/ml Monitor platelets by anticoagulation protocol: Yes   Plan:  Continue heparin drip at current rate Recheck level with 2/5 AM labs  Tyler Deis, Shea Stakes [place] 09/20/2015,6:38 AM

## 2015-09-20 NOTE — Progress Notes (Signed)
CRRT machine alarmed filter clotting at 1221. Pt blood returned and filter changed. CRRT restarted at 1258. EBL 20 cc.

## 2015-09-21 ENCOUNTER — Inpatient Hospital Stay (HOSPITAL_COMMUNITY): Payer: BC Managed Care – PPO

## 2015-09-21 DIAGNOSIS — Z992 Dependence on renal dialysis: Secondary | ICD-10-CM

## 2015-09-21 DIAGNOSIS — G709 Myoneural disorder, unspecified: Secondary | ICD-10-CM

## 2015-09-21 DIAGNOSIS — I749 Embolism and thrombosis of unspecified artery: Secondary | ICD-10-CM

## 2015-09-21 LAB — CBC WITH DIFFERENTIAL/PLATELET
Basophils Absolute: 0 10*3/uL (ref 0.0–0.1)
Basophils Relative: 0 %
EOS PCT: 0 %
Eosinophils Absolute: 0 10*3/uL (ref 0.0–0.7)
HEMATOCRIT: 24.2 % — AB (ref 36.0–46.0)
HEMOGLOBIN: 7.5 g/dL — AB (ref 12.0–15.0)
LYMPHS ABS: 0.3 10*3/uL — AB (ref 0.7–4.0)
Lymphocytes Relative: 25 %
MCH: 27.3 pg (ref 26.0–34.0)
MCHC: 31 g/dL (ref 30.0–36.0)
MCV: 88 fL (ref 78.0–100.0)
MONOS PCT: 5 %
Monocytes Absolute: 0.1 10*3/uL (ref 0.1–1.0)
NEUTROS ABS: 0.7 10*3/uL — AB (ref 1.7–7.7)
Neutrophils Relative %: 70 %
Platelets: 47 10*3/uL — ABNORMAL LOW (ref 150–400)
RBC: 2.75 MIL/uL — ABNORMAL LOW (ref 3.87–5.11)
RDW: 15.8 % — ABNORMAL HIGH (ref 11.5–15.5)
WBC: 1.1 10*3/uL — CL (ref 4.0–10.5)

## 2015-09-21 LAB — RENAL FUNCTION PANEL
ALBUMIN: 1.9 g/dL — AB (ref 3.5–5.0)
ALBUMIN: 1.9 g/dL — AB (ref 3.5–5.0)
ANION GAP: 9 (ref 5–15)
Anion gap: 8 (ref 5–15)
BUN: 29 mg/dL — ABNORMAL HIGH (ref 6–20)
BUN: 32 mg/dL — ABNORMAL HIGH (ref 6–20)
CALCIUM: 9.1 mg/dL (ref 8.9–10.3)
CALCIUM: 9.3 mg/dL (ref 8.9–10.3)
CO2: 27 mmol/L (ref 22–32)
CO2: 27 mmol/L (ref 22–32)
CREATININE: 0.64 mg/dL (ref 0.44–1.00)
Chloride: 102 mmol/L (ref 101–111)
Chloride: 104 mmol/L (ref 101–111)
Creatinine, Ser: 0.64 mg/dL (ref 0.44–1.00)
GFR calc Af Amer: >60 mL/min (ref >60)
GFR calc non Af Amer: >60 mL/min (ref >60)
GLUCOSE: 195 mg/dL — AB (ref 65–99)
GLUCOSE: 157 mg/dL — AB (ref 65–99)
PHOSPHORUS: 2.4 mg/dL — AB (ref 2.5–4.6)
PHOSPHORUS: 3.6 mg/dL (ref 2.5–4.6)
Potassium: 4.2 mmol/L (ref 3.5–5.1)
Potassium: 4 mmol/L (ref 3.5–5.1)
SODIUM: 137 mmol/L (ref 135–145)
Sodium: 140 mmol/L (ref 135–145)

## 2015-09-21 LAB — GLUCOSE, CAPILLARY
GLUCOSE-CAPILLARY: 130 mg/dL — AB (ref 65–99)
GLUCOSE-CAPILLARY: 132 mg/dL — AB (ref 65–99)
Glucose-Capillary: 139 mg/dL — ABNORMAL HIGH (ref 65–99)

## 2015-09-21 LAB — URINE CULTURE
Culture: >=100000
SPECIAL REQUESTS: NORMAL

## 2015-09-21 LAB — APTT: aPTT: 79 seconds — ABNORMAL HIGH (ref 24–37)

## 2015-09-21 LAB — HEPARIN LEVEL (UNFRACTIONATED): Heparin Unfractionated: 0.59 IU/mL (ref 0.30–0.70)

## 2015-09-21 LAB — MAGNESIUM: Magnesium: 2.5 mg/dL — ABNORMAL HIGH (ref 1.7–2.4)

## 2015-09-21 MED ORDER — SODIUM PHOSPHATE 3 MMOLE/ML IV SOLN
20.0000 mmol | Freq: Once | INTRAVENOUS | Status: AC
  Administered 2015-09-21: 20 mmol via INTRAVENOUS
  Filled 2015-09-21: qty 6.67

## 2015-09-21 MED ORDER — NEPRO/CARBSTEADY PO LIQD
1000.0000 mL | ORAL | Status: DC
  Administered 2015-09-21 – 2015-09-28 (×8): 1000 mL via ORAL
  Filled 2015-09-21 (×12): qty 1000

## 2015-09-21 MED ORDER — SODIUM CHLORIDE 0.9 % IV SOLN
INTRAVENOUS | Status: AC
  Administered 2015-09-21: 20:00:00 via INTRAVENOUS
  Administered 2015-09-21: 1000 mL via INTRAVENOUS

## 2015-09-21 NOTE — Progress Notes (Addendum)
PCCM PROGRESS NOTE  ADMISSION DATE: 08/28/2015 CONSULT DATE: 08/30/2015 REFERRING PROVIDER: Dr. Roel Cluck, [facility]  CC: Short of breath  SUBJECTIVE:  PSV 8/5 again today No acute events Seen by neurology  VITAL SIGNS: BP 124/80 mmHg  Pulse 115  Temp(Src) 97.7 F (36.5 C) (Core (Comment))  Resp 21  Ht 5' 5.5" (1.664 m)  Wt 170 lb 3.1 oz (77.2 kg)  BMI 27.88 kg/m2  SpO2 96%  LMP 08/21/2015  INTAKE/OUTPUT: I/O last 3 completed shifts: In: 2852.3 [I.V.:876; Other:26; NG/GT:928.3; IV Piggyback:1022] Out: 2896 [Urine:20; NTIRW:4315; Stool:325]  General: awake HENT: NCAT ETT in place PULM: Rhonchi bilaterally, wheezing, vent supported breaths CV: Tachy, regular GI: BS+, soft, nontender Derm: stage 2 with mild surrounding brown changes sacral area about 4-5 cm total Neuro: Awake on vent, nods head, follows commands, weak grip strength,   CBC Recent Labs     09/19/15  0450  09/20/15  0400  09/21/15  0440  WBC  1.3*  0.9*  1.1*  HGB  8.4*  7.4*  7.5*  HCT  26.4*  22.9*  24.2*  PLT  102*  60*  47*    Coag's Recent Labs     09/19/15  0548  09/20/15  0400  09/21/15  0440  APTT  96*  120*  79*    BMET Recent Labs     09/20/15  0400  09/20/15  1614  09/21/15  0440  NA  139  139  137  K  4.6  4.3  4.2  CL  101  102  102  CO2  '25  27  27  '$ BUN  27*  28*  29*  CREATININE  0.76  0.66  0.64  GLUCOSE  132*  161*  195*    Electrolytes Recent Labs     09/19/15  0450   09/20/15  0400  09/20/15  1614  09/21/15  0440  CALCIUM  9.2   < >  9.4  9.0  9.1  MG  2.4   --   2.5*   --   2.5*  PHOS  2.8   < >  3.7  3.2  2.4*   < > = values in this interval not displayed.    Sepsis Markers No results for input(s): PROCALCITON, O2SATVEN in the last 72 hours.  Invalid input(s): LACTICACIDVEN  ABG Recent Labs     09/18/15  1737  09/18/15  1920  09/18/15  2250  PHART  7.084*  7.087*  7.378  PCO2ART  102*  99.6*  45.5*  PO2ART  120*  111*  136*    Liver  Enzymes Recent Labs     09/20/15  0400  09/20/15  1614  09/21/15  0440  ALBUMIN  1.9*  1.9*  1.9*    Cardiac Enzymes No results for input(s): TROPONINI, PROBNP in the last 72 hours.  Glucose Recent Labs     09/20/15  0453  09/20/15  0735  09/20/15  1149  09/20/15  1524  09/20/15  1948  09/21/15  0030  GLUCAP  129*  121*  128*  138*  118*  139*    Imaging Ct Abdomen Pelvis Wo Contrast  09/19/2015  CLINICAL DATA:  Metastatic carcinoma abdominal RIGHT lung mass. Bilateral lung masses. EXAM: CT CHEST, ABDOMEN AND PELVIS WITHOUT CONTRAST TECHNIQUE: Multidetector CT imaging of the chest, abdomen and pelvis was performed following the standard protocol without IV contrast. COMPARISON:  CT 08/20/2015. FINDINGS: CT CHEST FINDINGS Mediastinum/Nodes: No axillary supraclavicular  adenopathy. There is bulky mediastinal adenopathy minimally decreased from prior. For example RIGHT lower paratracheal lymph node measures 25 mm short axis compared to 27 mm. Prevascular lymph node measures 15 mm short axis compared to 20 mm. Lungs/Pleura: Consolidative mass involving the RIGHT lower lobe similar to comparison exam measuring roughly 12 cm by 8.7 cm in axial dimension compared to 14 cm bowel 8.7 cm. The bilateral additional pulmonary masses slightly decreased. Example nodule in the LEFT upper lobe measures 16 mm compared 19 mm. Musculoskeletal: No aggressive osseous lesion. CT ABDOMEN AND PELVIS FINDINGS Hepatobiliary: No focal hepatic lesion on noncontrast exam. Gallbladder is normal. Pancreas: There is fullness in the pancreatic head suggesting edema which is new from prior there is small amount fluid along the LEFT anterior perirenal space. Spleen: Normal spleen Adrenals/urinary tract: Adrenal glands and kidneys are normal. The bladder collapsed around a Foley catheter. Stomach/Bowel: Feeding tube within the stomach. The tube extends to the second portion duodenum. No bowel obstruction. Contrast flows into the  ascending colon. Rectal tube noted Vascular/Lymphatic: Enlarged retroperitoneal nodes again noted. Example node measures 18 mm on image 71. Compares to 19 mm. No new adenopathy. Reproductive: Large mass superior/exophytic from the uterus again demonstrated 10 x 12 cm compared to 9 x 10 cm for some mild increase. Lesion measures 9.9 cm craniocaudal dimension compared to 9.6 cm. Other: The peritoneal metastasis Musculoskeletal: No aggressive osseous lesion. IMPRESSION: Chest Impression: 1. Mild interval decrease in dominant RIGHT lower lobe mass and bilateral additional pulmonary masses and nodules. 2. Mild interval decrease in bulky mediastinal adenopathy. Abdomen / Pelvis Impression: 1. No significant change in retroperitoneal enlarged lymph nodes. 2. New inflammation involving the head and body pancreas. Recommend correlation with pancreatitis. 3. The mass superior to the uterus is increased slightly in size. Enlargement could relate to hemorrhage into the mass. These results will be called to the ordering clinician or representative by the Radiologist Assistant, and communication documented in the PACS or zVision Dashboard. Electronically Signed   By: Suzy Bouchard M.D.   On: 09/19/2015 18:17   Ct Chest Wo Contrast  09/19/2015  CLINICAL DATA:  Metastatic carcinoma abdominal RIGHT lung mass. Bilateral lung masses. EXAM: CT CHEST, ABDOMEN AND PELVIS WITHOUT CONTRAST TECHNIQUE: Multidetector CT imaging of the chest, abdomen and pelvis was performed following the standard protocol without IV contrast. COMPARISON:  CT 09/01/2015. FINDINGS: CT CHEST FINDINGS Mediastinum/Nodes: No axillary supraclavicular adenopathy. There is bulky mediastinal adenopathy minimally decreased from prior. For example RIGHT lower paratracheal lymph node measures 25 mm short axis compared to 27 mm. Prevascular lymph node measures 15 mm short axis compared to 20 mm. Lungs/Pleura: Consolidative mass involving the RIGHT lower lobe similar  to comparison exam measuring roughly 12 cm by 8.7 cm in axial dimension compared to 14 cm bowel 8.7 cm. The bilateral additional pulmonary masses slightly decreased. Example nodule in the LEFT upper lobe measures 16 mm compared 19 mm. Musculoskeletal: No aggressive osseous lesion. CT ABDOMEN AND PELVIS FINDINGS Hepatobiliary: No focal hepatic lesion on noncontrast exam. Gallbladder is normal. Pancreas: There is fullness in the pancreatic head suggesting edema which is new from prior there is small amount fluid along the LEFT anterior perirenal space. Spleen: Normal spleen Adrenals/urinary tract: Adrenal glands and kidneys are normal. The bladder collapsed around a Foley catheter. Stomach/Bowel: Feeding tube within the stomach. The tube extends to the second portion duodenum. No bowel obstruction. Contrast flows into the ascending colon. Rectal tube noted Vascular/Lymphatic: Enlarged retroperitoneal nodes again noted. Example  node measures 18 mm on image 71. Compares to 19 mm. No new adenopathy. Reproductive: Large mass superior/exophytic from the uterus again demonstrated 10 x 12 cm compared to 9 x 10 cm for some mild increase. Lesion measures 9.9 cm craniocaudal dimension compared to 9.6 cm. Other: The peritoneal metastasis Musculoskeletal: No aggressive osseous lesion. IMPRESSION: Chest Impression: 1. Mild interval decrease in dominant RIGHT lower lobe mass and bilateral additional pulmonary masses and nodules. 2. Mild interval decrease in bulky mediastinal adenopathy. Abdomen / Pelvis Impression: 1. No significant change in retroperitoneal enlarged lymph nodes. 2. New inflammation involving the head and body pancreas. Recommend correlation with pancreatitis. 3. The mass superior to the uterus is increased slightly in size. Enlargement could relate to hemorrhage into the mass. These results will be called to the ordering clinician or representative by the Radiologist Assistant, and communication documented in the  PACS or zVision Dashboard. Electronically Signed   By: Suzy Bouchard M.D.   On: 09/19/2015 18:17    CULTURES: 1/13 Blood >> neg 1/14 Pneumococcal Ag >> negative 1/14 Legionella Ag >> negative 1/15 C diff PCR >> negative 1/22 BAL>>>yeast candida only 5 k BCX2 1/21>>>neg  2/4 resp culture >  2/4 blood culture >  2/4 urine culture >   ANTIBIOTICS: 1/13 Rocephin >> 1/13 1/13 Zithromax >> 1/13 1/14 Vancomycin >> 1/19, restart 1/21>>>off 1/14 Zosyn >> 1/20. 1/21 Ceftaz >>>off 2/3 unasyn >>>2/4  2/4 vanc >  2/4 ceftaz >   LINES/TUBES: 1/15 Rt PICC >> 1/24 Left IJ CVL 1/24>>> HD cath 1/27>>>  STUDIES: 1/13 CT chest >> multiple b/l nodules, mass like consolidation Rt mid lung, Rt hilar and subcarinal mass, Lt hilar LAN, 2.7 cm Rt paratracheal LAN 1/14 CT abd/pelvis >> 10 cm mass superior to uterus 1/15 Tumor markers >> CA 19-9 453, CEA 3.9, CA 125 780.8 1/16 Bronchoscopy >> crush artifact ?small cell lung cancer (non diagnostic) 1/24 lower ext doppler > left DVT peroneal vein 2/4 CT chest abdomen pelvis> Right lung mass smaller, surrounding consolidation noted, left lung nodules smaller, pelvic mass smaller   EVENTS: 1/13 Admit 1/15 Gyn oncology consulted 1/16 Bronchoscopy in ICU; Placed on BiPAP for wheezing, dyspnea 1/19 IR biopsy>>>Poorly differentiated high Grade Carcinoma.  >stains favor Neuroendocrine. TTF stain was NEGATIVE 1/22 bronch cytology: neg  1/25 Etoposide/carboplatin cycle #1 1/28 EEG>>> diffuse slowly (performed while uremic) 1/31 weaning  2/1: gave fluid challenge and changed to even volume status.  2/2 passed SBT. Extubated. Re-intubated 2/3 again approached family re: goals of care. They want full support 2/5 neurology consult >   ASSESSMENT/PLAN:  PULMONARY A: Acute hypoxic respiratory failure due to lung cancer and profound neuromuscular weakness HCAP on 2/4 CT chest > clear left lung consolidation in lingula and left lower  lobe  P: Pulmonary hygeine measures Pressure support ventilation There is no role for tracheostomy here as she has incurable cancer with multi-organ failure See ID  NEUROLOGY A: Cancer pain > minimal Acute Encephalopathy> better after HD but worse again when hypercarbic.  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  They remain hopeful despite her dismal prognosis.  See multiple previous notes from me.  Her profound neuromuscular weakness is her primary reason for her respiratory failure.  If this proves to be due to ICU polyneuropathy then the likelihood that she will recover from that before she dies from complications of her cancer is near zero.  I have expressed my concern that we  are prolonging suffering on 2/1, 2/3, and 2/4.  Her husband is distrustful of the PCCM team and it is difficult to communicate effectively with him regarding end of life measures as he believes that she wants to be on a ventilator and that with further chemotherapy she will be able to breathe on her own.  This is very, very unlikely.  CC time 45 minutes  Roselie Awkward, MD [facility] Pager: [phone] Cell: [phone] After 3pm or if no response, call [phone]

## 2015-09-21 NOTE — Progress Notes (Signed)
[name] is still intubated. She is still on dialysis. She is alert.  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 She has weakness in her muscles. Her diaphragm is also weak and which is probably why she is on the ventilator. Antibiotics will be sent off for paraneoplastic syndromes.  If positive, she may need plasma pheresis. It certainly would be reasonable to think about paraneoplastic syndrome given that she has a neuroendocrine malignancy.  She is on heparin for thrombo-embolic disease. Her plate count is dropping a little bit. I will continue her on the heparin without given her boluses.  She is being fed through a feeding tube. This is a fantastic idea.  She is not making much urine. Her creatinine clearance is looking quite good. Hopefully, the kidneys will "come on line" soon.  Her white cell count is 1.1. She is on Neupogen. Hopefully she will start to improve her white cell count. This will help her immune system.  I don't patient needs a transfusion today. Her hemoglobin is holding pretty steady.  She's had no bleeding. She's had no obvious seizures. She's had liquid stool through the rectal tube. Her labs show her white cell count be 1.1. Hemoglobin 7.5. Platelet count 47,000. Her BUN is 29 and creatinine 0.64. Potassium 4.2. Her albumin is 1.9.  Again, I think a lot of her current clinical scenario could be explained by a paraneoplastic process. Is also sensitive she has myasthenia gravis. It is possible she may have Eaton-Lambert syndrome. I'm sure that the paraneoplastic panel will show [place] this. If not, she may have special blood studies for Voltage Gated Calcium Channels (VGCC).  She will continue on heparin. Her blood count is slowly dropping. I believe this is more related from the chemotherapy. She also can have a drop in platelets from the Neupogen.  It is possible that she may need to have a tracheostomy. It is hard to say how long she will be like this.  If she does have a paraneoplastic process, treating the underlying malignancy should be able to help. Again, plasmapheresis also has been utilized.  It is obvious that she is getting fantastic care down the ICU. Her husband is painful for the compassion and friendliness of the ICU staff.  Monique [name] 59:9

## 2015-09-21 NOTE — Progress Notes (Signed)
ANTICOAGULATION CONSULT NOTE - Follow Up Consult  Pharmacy Consult for Heparin Indication: Subclavian vein and axillary vein DVT  No Known Allergies  Patient Measurements: Height: 5' 5.5" (166.4 cm) Weight: 170 lb 3.1 oz (77.2 kg) IBW/kg (Calculated) : 58.15 Heparin Dosing Weight: 77.2 kg  Vital Signs: Temp: 97.9 F (36.6 C) (02/05 0800) Temp Source: Core (Comment) (02/05 0600) BP: 102/78 mmHg (02/05 0800) Pulse Rate: 115 (02/05 0800)  Labs:  Recent Labs  09/19/15 0450 09/19/15 0548  09/19/15 2155 09/20/15 0400 09/20/15 1025 09/20/15 1614 09/21/15 0440  HGB 8.4*  --   --   --  7.4*  --   --  7.5*  HCT 26.4*  --   --   --  22.9*  --   --  24.2*  PLT 102*  --   --   --  60*  --   --  47*  APTT  --  96*  --   --  120*  --   --  79*  HEPARINUNFRC  --  0.79*  < > 0.67 0.63  --   --  0.59  CREATININE 0.71  --   < >  --  0.76  --  0.66 0.64  CKTOTAL  --   --   --   --   --  611*  --   --   < > = values in this interval not displayed.  Estimated Creatinine Clearance: 97.1 mL/min (by C-G formula based on Cr of 0.64).   Medications:  Scheduled:  . antiseptic oral rinse  7 mL Mouth Rinse QID  . budesonide (PULMICORT) nebulizer solution  0.5 mg Nebulization BID  . cefTAZidime (FORTAZ)  IV  1 g Intravenous Q8H  . chlorhexidine  15 mL Mouth Rinse BID  . feeding supplement (NEPRO CARB STEADY)  1,000 mL Oral Q24H  . feeding supplement (PRO-STAT SUGAR FREE 64)  30 mL Per Tube TID  . filgrastim  480 mcg Subcutaneous q1800  . levalbuterol  0.63 mg Nebulization Q6H  . pantoprazole sodium  40 mg Per Tube Daily  . sodium chloride  10-40 mL Intracatheter Q12H  . sodium chloride  3 mL Intravenous Q12H  . vancomycin  750 mg Intravenous Q24H   Infusions:  . heparin 1,850 Units/hr (09/21/15 0305)  . dialysis replacement fluid (prismasate) 400 mL/hr at 09/20/15 2320  . dialysis replacement fluid (prismasate) 200 mL/hr at 09/20/15 1213  . dialysate (PRISMASATE) 2,000 mL/hr at  09/21/15 0654   PRN: alteplase, alteplase, artificial tears, fentaNYL (SUBLIMAZE) injection, fentaNYL (SUBLIMAZE) injection, heparin, heparin lock flush, heparin lock flush, hydrALAZINE, levalbuterol, loperamide, metoprolol, morphine injection, [DISCONTINUED] ondansetron **OR** ondansetron (ZOFRAN) IV, polyvinyl alcohol, simethicone, sodium chloride, sodium chloride, sodium chloride flush, sodium chloride flush  Assessment: Pt is a 40 yo female diagnosed with DVT noted in the L peroneal vein and RUE at PICC site >> PICC removed. Pharmacy consulted on 1/24 to dose IV heparin.  Today, 09/21/2015  Heparin level therapeutic (0.59) on 1850 units/hr  Hbg (7.5) and Plts (47k) continue to trend down - spoke with Dr. Marin Olp who feels this is due to chemotherapy and is ok to proceed with heparin therapy  No visible bleeding reported per RN  Goal of Therapy:  Heparin level 0.3-0.7 units/ml Monitor platelets by anticoagulation protocol: Yes   Plan:   Continue heparin at 1850 units/hr  Daily heparin level and CBC  Monitor closely for signs of bleeding  Peggyann Juba, PharmD, BCPS Pager: [phone] 09/21/2015,8:51 AM

## 2015-09-21 NOTE — Progress Notes (Signed)
[facility] Progress Note   Subjective: no problems with CRRT.  Running even-  Really No UOP -  Now wondering if she has a paraneoplastic syndrome ?  Filed Vitals:   09/21/15 0700 09/21/15 0800 09/21/15 0900 09/21/15 0947  BP: 106/73 102/78 107/71   Pulse: 121 115 119 127  Temp: 98.2 F (36.8 C) 97.9 F (36.6 C) 97.9 F (36.6 C) 97.9 F (36.6 C)  TempSrc:      Resp: '15 16 15 18  '$ Height:      Weight:      SpO2: 96% 95% 96% 97%    Inpatient medications: . antiseptic oral rinse  7 mL Mouth Rinse QID  . budesonide (PULMICORT) nebulizer solution  0.5 mg Nebulization BID  . cefTAZidime (FORTAZ)  IV  1 g Intravenous Q8H  . chlorhexidine  15 mL Mouth Rinse BID  . feeding supplement (NEPRO CARB STEADY)  1,000 mL Oral Q24H  . feeding supplement (PRO-STAT SUGAR FREE 64)  30 mL Per Tube TID  . filgrastim  480 mcg Subcutaneous q1800  . levalbuterol  0.63 mg Nebulization Q6H  . pantoprazole sodium  40 mg Per Tube Daily  . sodium chloride  10-40 mL Intracatheter Q12H  . sodium chloride  3 mL Intravenous Q12H  . vancomycin  750 mg Intravenous Q24H   . heparin 1,850 Units/hr (09/21/15 0305)  . dialysis replacement fluid (prismasate) 400 mL/hr at 09/20/15 2320  . dialysis replacement fluid (prismasate) 200 mL/hr at 09/20/15 1213  . dialysate (PRISMASATE) 2,000 mL/hr at 09/21/15 0935   alteplase, alteplase, artificial tears, fentaNYL (SUBLIMAZE) injection, fentaNYL (SUBLIMAZE) injection, heparin, heparin lock flush, heparin lock flush, hydrALAZINE, levalbuterol, loperamide, metoprolol, morphine injection, [DISCONTINUED] ondansetron **OR** ondansetron (ZOFRAN) IV, polyvinyl alcohol, simethicone, sodium chloride, sodium chloride, sodium chloride flush, sodium chloride flush  Exam: More alert  No jvd Chest coarse BS bilat RRR no mrg Abd soft mod distended no mass GU foley in place Ext diffuse edema but better  x 4- left femoral cath placed 1/27 Neuro not responding    CXR RML consolidation, scattered nodular changes UA 1/21 > protein neg, 0-5 rbc/wbc's   Assessment: 1 Acute renal failure - suspect ATN, oliguric/anuric. CRRT day #10. Uremia better along with volume- to continue CRRT for now- no heparin in machine but on heparin drip for DVTs.  Timing of trying to stop dialysis support is very tricky- failed extubation- I am not sure once we stop that she will pick up UOP right away so hesitant to stop it because we do not have the luxury of allowing her volume or metabolic status to get worse even temporarily- but I do know it will need to be stopped eventually- maybe when we need to change the line or if UOP does start to come.    did get carboplatin so possibly nephrotox from that.   2 Vol excess  - taking a break from UF right now given tachy - run even -  Will bolus her outside the machine today  3 Anemia Hb 7.4- from chemo  Transfusion in past- transfuse as needed 4 Metastatic cancer, neuroendocrine type-  SP chemo x 1 on 1/25- carboplatin- obviously a complicating factor ? paraneoplastic process 5 Resp failure - PNA/ nodular mets, + vol excess. FiO2 down 0.40,. CXR better w vol removal 6. Elytes- OK - all 4 K dialysate - phos down some- repleted 2/3- will replete again   Yassin Scales A   09/21/2015, 10:35 AM    Recent Labs  Lab 09/20/15 0400 09/20/15 1614 09/21/15 0440  NA 139 139 137  K 4.6 4.3 4.2  CL 101 102 102  CO2 '25 27 27  '$ GLUCOSE 132* 161* 195*  BUN 27* 28* 29*  CREATININE 0.76 0.66 0.64  CALCIUM 9.4 9.0 9.1  PHOS 3.7 3.2 2.4*    Recent Labs Lab 09/15/15 0510  09/16/15 0421  09/20/15 0400 09/20/15 1614 09/21/15 0440  AST 214*  --  188*  --   --   --   --   ALT 84*  --  102*  --   --   --   --   ALKPHOS 460*  --  561*  --   --   --   --   BILITOT 1.8*  --  1.1  --   --   --   --   PROT 6.1*  --  7.0  --   --   --   --   ALBUMIN 1.8*  1.9*  < > 2.0*  2.0*  < > 1.9* 1.9* 1.9*  < > = values in this interval not  displayed.  Recent Labs Lab 09/19/15 0450 09/20/15 0400 09/21/15 0440  WBC 1.3* 0.9* 1.1*  NEUTROABS 0.8* 0.4* 0.7*  HGB 8.4* 7.4* 7.5*  HCT 26.4* 22.9* 24.2*  MCV 86.6 85.1 88.0  PLT 102* 60* 47*

## 2015-09-22 ENCOUNTER — Inpatient Hospital Stay (HOSPITAL_COMMUNITY): Payer: BC Managed Care – PPO

## 2015-09-22 DIAGNOSIS — R531 Weakness: Secondary | ICD-10-CM

## 2015-09-22 DIAGNOSIS — J9601 Acute respiratory failure with hypoxia: Secondary | ICD-10-CM

## 2015-09-22 LAB — RENAL FUNCTION PANEL
ALBUMIN: 1.9 g/dL — AB (ref 3.5–5.0)
ANION GAP: 13 (ref 5–15)
ANION GAP: 8 (ref 5–15)
Albumin: 2 g/dL — ABNORMAL LOW (ref 3.5–5.0)
BUN: 32 mg/dL — ABNORMAL HIGH (ref 6–20)
BUN: 35 mg/dL — AB (ref 6–20)
CHLORIDE: 104 mmol/L (ref 101–111)
CHLORIDE: 103 mmol/L (ref 101–111)
CO2: 24 mmol/L (ref 22–32)
CO2: 27 mmol/L (ref 22–32)
Calcium: 9.7 mg/dL (ref 8.9–10.3)
Calcium: 9.5 mg/dL (ref 8.9–10.3)
Creatinine, Ser: 0.59 mg/dL (ref 0.44–1.00)
Creatinine, Ser: 0.67 mg/dL (ref 0.44–1.00)
GFR calc Af Amer: >60 mL/min (ref >60)
GFR calc Af Amer: >60 mL/min (ref >60)
GFR calc non Af Amer: >60 mL/min (ref >60)
GFR calc non Af Amer: >60 mL/min (ref >60)
GLUCOSE: 149 mg/dL — AB (ref 65–99)
GLUCOSE: 141 mg/dL — AB (ref 65–99)
PHOSPHORUS: 2.2 mg/dL — AB (ref 2.5–4.6)
POTASSIUM: 4.1 mmol/L (ref 3.5–5.1)
POTASSIUM: 4 mmol/L (ref 3.5–5.1)
Phosphorus: 2.4 mg/dL — ABNORMAL LOW (ref 2.5–4.6)
Sodium: 141 mmol/L (ref 135–145)
Sodium: 138 mmol/L (ref 135–145)

## 2015-09-22 LAB — CBC WITH DIFFERENTIAL/PLATELET
Basophils Absolute: 0 10*3/uL (ref 0.0–0.1)
Basophils Relative: 0 %
EOS PCT: 0 %
Eosinophils Absolute: 0 10*3/uL (ref 0.0–0.7)
HEMATOCRIT: 23.2 % — AB (ref 36.0–46.0)
Hemoglobin: 7.2 g/dL — ABNORMAL LOW (ref 12.0–15.0)
LYMPHS ABS: 0.3 10*3/uL — AB (ref 0.7–4.0)
Lymphocytes Relative: 34 %
MCH: 27.6 pg (ref 26.0–34.0)
MCHC: 31 g/dL (ref 30.0–36.0)
MCV: 88.9 fL (ref 78.0–100.0)
MONO ABS: 0.1 10*3/uL (ref 0.1–1.0)
Monocytes Relative: 10 %
NEUTROS ABS: 0.6 10*3/uL — AB (ref 1.7–7.7)
NEUTROS PCT: 56 %
Platelets: 29 10*3/uL — CL (ref 150–400)
RBC: 2.61 MIL/uL — ABNORMAL LOW (ref 3.87–5.11)
RDW: 15.9 % — AB (ref 11.5–15.5)
WBC: 1 10*3/uL — AB (ref 4.0–10.5)

## 2015-09-22 LAB — MAGNESIUM: MAGNESIUM: 2.5 mg/dL — AB (ref 1.7–2.4)

## 2015-09-22 LAB — HEPARIN LEVEL (UNFRACTIONATED): Heparin Unfractionated: 0.55 IU/mL (ref 0.30–0.70)

## 2015-09-22 LAB — MISC LABCORP TEST (SEND OUT): Labcorp test code: 808968

## 2015-09-22 LAB — APTT: aPTT: 98 seconds — ABNORMAL HIGH (ref 24–37)

## 2015-09-22 MED ORDER — PROPOFOL 1000 MG/100ML IV EMUL
INTRAVENOUS | Status: AC
  Filled 2015-09-22: qty 100

## 2015-09-22 MED ORDER — PROPOFOL 1000 MG/100ML IV EMUL
5.0000 ug/kg/min | INTRAVENOUS | Status: DC
  Administered 2015-09-22: 5 ug/kg/min via INTRAVENOUS

## 2015-09-22 NOTE — Progress Notes (Signed)
While changing the patients central line dressing, I noted the patient was unable to keep her head still and turned to the side for greater than a second at a time. I asked the patient if she was purposefully moving her head and she shook her head side to side indicating "no". I asked the patient if her head was moving involuntarily and she shook her head up and down indicating "yes". I asked the patient if the movement was causing any discomfort and she shook her head side to side indicating "no". I had noted the movement on initial examination, but thought it was purposeful movement at that time. The patients husband stated that he had noticed the movement for the past few days ("since she had been intubated again"), but thought they were purposeful movements, as well. The movement does not appear to be causing the patient any pain or distress at this time. The patient is not having any involuntary movements in any other location that I can determine or that she is aware of at this time. Will continue to monitor. Luther Parody, RN

## 2015-09-22 NOTE — Progress Notes (Signed)
   09/22/15 1100  Clinical Encounter Type  Visited With Patient and family together  Visit Type Follow-up;Psychological support;Spiritual support;Critical Care  Spiritual Encounters  Spiritual Needs Prayer;Emotional;Other (Comment) (Pastoral Conversation/Support)  Stress Factors  Patient Stress Factors Not reviewed  Family Stress Factors Health changes;Other (Comment);Lack of knowledge   I followed up with the patient and her family for spiritual and emotional support. The patient's husband, brother and mother-in-law were in the room. The patient was asleep, but could be awoke and nodded up and down when asked if she remembered me. While we were talking about her son, she would nod up and down for yes or side to side for no; as if she knew what we were talking about.  The husband stated that they were waiting for an MRI to be done today and that they were hoping for good results. He stated that his belief was that the cancer had gotten better due to the chemo that the patient received.  The husband still remains hopeful that the patient will get better and doesn't not seem to be able to grasp the severity of her situation.  The patient continues to get visits from her son, who has been leaving stuffed toys for her.  The family requested prayer, which we did together with the patient.  Follow-up will continue.    [place] M.Div.

## 2015-09-22 NOTE — Progress Notes (Signed)
MRI tech notified this RN that C-spine MRI unable to be completed if patient is having involuntary movements of neck and head.

## 2015-09-22 NOTE — Progress Notes (Signed)
Date: September 22, 2015 Chart reviewed for concurrent status and case management needs. Will continue to follow patient for changes and needs: remains on vent Velva Harman, BSN, Therapist, sports, [place]   [phone]

## 2015-09-22 NOTE — Progress Notes (Signed)
IP PROGRESS NOTE  Subjective:   She remains on the ventilator. She is alert, but has persistent extremity weakness.  Objective: Vital signs in last 24 hours: Blood pressure 144/91, pulse 120, temperature 97.7 F (36.5 C), temperature source Core (Comment), resp. rate 22, height 5' 5.5" (1.664 m), weight 171 lb 15.3 oz (78 kg), last menstrual period 08/21/2015, SpO2 98 %.  Intake/Output from previous day: 02/05 0701 - 02/06 0700 In: 3438.3 [I.V.:1693.8; NG/GT:1200; IV Piggyback:544.5] Out: 1923 [Urine:8; Stool:400]  Physical Exam:  HEENT: ET tube in place Lungs: Bilateral rhonchi Neck: Distant heart sounds Abdomen: Soft, tender in the low abdomen Extremities: No edema Neurologic: Opens eyes, squeeze his hands to command, not moving the feet  Left IJ catheter site without erythema  Lab Results:  Recent Labs  09/21/15 0440 09/22/15 0400  WBC 1.1* 1.0*  HGB 7.5* 7.2*  HCT 24.2* 23.2*  PLT 47* 29*   ANC 0.6   BMET  Recent Labs  09/21/15 1600 09/22/15 0400  NA 140 141  K 4.0 4.1  CL 104 104  CO2 27 24  GLUCOSE 157* 149*  BUN 32* 32*  CREATININE 0.64 0.59  CALCIUM 9.3 9.7    Studies/Results: Dg Abd 1 View  09/21/2015  CLINICAL DATA:  40 year old female status post enteric tube placement. Initial encounter. Metastatic carcinoma. Initial encounter. EXAM: ABDOMEN - 1 VIEW COMPARISON:  CT Abdomen and Pelvis 09/19/2015 and earlier. FINDINGS: Portable supine views of the abdomen at 1403 hours. NG type tube in place, side hole to level of the distal stomach (arrow). Partially visible left IJ approach central line at the lower mediastinum. Continued abnormal right lung base, see comparison CT Abdomen and Pelvis. Non obstructed bowel gas pattern. Oral contrast administered on 09/19/2015 is now in the colon. No definite pneumoperitoneum on this supine view. Left femoral approach venous catheter re - demonstrated, incidentally on recent CT Abdomen and Pelvis the catheter tip  was within a small lumbar vein. IMPRESSION: 1. Enteric tube side hole the distal stomach. 2.  Non obstructed bowel gas pattern. 3. Left femoral venous catheter tip within a small lumbar vein recent CT. If poor flows recommend repositioning (if the catheter could be retracted 4 cm it would terminate within the larger left external iliac vein). Electronically Signed   By: Genevie Ann M.D.   On: 09/21/2015 14:36   Dg Chest Port 1 View  09/22/2015  CLINICAL DATA:  Hypoxia/respiratory failure EXAM: PORTABLE CHEST 1 VIEW COMPARISON:  Chest radiograph September 18, 2015 and chest CT September 19, 2015 FINDINGS: Endotracheal tube tip is 1.9 cm above the carina. Central catheter tip is in the superior vena cava near the cavoatrial junction. Nasogastric tube tip and side port are below the diaphragm. No pneumothorax. The previously noted right pleural effusion is smaller compared to recent prior studies. There is residual right effusion with right base atelectasis. The known mass in the right base is ill-defined on this examination, likely with surrounding patchy pneumonitis. Multiple ill-defined nodular lesions are noted bilaterally consistent with metastatic foci. No new opacity is apparent compared to recent prior studies. Heart size and pulmonary vascularity are normal. No bone lesions apparent. Adenopathy noted on recent CT examination is less apparent on current portable chest radiograph. IMPRESSION: Tube and catheter positions as described without pneumothorax. Right effusion smaller compared to recent prior studies. Extensive metastatic disease remains. The dominant mass in the right base is ill-defined and not well delineated on the current examination, in part due to surrounding apparent  pneumonitis. No new opacity apparent. Electronically Signed   By: Lowella Grip III M.D.   On: 09/22/2015 07:12    Medications: I have reviewed the patient's current medications.  Assessment/Plan:  1. Metastatic carcinoma with  a dominant right lung mass, bilateral lung masses, chest/abdominal lymphadenopathy, and a pelvic mass  Bronchoscopy 09/01/2015 revealed endobronchial lesions at the right upper lobe and right middle lobe, biopsies revealed malignant cells without a definitive diagnosis  CT-guided biopsy of a respiratory lymph node 09/04/2015 confirmed poorly differential carcinoma, with some features of a neuroendocrine carcinoma.  Cycle 1 etoposide/carboplatin 09/10/2015  CTs chest, abdomen, and pelvis on 09/19/2015-mild decrease in dominant right lower lobe mass, new cell adenopathy, and pulmonary masses. Slight enlargement of the pelvic mass.  2. Respiratory failure secondary to #1 and pneumonia, and possibly motor weakness secondary to deconditioning or a paraneoplastic syndrome  3. Renal failure-maintained on CRRT   4. Left leg and right upper extremity DVTs 09/09/2015, on heparin  5.  Altered mental status secondary to uremia and respiratory failure-improved  6.  Anemia secondary to phlebotomy, metastatic carcinoma, and renal failure, status post a red cell transfusion 09/15/2015  7.  Leukopenia/thrombocytopenia secondary to chemotherapy, G-CSF started 09/20/2015  8.  Extremity weakness-potentially related to deconditioning, a paraneoplastic syndrome, or cervical spine disease-evaluation/management per neurology  Ms. Rotter is now at day 13 following a first cycle of etoposide/carboplatin. She has progressive thrombocytopenia secondary to chemotherapy. The neutral count is stable.  She has multiorgan failure and profound motor weakness. Neurology is evaluating her for a paraneoplastic syndrome and cervical spine disease.  Her prognosis remains poor. I agree with a palliative care consult.  Recommendations: 1. Continue management of respiratory failure per critical care medicine 2. CRRT per nephrology 3. Daily CBC/differential, continue G-CSF 4. Discontinue heparin with the low platelet  count-discussed with her husband and critical care team  LOS: 23 days   Latrel Szymczak  09/22/2015, 8:11 AM

## 2015-09-22 NOTE — Progress Notes (Signed)
PCCM PROGRESS NOTE  ADMISSION DATE: 09/08/2015 CONSULT DATE: 08/30/2015 REFERRING PROVIDER: Dr. Roel Cluck, [facility]  CC: Short of breath  SUBJECTIVE:  PSV 8/5 again today No acute events Seen by neurology  VITAL SIGNS: BP 144/91 mmHg  Pulse 120  Temp(Src) 97.7 F (36.5 C) (Core (Comment))  Resp 22  Ht 5' 5.5" (1.664 m)  Wt 78 kg (171 lb 15.3 oz)  BMI 28.17 kg/m2  SpO2 98%  LMP 09/10/2015  INTAKE/OUTPUT: I/O last 3 completed shifts: In: 4337.6 [I.V.:1933.8; NG/GT:1759.3; IV Piggyback:644.5] Out: 2827 [Urine:17; XBWIO:0355; Stool:475]  General: awake HENT: NCAT ETT in place PULM: Rhonchi bilaterally, wheezing, vent supported breaths CV: Tachy, regular GI: BS+, soft, nontender Derm: stage 2 with mild surrounding brown changes sacral area about 4-5 cm total Neuro: Awake on vent, nods head, follows commands, weak grip strength,   CBC Recent Labs     09/20/15  0400  09/21/15  0440  09/22/15  0400  WBC  0.9*  1.1*  1.0*  HGB  7.4*  7.5*  7.2*  HCT  22.9*  24.2*  23.2*  PLT  60*  47*  29*    Coag's Recent Labs     09/20/15  0400  09/21/15  0440  09/22/15  0400  APTT  120*  79*  98*    BMET Recent Labs     09/21/15  0440  09/21/15  1600  09/22/15  0400  NA  137  140  141  K  4.2  4.0  4.1  CL  102  104  104  CO2  '27  27  24  '$ BUN  29*  32*  32*  CREATININE  0.64  0.64  0.59  GLUCOSE  195*  157*  149*    Electrolytes Recent Labs     09/20/15  0400   09/21/15  0440  09/21/15  1600  09/22/15  0400  CALCIUM  9.4   < >  9.1  9.3  9.7  MG  2.5*   --   2.5*   --   2.5*  PHOS  3.7   < >  2.4*  3.6  2.2*   < > = values in this interval not displayed.    Sepsis Markers No results for input(s): PROCALCITON, O2SATVEN in the last 72 hours.  Invalid input(s): LACTICACIDVEN  ABG No results for input(s): PHART, PCO2ART, PO2ART in the last 72 hours.  Liver Enzymes Recent Labs     09/21/15  0440  09/21/15  1600  09/22/15  0400  ALBUMIN  1.9*  1.9*   1.9*    Cardiac Enzymes No results for input(s): TROPONINI, PROBNP in the last 72 hours.  Glucose Recent Labs     09/20/15  1149  09/20/15  1524  09/20/15  1948  09/21/15  0030  09/21/15  0801  09/21/15  1559  GLUCAP  128*  138*  118*  139*  130*  132*    Imaging Dg Abd 1 View  09/21/2015  CLINICAL DATA:  40 year old female status post enteric tube placement. Initial encounter. Metastatic carcinoma. Initial encounter. EXAM: ABDOMEN - 1 VIEW COMPARISON:  CT Abdomen and Pelvis 09/19/2015 and earlier. FINDINGS: Portable supine views of the abdomen at 1403 hours. NG type tube in place, side hole to level of the distal stomach (arrow). Partially visible left IJ approach central line at the lower mediastinum. Continued abnormal right lung base, see comparison CT Abdomen and Pelvis. Non obstructed bowel gas pattern. Oral contrast administered  on 09/19/2015 is now in the colon. No definite pneumoperitoneum on this supine view. Left femoral approach venous catheter re - demonstrated, incidentally on recent CT Abdomen and Pelvis the catheter tip was within a small lumbar vein. IMPRESSION: 1. Enteric tube side hole the distal stomach. 2.  Non obstructed bowel gas pattern. 3. Left femoral venous catheter tip within a small lumbar vein recent CT. If poor flows recommend repositioning (if the catheter could be retracted 4 cm it would terminate within the larger left external iliac vein). Electronically Signed   By: Genevie Ann M.D.   On: 09/21/2015 14:36   Dg Chest Port 1 View  09/22/2015  CLINICAL DATA:  Hypoxia/respiratory failure EXAM: PORTABLE CHEST 1 VIEW COMPARISON:  Chest radiograph September 18, 2015 and chest CT September 19, 2015 FINDINGS: Endotracheal tube tip is 1.9 cm above the carina. Central catheter tip is in the superior vena cava near the cavoatrial junction. Nasogastric tube tip and side port are below the diaphragm. No pneumothorax. The previously noted right pleural effusion is smaller compared  to recent prior studies. There is residual right effusion with right base atelectasis. The known mass in the right base is ill-defined on this examination, likely with surrounding patchy pneumonitis. Multiple ill-defined nodular lesions are noted bilaterally consistent with metastatic foci. No new opacity is apparent compared to recent prior studies. Heart size and pulmonary vascularity are normal. No bone lesions apparent. Adenopathy noted on recent CT examination is less apparent on current portable chest radiograph. IMPRESSION: Tube and catheter positions as described without pneumothorax. Right effusion smaller compared to recent prior studies. Extensive metastatic disease remains. The dominant mass in the right base is ill-defined and not well delineated on the current examination, in part due to surrounding apparent pneumonitis. No new opacity apparent. Electronically Signed   By: Lowella Grip III M.D.   On: 09/22/2015 07:12    CULTURES: 1/13 Blood >> neg 1/14 Pneumococcal Ag >> negative 1/14 Legionella Ag >> negative 1/15 C diff PCR >> negative 1/22 BAL>>>yeast candida only 5 k BCX2 1/21>>>neg  2/4 resp culture > GPC pairs >> 2/4 blood culture >  2/4 urine culture > yeast >>  ANTIBIOTICS: 1/13 Rocephin >> 1/13 1/13 Zithromax >> 1/13 1/14 Vancomycin >> 1/19, restart 1/21>>>off 1/14 Zosyn >> 1/20. 1/21 Ceftaz >>>off 2/3 unasyn >>>2/4  2/4 vanc >  2/4 ceftaz >   LINES/TUBES: 1/15 Rt PICC >> 1/24 Left IJ CVL 1/24>>> HD cath 1/27>>>  STUDIES: 1/13 CT chest >> multiple b/l nodules, mass like consolidation Rt mid lung, Rt hilar and subcarinal mass, Lt hilar LAN, 2.7 cm Rt paratracheal LAN 1/14 CT abd/pelvis >> 10 cm mass superior to uterus 1/15 Tumor markers >> CA 19-9 453, CEA 3.9, CA 125 780.8 1/16 Bronchoscopy >> crush artifact ?small cell lung cancer (non diagnostic) 1/24 lower ext doppler > left DVT peroneal vein 2/4 CT chest abdomen pelvis> Right lung mass smaller,  surrounding consolidation noted, left lung nodules smaller, pelvic mass smaller   EVENTS: 1/13 Admit 1/15 Gyn oncology consulted 1/16 Bronchoscopy in ICU; Placed on BiPAP for wheezing, dyspnea 1/19 IR biopsy>>>Poorly differentiated high Grade Carcinoma.  >stains favor Neuroendocrine. TTF stain was NEGATIVE 1/22 bronch cytology: neg  1/25 Etoposide/carboplatin cycle #1 1/28 EEG>>> diffuse slowly (performed while uremic) 1/31 weaning  2/1: gave fluid challenge and changed to even volume status.  2/2 passed SBT. Extubated. Re-intubated 2/3 again approached family re: goals of care. They want full support 2/5 neurology consult >   ASSESSMENT/PLAN:  PULMONARY A: Acute hypoxic respiratory failure due to lung cancer and profound neuromuscular weakness HCAP on 2/4 CT chest > clear left lung consolidation in lingula and left lower lobe  P: Pulmonary hygeine measures Pressure support as tolerated There is no role for tracheostomy here as she has incurable cancer with multi-organ failure  NEUROLOGY A: Cancer pain > minimal Acute Encephalopathy> better after HD but worse again when hypercarbic.  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 vs less likely auto-immune mediated from malignancy (Eaton-Lambert, myasthenia, etc) P: Await results of auto-antibodies sent by Neurology Await MRI c-spine Minimize sedation per family request but may need sedation for MRI - versed vs propofol  CARDIAC A: Persistent  sinus tachycardia P: Tele Keep volume status net even  RENAL A: AKI> oliguric P: Monitor BMET and UOP Replace electrolytes as needed Can give break on CRRT in my opinion   GASTROENTEROLOGY A: Nutrition, cdiff neg, loose stools, abdo distention Pancreatic inflammation on CT but lipase normal P:  tube feeds @ goal Protonix for SUP Imodium  HEMATOLOGY/ONCOLOGY A: 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  They remain hopeful despite her dismal prognosis.  See multiple previous notes from [facility].   Summary - Her profound neuromuscular weakness is her primary reason for her respiratory failure.  DD incl  ICU polyneuropathy vs paraneoplastic. Prognosis very poor. Will get palliative care involved if husband willing Also trust issue here   The patient is critically ill with multiple organ systems failure and requires high complexity decision making for assessment and support, frequent evaluation and titration of therapies, application of advanced monitoring technologies and extensive interpretation of multiple databases. Critical Care Time devoted to patient care services described in this note independent of APP time is 35 minutes.   [place] [phone]

## 2015-09-22 NOTE — Progress Notes (Signed)
Patient was manually ventilated via BVM to MRI. MRI at bedside when arrived and patient was placed on PRVC 450/14/+5/100% throughout procedure. There were no complications noted in transport to, during, and return from procedure. Patient returned to vent at bedside in 1224 on full support settings. Patient is currently comfortable and no complications noted at this time. RN at bedside with patient and husband. RT will continue to monitor patient.

## 2015-09-22 NOTE — Progress Notes (Addendum)
[facility] Progress Note   Subjective: 3.4L in and 1.8 L out. Wts up to 78 kg. CVP 4- 10  Filed Vitals:   09/22/15 0500 09/22/15 0600 09/22/15 0700 09/22/15 0743  BP: 132/89 123/79 144/91 144/91  Pulse: 114 116 123 120  Temp: 97.7 F (36.5 C) 97.9 F (36.6 C) 97.7 F (36.5 C)   TempSrc: Core (Comment) Core (Comment) Core (Comment)   Resp: '19 17 17 22  '$ Height:      Weight:      SpO2: 97% 97% 95% 98%    Inpatient medications: . antiseptic oral rinse  7 mL Mouth Rinse QID  . budesonide (PULMICORT) nebulizer solution  0.5 mg Nebulization BID  . cefTAZidime (FORTAZ)  IV  1 g Intravenous Q8H  . chlorhexidine  15 mL Mouth Rinse BID  . feeding supplement (NEPRO CARB STEADY)  1,000 mL Oral Q24H  . feeding supplement (PRO-STAT SUGAR FREE 64)  30 mL Per Tube TID  . filgrastim  480 mcg Subcutaneous q1800  . levalbuterol  0.63 mg Nebulization Q6H  . pantoprazole sodium  40 mg Per Tube Daily  . sodium chloride  10-40 mL Intracatheter Q12H  . sodium chloride  3 mL Intravenous Q12H  . vancomycin  750 mg Intravenous Q24H   . heparin 1,850 Units/hr (09/22/15 0818)  . dialysis replacement fluid (prismasate) 400 mL/hr at 09/22/15 0133  . dialysis replacement fluid (prismasate) 200 mL/hr at 09/21/15 1306  . dialysate (PRISMASATE) 2,000 mL/hr at 09/22/15 0634   alteplase, alteplase, artificial tears, fentaNYL (SUBLIMAZE) injection, fentaNYL (SUBLIMAZE) injection, heparin, heparin lock flush, heparin lock flush, hydrALAZINE, levalbuterol, loperamide, metoprolol, morphine injection, [DISCONTINUED] ondansetron **OR** ondansetron (ZOFRAN) IV, polyvinyl alcohol, simethicone, sodium chloride, sodium chloride, sodium chloride flush, sodium chloride flush  Exam: More alert, opens eyes, nods head in response to voice No jvd Chest coarse BS bilat RRR no mrg Abd soft mod distended no mass GU foley in place Trace LE edema/ UE edema bilat Left femoral cath placed 1/27 Neuro as  above     CXR 2/6 RLL opacity and lung masses w no change, no CHF  Assessment: 1 Acute renal failure - oliguric, suspected ATN. 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    Kelly Splinter MD [facility] pager [phone]    cell [phone] 09/22/2015, 8:20 AM    Recent Labs Lab 09/21/15 0440 09/21/15 1600 09/22/15 0400  NA 137 140 141  K 4.2 4.0 4.1  CL 102 104 104  CO2 '27 27 24  '$ GLUCOSE 195* 157* 149*  BUN 29* 32* 32*  CREATININE 0.64 0.64 0.59  CALCIUM 9.1 9.3 9.7  PHOS 2.4* 3.6 2.2*    Recent Labs Lab 09/16/15 0421  09/21/15 0440 09/21/15 1600 09/22/15 0400  AST 188*  --   --   --   --   ALT 102*  --   --   --   --   ALKPHOS 561*  --   --   --   --   BILITOT 1.1  --   --   --   --   PROT 7.0  --   --   --   --  ALBUMIN 2.0*  2.0*  < > 1.9* 1.9* 1.9*  < > = values in this interval not displayed.  Recent Labs Lab 09/20/15 0400 09/21/15 0440 09/22/15 0400  WBC 0.9* 1.1* 1.0*  NEUTROABS 0.4* 0.7* 0.6*  HGB 7.4* 7.5* 7.2*  HCT 22.9* 24.2* 23.2*  MCV 85.1 88.0 88.9  PLT 60* 47* 29*

## 2015-09-23 ENCOUNTER — Inpatient Hospital Stay (HOSPITAL_COMMUNITY): Payer: BC Managed Care – PPO

## 2015-09-23 DIAGNOSIS — D701 Agranulocytosis secondary to cancer chemotherapy: Secondary | ICD-10-CM

## 2015-09-23 DIAGNOSIS — I82622 Acute embolism and thrombosis of deep veins of left upper extremity: Secondary | ICD-10-CM

## 2015-09-23 DIAGNOSIS — Z515 Encounter for palliative care: Secondary | ICD-10-CM

## 2015-09-23 LAB — HEPATIC FUNCTION PANEL
ALBUMIN: 2.1 g/dL — AB (ref 3.5–5.0)
ALK PHOS: 295 U/L — AB (ref 38–126)
ALT: 74 U/L — ABNORMAL HIGH (ref 14–54)
AST: 50 U/L — ABNORMAL HIGH (ref 15–41)
Bilirubin, Direct: 0.2 mg/dL (ref 0.1–0.5)
Indirect Bilirubin: 0.6 mg/dL (ref 0.3–0.9)
TOTAL PROTEIN: 6.4 g/dL — AB (ref 6.5–8.1)
Total Bilirubin: 0.8 mg/dL (ref 0.3–1.2)

## 2015-09-23 LAB — CBC WITH DIFFERENTIAL/PLATELET
BASOS ABS: 0 10*3/uL (ref 0.0–0.1)
Basophils Relative: 1 %
EOS ABS: 0 10*3/uL (ref 0.0–0.7)
Eosinophils Relative: 0 %
HEMATOCRIT: 24.2 % — AB (ref 36.0–46.0)
Hemoglobin: 7.5 g/dL — ABNORMAL LOW (ref 12.0–15.0)
LYMPHS PCT: 45 %
Lymphs Abs: 0.7 10*3/uL (ref 0.7–4.0)
MCH: 27.4 pg (ref 26.0–34.0)
MCHC: 31 g/dL (ref 30.0–36.0)
MCV: 88.3 fL (ref 78.0–100.0)
Monocytes Absolute: 0.1 10*3/uL (ref 0.1–1.0)
Monocytes Relative: 9 %
NEUTROS PCT: 45 %
Neutro Abs: 0.8 10*3/uL — ABNORMAL LOW (ref 1.7–7.7)
Platelets: 18 10*3/uL — CL (ref 150–400)
RBC: 2.74 MIL/uL — AB (ref 3.87–5.11)
RDW: 16.1 % — AB (ref 11.5–15.5)
WBC: 1.6 10*3/uL — AB (ref 4.0–10.5)

## 2015-09-23 LAB — APTT: APTT: 27 s (ref 24–37)

## 2015-09-23 LAB — RENAL FUNCTION PANEL
Albumin: 2.1 g/dL — ABNORMAL LOW (ref 3.5–5.0)
Anion gap: 11 (ref 5–15)
BUN: 34 mg/dL — ABNORMAL HIGH (ref 6–20)
CHLORIDE: 104 mmol/L (ref 101–111)
CO2: 27 mmol/L (ref 22–32)
Calcium: 10.1 mg/dL (ref 8.9–10.3)
Creatinine, Ser: 0.65 mg/dL (ref 0.44–1.00)
Glucose, Bld: 153 mg/dL — ABNORMAL HIGH (ref 65–99)
POTASSIUM: 4.1 mmol/L (ref 3.5–5.1)
Phosphorus: 1.9 mg/dL — ABNORMAL LOW (ref 2.5–4.6)
Sodium: 142 mmol/L (ref 135–145)

## 2015-09-23 LAB — CULTURE, RESPIRATORY: SPECIAL REQUESTS: NORMAL

## 2015-09-23 LAB — CULTURE, RESPIRATORY W GRAM STAIN

## 2015-09-23 LAB — LACTATE DEHYDROGENASE: LDH: 351 U/L — ABNORMAL HIGH (ref 98–192)

## 2015-09-23 LAB — TRIGLYCERIDES: Triglycerides: 239 mg/dL — ABNORMAL HIGH (ref <150)

## 2015-09-23 LAB — MAGNESIUM: MAGNESIUM: 2.5 mg/dL — AB (ref 1.7–2.4)

## 2015-09-23 MED ORDER — FLUCONAZOLE IN SODIUM CHLORIDE 200-0.9 MG/100ML-% IV SOLN
200.0000 mg | INTRAVENOUS | Status: AC
  Administered 2015-09-23 – 2015-09-25 (×3): 200 mg via INTRAVENOUS
  Filled 2015-09-23 (×5): qty 100

## 2015-09-23 MED ORDER — DEXTROSE-NACL 5-0.2 % IV SOLN
INTRAVENOUS | Status: DC
  Administered 2015-09-24: 40 mL via INTRAVENOUS
  Administered 2015-09-24: 40 mL/h via INTRAVENOUS
  Filled 2015-09-23: qty 1000

## 2015-09-23 MED ORDER — HYDRALAZINE HCL 20 MG/ML IJ SOLN
10.0000 mg | Freq: Four times a day (QID) | INTRAMUSCULAR | Status: DC | PRN
  Administered 2015-09-23: 10 mg via INTRAVENOUS
  Filled 2015-09-23: qty 1

## 2015-09-23 MED ORDER — PRO-STAT SUGAR FREE PO LIQD
30.0000 mL | Freq: Two times a day (BID) | ORAL | Status: DC
  Administered 2015-09-23 – 2015-09-29 (×12): 30 mL
  Filled 2015-09-23 (×12): qty 30

## 2015-09-23 NOTE — Progress Notes (Signed)
IP PROGRESS NOTE  Subjective:   She remains on the ventilator. Her husband and other family members are at the bedside.  Objective: Vital signs in last 24 hours: Blood pressure 145/91, pulse 130, temperature 97.7 F (36.5 C), temperature source Core (Comment), resp. rate 21, height 5' 5.5" (1.664 m), weight 164 lb 0.4 oz (74.4 kg), last menstrual period 09/11/2015, SpO2 96 %.  Intake/Output from previous day: 02/06 0701 - 02/07 0700 In: 1400.3 [I.V.:156.6; NG/GT:1043.7; IV Piggyback:200] Out: 1356 [Urine:16; Stool:300]  Physical Exam:  HEENT: ET tube in place  Neurologic: Opens eyes, squeezes hands to command, minimal toe movement bilaterally  Left IJ catheter site without erythema  Lab Results:  Recent Labs  09/22/15 0400 09/23/15 0425  WBC 1.0* 1.6*  HGB 7.2* 7.5*  HCT 23.2* 24.2*  PLT 29* 18*   ANC 0.8   BMET  Recent Labs  09/22/15 1600 09/23/15 0425  NA 138 142  K 4.0 4.1  CL 103 104  CO2 27 27  GLUCOSE 141* 153*  BUN 35* 34*  CREATININE 0.67 0.65  CALCIUM 9.5 10.1    Studies/Results: Dg Abd 1 View  09/21/2015  CLINICAL DATA:  40 year old female status post enteric tube placement. Initial encounter. Metastatic carcinoma. Initial encounter. EXAM: ABDOMEN - 1 VIEW COMPARISON:  CT Abdomen and Pelvis 09/19/2015 and earlier. FINDINGS: Portable supine views of the abdomen at 1403 hours. NG type tube in place, side hole to level of the distal stomach (arrow). Partially visible left IJ approach central line at the lower mediastinum. Continued abnormal right lung base, see comparison CT Abdomen and Pelvis. Non obstructed bowel gas pattern. Oral contrast administered on 09/19/2015 is now in the colon. No definite pneumoperitoneum on this supine view. Left femoral approach venous catheter re - demonstrated, incidentally on recent CT Abdomen and Pelvis the catheter tip was within a small lumbar vein. IMPRESSION: 1. Enteric tube side hole the distal stomach. 2.  Non  obstructed bowel gas pattern. 3. Left femoral venous catheter tip within a small lumbar vein recent CT. If poor flows recommend repositioning (if the catheter could be retracted 4 cm it would terminate within the larger left external iliac vein). Electronically Signed   By: Genevie Ann M.D.   On: 09/21/2015 14:36   Mr Cervical Spine Wo Contrast  09/22/2015  CLINICAL DATA:  40 year old female with right lung mass, ovarian cancer and metastatic disease with paraneoplastic syndrome. Extremity weakness. Initial encounter. EXAM: MRI CERVICAL SPINE WITHOUT CONTRAST TECHNIQUE: Multiplanar, multisequence MR imaging of the cervical spine was performed. No intravenous contrast was administered. COMPARISON:  No comparison cervical spine exam. FINDINGS: Diffuse osseous metastatic disease throughout all visualized cervical vertebra, upper thoracic vertebra and skullbase. No loss of height of vertebra or epidural extension of tumor. Evaluation slightly limited without contrast. No focal cervical cord signal abnormality. Cervical medullary junction unremarkable. Pleural effusions noted but incompletely assessed. Adenopathy left supraclavicular region and upper mediastinum. C2-3: Minimal bulge.  Minimal narrowing ventral thecal sac. C3-4: Mild bulge. Mild narrowing ventral thecal sac. Minimal foraminal narrowing. C4-5:  Minimal right foraminal narrowing. C5-6:  Minimal right foraminal narrowing. C6-7:  Negative. C7-T1:  Facet degenerative changes greater on the right. IMPRESSION: Diffuse osseous metastatic disease throughout all visualized cervical vertebra, upper thoracic vertebra and skullbase. No loss of height of vertebra or epidural extension of tumor. Evaluation slightly limited without contrast. Pleural effusions noted but incompletely assessed. Adenopathy left supraclavicular region and upper mediastinum. Minimal to mild degenerative changes most notable C3-4 as noted  above. Electronically Signed   By: Genia Del M.D.    On: 09/22/2015 12:48   Dg Chest Port 1 View  09/23/2015  CLINICAL DATA:  Respiratory failure. EXAM: PORTABLE CHEST 1 VIEW COMPARISON:  09/22/2015.  CT 09/19/2015. FINDINGS: Endotracheal tube, left IJ line, NG tube in stable position. Heart size stable. Persistent bilateral pulmonary mass lesions are again noted with the dominant mass in the right lower lobe. Findings consistent metastatic disease. Persistent infiltrate right lower lobe. Persistent moderate right pleural effusion. No pneumothorax. No acute bony abnormality . IMPRESSION: 1. Lines and tubes stable position. 2. Persistent bilateral pulmonary mass lesions consistent metastatic disease. No interim change. Persistent low lung volumes and infiltrate right lower lobe. Persistent moderate right pleural effusion. Electronically Signed   By: Marcello Moores  Register   On: 09/23/2015 07:15   Dg Chest Port 1 View  09/22/2015  CLINICAL DATA:  Hypoxia/respiratory failure EXAM: PORTABLE CHEST 1 VIEW COMPARISON:  Chest radiograph September 18, 2015 and chest CT September 19, 2015 FINDINGS: Endotracheal tube tip is 1.9 cm above the carina. Central catheter tip is in the superior vena cava near the cavoatrial junction. Nasogastric tube tip and side port are below the diaphragm. No pneumothorax. The previously noted right pleural effusion is smaller compared to recent prior studies. There is residual right effusion with right base atelectasis. The known mass in the right base is ill-defined on this examination, likely with surrounding patchy pneumonitis. Multiple ill-defined nodular lesions are noted bilaterally consistent with metastatic foci. No new opacity is apparent compared to recent prior studies. Heart size and pulmonary vascularity are normal. No bone lesions apparent. Adenopathy noted on recent CT examination is less apparent on current portable chest radiograph. IMPRESSION: Tube and catheter positions as described without pneumothorax. Right effusion smaller compared  to recent prior studies. Extensive metastatic disease remains. The dominant mass in the right base is ill-defined and not well delineated on the current examination, in part due to surrounding apparent pneumonitis. No new opacity apparent. Electronically Signed   By: Lowella Grip III M.D.   On: 09/22/2015 07:12    Medications: I have reviewed the patient's current medications.  Assessment/Plan:  1. Metastatic carcinoma with a dominant right lung mass, bilateral lung masses, chest/abdominal lymphadenopathy, and a pelvic mass  Bronchoscopy 09/01/2015 revealed endobronchial lesions at the right upper lobe and right middle lobe, biopsies revealed malignant cells without a definitive diagnosis  CT-guided biopsy of a respiratory lymph node 09/04/2015 confirmed poorly differential carcinoma, with some features of a neuroendocrine carcinoma.  Cycle 1 etoposide/carboplatin 09/10/2015  CTs chest, abdomen, and pelvis on 09/19/2015-mild decrease in dominant right lower lobe mass, new cell adenopathy, and pulmonary masses. Slight enlargement of the pelvic mass.  MRI cervical spine 09/22/2015 with diffuse bone metastases, no epidural tumor  2. Respiratory failure secondary to #1 and pneumonia, and possibly motor weakness secondary to deconditioning or a paraneoplastic syndrome  3. Renal failure-maintained on CRRT   4. Left leg and right upper extremity DVTs 09/09/2015, on heparin  5.  Altered mental status secondary to uremia and respiratory failure-improved  6.  Anemia secondary to phlebotomy, metastatic carcinoma, and renal failure, status post a red cell transfusion 09/15/2015  7.  Leukopenia/thrombocytopenia secondary to chemotherapy, G-CSF started 09/20/2015  8.  Extremity weakness-most likely related to deconditioning/critical illness   Ms. Smither is now at day 14 following a first cycle of etoposide/carboplatin. The neutrophil count is slightly better today. She has severe  thrombocytopenia. Heparin was discontinued yesterday. The plan  is to provide platelet transfusion support for bleeding or a count of less than 10,000.  I discussed the case with the critical care service this morning. The prognosis for recovery remains very poor. I will check the LDH and a liver panel today.  Recommendations: 1. Continue management of respiratory failure per critical care medicine 2. CRRT per nephrology 3. Daily CBC/differential, continue G-CSF 4. Transfuse platelets for a count of less than 10,000 or bleeding  LOS: 24 days   Edgar Reisz  09/23/2015, 9:52 AM

## 2015-09-23 NOTE — Progress Notes (Signed)
PCCM PROGRESS NOTE  ADMISSION DATE: 09/04/2015 CONSULT DATE: 08/30/2015 REFERRING PROVIDER: Dr. Roel Cluck, [facility]  CC: 40 yo female with progressive dyspnea, wheezing, adm 1/13. 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 S/p chemo -carbo + etoposide Failed extubation x 1 Now with diffuse weakness  SUBJECTIVE:  No obvious pain Somnolent Follows commands Afebrile On CRRT  VITAL SIGNS: BP 145/91 mmHg  Pulse 130  Temp(Src) 97.7 F (36.5 C) (Core (Comment))  Resp 21  Ht 5' 5.5" (1.664 m)  Wt 164 lb 0.4 oz (74.4 kg)  BMI 26.87 kg/m2  SpO2 96%  LMP 08/19/2015  INTAKE/OUTPUT: I/O last 3 completed shifts: In: 2532.8 [I.V.:649.2; NG/GT:1583.7; IV [id number]:638] Out: 2429 [Urine:22; Other:1907; Stool:500]  General: awake HENT: NCAT ETT in place PULM: Rhonchi bilaterally, wheezing, vent supported breaths CV: Tachy, regular GI: BS+, soft, nontender Derm: stage 2 with mild surrounding brown changes sacral area about 4-5 cm total Neuro: Awake on vent, nods head, follows commands, weak grip strength,   CBC Recent Labs     09/21/15  0440  09/22/15  0400  09/23/15  0425  WBC  1.1*  1.0*  1.6*  HGB  7.5*  7.2*  7.5*  HCT  24.2*  23.2*  24.2*  PLT  47*  29*  18*    Coag's Recent Labs     09/21/15  0440  09/22/15  0400  09/23/15  0425  APTT  79*  98*  27    BMET Recent Labs     09/22/15  0400  09/22/15  1600  09/23/15  0425  NA  141  138  142  K  4.1  4.0  4.1  CL  104  103  104  CO2  '24  27  27  '$ BUN  32*  35*  34*  CREATININE  0.59  0.67  0.65  GLUCOSE  149*  141*  153*    Electrolytes Recent Labs     09/21/15  0440   09/22/15  0400  09/22/15  1600  09/23/15  0425  CALCIUM  9.1   < >  9.7  9.5  10.1  MG  2.5*   --   2.5*   --   2.5*  PHOS  2.4*   < >  2.2*  2.4*  1.9*   < > = values in this interval not displayed.    Sepsis Markers No  results for input(s): PROCALCITON, O2SATVEN in the last 72 hours.  Invalid input(s): LACTICACIDVEN  ABG No results for input(s): PHART, PCO2ART, PO2ART in the last 72 hours.  Liver Enzymes Recent Labs     09/22/15  0400  09/22/15  1600  09/23/15  0425  ALBUMIN  1.9*  2.0*  2.1*    Cardiac Enzymes No results for input(s): TROPONINI, PROBNP in the last 72 hours.  Glucose Recent Labs     09/20/15  1149  09/20/15  1524  09/20/15  1948  09/21/15  0030  09/21/15  0801  09/21/15  1559  GLUCAP  128*  138*  118*  139*  130*  132*    Imaging Dg Abd 1 View  09/21/2015  CLINICAL DATA:  40 year old female status post enteric tube placement. Initial encounter. Metastatic carcinoma. Initial encounter. EXAM: ABDOMEN - 1 VIEW COMPARISON:  CT Abdomen and Pelvis 09/19/2015 and earlier. FINDINGS: Portable supine views of the abdomen at 1403 hours. NG type tube  in place, side hole to level of the distal stomach (arrow). Partially visible left IJ approach central line at the lower mediastinum. Continued abnormal right lung base, see comparison CT Abdomen and Pelvis. Non obstructed bowel gas pattern. Oral contrast administered on 09/19/2015 is now in the colon. No definite pneumoperitoneum on this supine view. Left femoral approach venous catheter re - demonstrated, incidentally on recent CT Abdomen and Pelvis the catheter tip was within a small lumbar vein. IMPRESSION: 1. Enteric tube side hole the distal stomach. 2.  Non obstructed bowel gas pattern. 3. Left femoral venous catheter tip within a small lumbar vein recent CT. If poor flows recommend repositioning (if the catheter could be retracted 4 cm it would terminate within the larger left external iliac vein). Electronically Signed   By: Genevie Ann M.D.   On: 09/21/2015 14:36   Mr Cervical Spine Wo Contrast  09/22/2015  CLINICAL DATA:  40 year old female with right lung mass, ovarian cancer and metastatic disease with paraneoplastic syndrome. Extremity  weakness. Initial encounter. EXAM: MRI CERVICAL SPINE WITHOUT CONTRAST TECHNIQUE: Multiplanar, multisequence MR imaging of the cervical spine was performed. No intravenous contrast was administered. COMPARISON:  No comparison cervical spine exam. FINDINGS: Diffuse osseous metastatic disease throughout all visualized cervical vertebra, upper thoracic vertebra and skullbase. No loss of height of vertebra or epidural extension of tumor. Evaluation slightly limited without contrast. No focal cervical cord signal abnormality. Cervical medullary junction unremarkable. Pleural effusions noted but incompletely assessed. Adenopathy left supraclavicular region and upper mediastinum. C2-3: Minimal bulge.  Minimal narrowing ventral thecal sac. C3-4: Mild bulge. Mild narrowing ventral thecal sac. Minimal foraminal narrowing. C4-5:  Minimal right foraminal narrowing. C5-6:  Minimal right foraminal narrowing. C6-7:  Negative. C7-T1:  Facet degenerative changes greater on the right. IMPRESSION: Diffuse osseous metastatic disease throughout all visualized cervical vertebra, upper thoracic vertebra and skullbase. No loss of height of vertebra or epidural extension of tumor. Evaluation slightly limited without contrast. Pleural effusions noted but incompletely assessed. Adenopathy left supraclavicular region and upper mediastinum. Minimal to mild degenerative changes most notable C3-4 as noted above. Electronically Signed   By: Genia Del M.D.   On: 09/22/2015 12:48   Dg Chest Port 1 View  09/23/2015  CLINICAL DATA:  Respiratory failure. EXAM: PORTABLE CHEST 1 VIEW COMPARISON:  09/22/2015.  CT 09/19/2015. FINDINGS: Endotracheal tube, left IJ line, NG tube in stable position. Heart size stable. Persistent bilateral pulmonary mass lesions are again noted with the dominant mass in the right lower lobe. Findings consistent metastatic disease. Persistent infiltrate right lower lobe. Persistent moderate right pleural effusion. No  pneumothorax. No acute bony abnormality . IMPRESSION: 1. Lines and tubes stable position. 2. Persistent bilateral pulmonary mass lesions consistent metastatic disease. No interim change. Persistent low lung volumes and infiltrate right lower lobe. Persistent moderate right pleural effusion. Electronically Signed   By: Marcello Moores  Register   On: 09/23/2015 07:15   Dg Chest Port 1 View  09/22/2015  CLINICAL DATA:  Hypoxia/respiratory failure EXAM: PORTABLE CHEST 1 VIEW COMPARISON:  Chest radiograph September 18, 2015 and chest CT September 19, 2015 FINDINGS: Endotracheal tube tip is 1.9 cm above the carina. Central catheter tip is in the superior vena cava near the cavoatrial junction. Nasogastric tube tip and side port are below the diaphragm. No pneumothorax. The previously noted right pleural effusion is smaller compared to recent prior studies. There is residual right effusion with right base atelectasis. The known mass in the right base is ill-defined on this examination,  likely with surrounding patchy pneumonitis. Multiple ill-defined nodular lesions are noted bilaterally consistent with metastatic foci. No new opacity is apparent compared to recent prior studies. Heart size and pulmonary vascularity are normal. No bone lesions apparent. Adenopathy noted on recent CT examination is less apparent on current portable chest radiograph. IMPRESSION: Tube and catheter positions as described without pneumothorax. Right effusion smaller compared to recent prior studies. Extensive metastatic disease remains. The dominant mass in the right base is ill-defined and not well delineated on the current examination, in part due to surrounding apparent pneumonitis. No new opacity apparent. Electronically Signed   By: Lowella Grip III M.D.   On: 09/22/2015 07:12    CULTURES: 1/13 Blood >> neg 1/14 Pneumococcal Ag >> negative 1/14 Legionella Ag >> negative 1/15 C diff PCR >> negative 1/22 BAL>>>yeast candida only 5 k BCX2  1/21>>>neg  2/4 resp culture > GPC pairs >>candida 2/4 blood culture > ng 2/4 urine culture > yeast >>  ANTIBIOTICS: 1/13 Rocephin >> 1/13 1/13 Zithromax >> 1/13 1/14 Vancomycin >> 1/19, restart 1/21>>>off 1/14 Zosyn >> 1/20. 1/21 Ceftaz >>>off 2/3 unasyn >>>2/4  2/4 vanc > 2/6 2/4 ceftaz >   LINES/TUBES: 1/15 Rt PICC >> 1/24 Left IJ CVL 1/24>>> HD cath 1/27>>>  STUDIES: 1/13 CT chest >> multiple b/l nodules, mass like consolidation Rt mid lung, Rt hilar and subcarinal mass, Lt hilar LAN, 2.7 cm Rt paratracheal LAN 1/14 CT abd/pelvis >> 10 cm mass superior to uterus 1/15 Tumor markers >> CA 19-9 453, CEA 3.9, CA 125 780.8 1/16 Bronchoscopy >> crush artifact ?small cell lung cancer (non diagnostic) 1/24 lower ext doppler > left DVT peroneal vein 2/4 CT chest abdomen pelvis> Right lung mass smaller, surrounding consolidation noted, left lung nodules smaller, pelvic mass smaller   EVENTS: 1/13 Admit 1/15 Gyn oncology consulted 1/16 Bronchoscopy in ICU; Placed on BiPAP for wheezing, dyspnea 1/19 IR biopsy>>>Poorly differentiated high Grade Carcinoma.  >stains favor Neuroendocrine. TTF stain was NEGATIVE 1/22 bronch cytology: neg  1/25 Etoposide/carboplatin cycle #1 1/28 EEG>>> diffuse slowly (performed while uremic) 1/31 weaning  2/1: gave fluid challenge and changed to even volume status.  2/2 passed SBT. Extubated. Re-intubated 2/3 again approached family re: goals of care. They want full support 2/5 neurology consult >   ASSESSMENT/PLAN:  PULMONARY A: Acute hypoxic respiratory failure due to lung cancer and profound neuromuscular weakness HCAP on 2/4 CT chest > clear left lung consolidation in lingula and left lower lobe  P: Pressure support as tolerated -cannot extubate due to diffuse weakness There is no role for tracheostomy here as she has incurable cancer with multi-organ failure  NEUROLOGY A: Cancer pain > minimal Acute Encephalopathy> better after HD  but worse again when hypercarbic.  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 vs less likely auto-immune mediated from malignancy (Eaton-Lambert, myasthenia, etc) P: Await results of auto-antibodies sent by Neurology & their input Minimize sedation per family request  Morphine for pain Start PT   CARDIAC A: Persistent  sinus tachycardia P: Tele Keep volume status net even  RENAL A: AKI> oliguric P: Monitor BMET and UOP Replace electrolytes as needed Can give break on CRRT in my opinion & transition to iHD   GASTROENTEROLOGY A: Nutrition, cdiff neg, loose stools, abdo distention Pancreatic inflammation on CT but lipase normal P:  tube feeds @ goal Protonix for SUP Imodium  HEMATOLOGY/ONCOLOGY A: 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  They remain hopeful despite her dismal prognosis.  See multiple previous notes from [facility].   Summary - Her profound neuromuscular weakness is her primary reason for her respiratory failure.  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 Prognosis very poor. Will get palliative care input    The patient is critically ill with multiple organ systems failure and requires high complexity decision making for assessment and support, frequent evaluation and titration of therapies,  application of advanced monitoring technologies and extensive interpretation of multiple databases. Critical Care Time devoted to patient care services described in this note independent of APP time is 35 minutes.   [place] [phone]

## 2015-09-23 NOTE — Consult Note (Signed)
Consultation Note Date: 09/23/2015   Patient Name: Monique Monique [name]  DOB: 11-03-1975  MRN: [id number]  Age / Sex: 40 y.o., female  PCP: No primary care provider on file. Referring Physician: Marshell Garfinkel, MD  Reason for Consultation: Establishing goals of care    Clinical Assessment/Narrative: I met today with Monique Monique [name], husband, mother-in-law, brother and father at bedside. Very unfortunate situation and family struggling with the abrupt changes and all the "bad news" they have been receiving here in ICU. Husband seems to have understand all the aspects and barriers to her improvement but still very much hopeful for some improvement. We discussed steps from dialysis standpoint and transition off CRRT. They do not want to transfer from [facility] to [facility] unless absolutely necessary - they feel like they have built relationship with nursing staff here and are comfortable with them. They are also very overwhelmed by all the information given to them by various providers.   Husband makes a few vague remarks indicating/alluding to poor prognosis today. Family agrees she is between a rock and a hard place with limited options. He says what he really wants is for her to be extubated so that she can talk to her 67 yo son, Monique Monique [name]. Discussed barriers to extubation and assured family that all avenues of any reversible issues for Monique Monique [name] are being explored and her husband acknowledges that other issues that are reversible is unlikely and I agree. They plan to provide ROM to her and we discussed her debilitating weakness effecting a successful extubation. They also would like to avoid sedating medications as much as possible but understand that she will need these at times. Discussed non-medical therapies to comfort and neck Monique [name].   They say that Monique Monique [name] comes to visit her everyday and has been handling these changes fairly well as they  have kept him distracted as well. They have an appointment at [facility] today for [place] today.   Today my focus was to introduce palliative care as a support to family and begin to build rapport. I will continue to follow daily.    Contacts/Participants in Discussion: Primary Decision Maker: Husband    SUMMARY OF RECOMMENDATIONS - Focus today on building rapport and relationship with patient/family to continue conversations - Their goal now is to focus on how to get her extubated which poses a challenge - we will continue discussing this  Code Status/Advance Care Planning: Full code    Code Status Orders        Start     Ordered   09/18/15 1818  Full code   Continuous     09/18/15 1817    Code Status History    Date Active Date Inactive Code Status Order ID Comments User Context   09/18/2015  4:14 PM 09/18/2015  6:17 PM Partial Code [id number]  Monique Doom, MD Inpatient   09/10/2015  3:40 PM 09/18/2015  4:14 PM DNR [id number]  Monique Colace, NP Inpatient   09/09/2015  9:04 PM 09/10/2015  3:40 PM Full Code [id number]  Monique Farmer, MD Inpatient   09/07/2015  4:12 PM 09/09/2015  9:04 PM Partial Code [id number]  Monique Garfinkel, MD Inpatient   08/30/2015  2:58 PM 09/07/2015  4:12 PM Full Code [id number]  Monique Baker, MD Inpatient       Symptom Management:   Monique [name]: I would favor fentanyl or dilaudid over morphine with renal failure. Especially if considering trial off CRRT.   Palliative Prophylaxis:   Frequent Monique [name]  Assessment and Oral Care  Additional Recommendations (Limitations, Scope, Preferences):  Full Scope Treatment  Psycho-social/Spiritual:  Support System: Strong Desire for further Chaplaincy support:yes Additional Recommendations: Caregiving  Support/Resources, Education on Hospice, Grief/Bereavement Support and [facility] Referral  Prognosis: Extremely poor with multiple complex issues and high risk for acute decompensation at any time.   Discharge Planning:  To be determined. Remain inpt.    Chief Complaint/ Primary Diagnoses: Present on Admission:  . Lung mass . Acute respiratory failure with hypoxia ([place]) . Postobstructive pneumonia . Leukocytosis . Sepsis due to pneumonia ([place]) . Carcinoma of unknown primary ([place])  I have reviewed the medical record, interviewed the patient and family, and examined the patient. The following aspects are pertinent.  History reviewed. No pertinent past medical history. Social History   Social History  . Marital Status: Married    Spouse Name: N/A  . Number of Children: N/A  . Years of Education: N/A   Social History Main Topics  . Smoking status: Never Smoker   . Smokeless tobacco: None  . Alcohol Use: Yes     Comment: rare  . Drug Use: No  . Sexual Activity: Not Asked   Other Topics Concern  . None   Social History Narrative   Family History  Problem Relation Age of Onset  . Lymphoma Mother   . CAD Mother    Scheduled Meds: . antiseptic oral rinse  7 mL Mouth Rinse QID  . budesonide (PULMICORT) nebulizer solution  0.5 mg Nebulization BID  . cefTAZidime (FORTAZ)  IV  1 g Intravenous Q8H  . chlorhexidine  15 mL Mouth Rinse BID  . feeding supplement (NEPRO CARB STEADY)  1,000 mL Oral Q24H  . feeding supplement (PRO-STAT SUGAR FREE 64)  30 mL Per Tube BID  . filgrastim  480 mcg Subcutaneous q1800  . fluconazole (DIFLUCAN) IV  200 mg Intravenous Q24H  . levalbuterol  0.63 mg Nebulization Q6H  . pantoprazole sodium  40 mg Per Tube Daily  . sodium chloride  10-40 mL Intracatheter Q12H   Continuous Infusions: . dialysis replacement fluid (prismasate) 400 mL/hr at 09/23/15 0451  . dialysis replacement fluid (prismasate) 200 mL/hr at 09/22/15 1610  . dialysate (PRISMASATE) 2,000 mL/hr at 09/23/15 1207   PRN Meds:.alteplase, alteplase, artificial tears, fentaNYL (SUBLIMAZE) injection, fentaNYL (SUBLIMAZE) injection, heparin, heparin lock flush, heparin lock flush, hydrALAZINE,  levalbuterol, loperamide, metoprolol, morphine injection, [DISCONTINUED] ondansetron **OR** ondansetron (ZOFRAN) IV, polyvinyl alcohol, simethicone, sodium chloride, sodium chloride, sodium chloride flush, sodium chloride flush Medications Prior to Admission:  Prior to Admission medications   Medication Sig Start Date End Date Taking? Authorizing Provider  albuterol (PROVENTIL HFA;VENTOLIN HFA) 108 (90 Base) MCG/ACT inhaler Inhale 2 puffs into the lungs every 4 (four) hours as needed for wheezing or shortness of breath.   Yes Historical Provider, MD  Ascorbic Acid (VITAMIN C PO) Take 1 tablet by mouth daily.   Yes Historical Provider, MD  budesonide-formoterol (SYMBICORT) 160-4.5 MCG/ACT inhaler Inhale 2 puffs into the lungs 2 (two) times daily.   Yes Historical Provider, MD  Cholecalciferol (VITAMIN D PO) Take 1 tablet by mouth daily.   Yes Historical Provider, MD  clarithromycin (BIAXIN) 500 MG tablet Take 500 mg by mouth 2 (two) times daily. For 10 days 08/26/15-09/05/15 08/26/15  Yes Historical Provider, MD  levofloxacin (LEVAQUIN) 500 MG tablet Take 500 mg by mouth daily. Reported on 08/25/2015 08/18/15   Historical Provider, MD   No Known Allergies  Review of Systems  Unable to perform  ROS   Physical Exam  Constitutional: She appears well-developed and well-nourished. She is intubated.  HENT:  Head: Normocephalic and atraumatic.  Cardiovascular: Tachycardia present.   Respiratory: No accessory muscle usage. She is intubated. No respiratory distress.  GI: Normal appearance.  Neurological: She is alert.  Nods head "yes/no" appropriately most of the time    Vital Signs: BP 143/103 mmHg  Pulse 116  Temp(Src) 97.3 F (36.3 C) (Core (Comment))  Resp 21  Ht 5' 5.5" (1.664 m)  Wt 74.4 kg (164 lb 0.4 oz)  BMI 26.87 kg/m2  SpO2 94%  LMP 09/12/2015  SpO2: SpO2: 94 % O2 Device:SpO2: 94 % O2 Flow Rate: .O2 Flow Rate (L/min): 40 L/min  IO: Intake/output summary:  Intake/Output Summary  (Last 24 hours) at 09/23/15 1343 Last data filed at 09/23/15 1300  Gross per 24 hour  Intake 1383.33 ml  Output   1284 ml  Net  99.33 ml    LBM: Last BM Date: 09/22/15 Baseline Weight: Weight: 82.555 kg (182 lb) Most recent weight: Weight: 74.4 kg (164 lb 0.4 oz)      Palliative Assessment/Data:    Additional Data Reviewed:  CBC:    Component Value Date/Time   WBC 1.6* 09/23/2015 0425   HGB 7.5* 09/23/2015 0425   HCT 24.2* 09/23/2015 0425   PLT 18* 09/23/2015 0425   MCV 88.3 09/23/2015 0425   NEUTROABS 0.8* 09/23/2015 0425   LYMPHSABS 0.7 09/23/2015 0425   MONOABS 0.1 09/23/2015 0425   EOSABS 0.0 09/23/2015 0425   BASOSABS 0.0 09/23/2015 0425   Comprehensive Metabolic Panel:    Component Value Date/Time   NA 142 09/23/2015 0425   K 4.1 09/23/2015 0425   CL 104 09/23/2015 0425   CO2 27 09/23/2015 0425   BUN 34* 09/23/2015 0425   CREATININE 0.65 09/23/2015 0425   GLUCOSE 153* 09/23/2015 0425   CALCIUM 10.1 09/23/2015 0425   AST 50* 09/23/2015 1145   ALT 74* 09/23/2015 1145   ALKPHOS 295* 09/23/2015 1145   BILITOT 0.8 09/23/2015 1145   PROT 6.4* 09/23/2015 1145   ALBUMIN 2.1* 09/23/2015 1145     Time In: 0845 Time Out: 0945 Time Total: 66mn Greater than 50%  of this time was spent counseling and coordinating care related to the above assessment and plan.  Signed by: PPershing Proud NP  APershing Proud NP  27/01/3942 1:43 PM  Please contact Palliative Medicine Team phone at [phone] questions and concerns.

## 2015-09-23 NOTE — Progress Notes (Addendum)
  [facility] Progress Note   Subjective: no new issues , was on PS for awhile, going back to vent support  Filed Vitals:   09/23/15 1345 09/23/15 1400 09/23/15 1435 09/23/15 1500  BP: 163/104 163/96  134/81  Pulse:  112 122 127  Temp:  97.5 F (36.4 C)  97.7 F (36.5 C)  TempSrc:      Resp:  '22 30 22  '$ Height:      Weight:      SpO2:  94% 93% 91%    Inpatient medications: . antiseptic oral rinse  7 mL Mouth Rinse QID  . budesonide (PULMICORT) nebulizer solution  0.5 mg Nebulization BID  . cefTAZidime (FORTAZ)  IV  1 g Intravenous Q8H  . chlorhexidine  15 mL Mouth Rinse BID  . feeding supplement (NEPRO CARB STEADY)  1,000 mL Oral Q24H  . feeding supplement (PRO-STAT SUGAR FREE 64)  30 mL Per Tube BID  . filgrastim  480 mcg Subcutaneous q1800  . fluconazole (DIFLUCAN) IV  200 mg Intravenous Q24H  . levalbuterol  0.63 mg Nebulization Q6H  . pantoprazole sodium  40 mg Per Tube Daily  . sodium chloride  10-40 mL Intracatheter Q12H     alteplase, artificial tears, fentaNYL (SUBLIMAZE) injection, fentaNYL (SUBLIMAZE) injection, heparin, heparin lock flush, heparin lock flush, hydrALAZINE, levalbuterol, loperamide, metoprolol, morphine injection, [DISCONTINUED] ondansetron **OR** ondansetron (ZOFRAN) IV, polyvinyl alcohol, simethicone, sodium chloride, sodium chloride flush, sodium chloride flush  Exam: More alert, opens eyes, nods head in response to voice No jvd Chest coarse BS bilat RRR no mrg Abd soft mod distended no mass GU foley in place Trace LE edema/ UE edema bilat Left femoral cath placed 1/27 Neuro as above     CXR 2/6 RLL opacity and lung masses w no change, no CHF  Assessment: 1 Acute renal failure - oliguric, prob ATN.  Not making urine. Doesn't need CRRT anymore, will plan transition to intermittent HD.  2 Volume - CVP ok, wts are down, having diarrhea and may be dry 3 Anemia transfusing prn 4 Metastatic cancer, neuroendocrine type- s/p chemo  x 1 on 1/25 w carbo/etoposide 5 Resp failure remains on vent 6 Hypophos repleting prn 7 Leukopenia s/p chemo 8 ID/ HCAP - on vanc/ fortaz IV  Plan - dc CRRT, plan regular HD when necessary.  Replace GI losses with 1/4 NS + additional 40 cc/hr IVF.      Kelly Splinter MD [facility] pager [phone]    cell [phone] 09/23/2015, 3:45 PM    Recent Labs Lab 09/22/15 0400 09/22/15 1600 09/23/15 0425  NA 141 138 142  K 4.1 4.0 4.1  CL 104 103 104  CO2 '24 27 27  '$ GLUCOSE 149* 141* 153*  BUN 32* 35* 34*  CREATININE 0.59 0.67 0.65  CALCIUM 9.7 9.5 10.1  PHOS 2.2* 2.4* 1.9*    Recent Labs Lab 09/22/15 1600 09/23/15 0425 09/23/15 1145  AST  --   --  50*  ALT  --   --  74*  ALKPHOS  --   --  295*  BILITOT  --   --  0.8  PROT  --   --  6.4*  ALBUMIN 2.0* 2.1* 2.1*    Recent Labs Lab 09/21/15 0440 09/22/15 0400 09/23/15 0425  WBC 1.1* 1.0* 1.6*  NEUTROABS 0.7* 0.6* 0.8*  HGB 7.5* 7.2* 7.5*  HCT 24.2* 23.2* 24.2*  MCV 88.0 88.9 88.3  PLT 47* 29* 18*

## 2015-09-23 NOTE — Progress Notes (Signed)
OT Cancellation Note  Patient Details Name: Monique [name] MRN: [id number] DOB: [birth year]   Cancelled Treatment:    Reason Eval/Treat Not Completed: Other (comment).  Received OT order.  Noted pt with ETT and on CRRT with femoral dialysis catheter.  PT spoke to RN and family has been educated on ROM.  PT plans to check back tomorrow, and OT will follow up to see if pt would benefit from our services.  Margarett Viti 09/23/2015, 3:39 PM  Lesle Chris, OTR/L [phone] 09/23/2015

## 2015-09-23 NOTE — Progress Notes (Signed)
Nutrition Follow-up  INTERVENTION:   Continue Nepro at goal rate of 51m/hr. Decrease 30 ml Prostat to BID TF regimen provides 1928 kcal (104% of needs), 107g protein and 698 ml H20.  RD to continue to monitor  NUTRITION DIAGNOSIS:   Inadequate oral intake related to inability to eat as evidenced by NPO status.  Ongoing.  GOAL:   Patient will meet greater than or equal to 90% of their needs  Meeting.  MONITOR:   TF tolerance, Skin, Vent status, Labs, I & O's , GOC  ASSESSMENT:   Presented with about the month worsens worsening shortness of breath and wheezing. She has been seen for this by her primary care provider diagnosed with pneumonia based on chest x-ray and completed antibiotics is not improvement repeat chest x-ray showed persistent pneumonia she had a total three courses of antibiotics Including z-pack, levaquin and clarithromycin. Suspect that she has a malignancy of sort, however, it is unclear what the primary is;  Multi-compartmental masses (chest, adenopathy, ovarian).   Patient continues CRRT and on vent. Receiving Nepro '@40'$  ml/hr and 30 ml Prostat TID, tolerating. Pt with poor prognosis and not a candidate for trach per pulmonology note. Pt's weight is -13 lb since admission. Nutrition needs re-estimated given weight status change and vent settings.  Patient is currently intubated on ventilator support since 1/19. Pt failed extubation 2/2. MV: 9.3 L/min Temp (24hrs), Avg:97.3 F (36.3 C), Min:96.8 F (36 C), Max:97.7 F (36.5 C)  Labs reviewed: Mg/Phos WNL  Diet Order:  Diet NPO time specified  Skin:  Wound (see comment) (Stage II buttocks ulcer)  Last BM:  2/6  Height:   Ht Readings from Last 1 Encounters:  09/13/15 5' 5.5" (1.664 m)    Weight:   Wt Readings from Last 1 Encounters:  09/23/15 164 lb 0.4 oz (74.4 kg)    Ideal Body Weight:  57.95 kg (kg)  BMI:  Body mass index is 26.87 kg/(m^2).  Estimated Nutritional Needs:   Kcal:   1860  Protein:  110-120g  Fluid:  1.8L/day  EDUCATION NEEDS:   No education needs identified at this time  LClayton Bibles MS, RD, LDN Pager: [phone] Hours Pager: [phone]

## 2015-09-23 NOTE — Progress Notes (Signed)
   09/23/15 1200  Clinical Encounter Type  Visited With Patient and family together  Visit Type Follow-up;Psychological support;Spiritual support;Critical Care  Spiritual Encounters  Spiritual Needs Emotional;Other (Comment) (Pastoral Conversation/Support)  Stress Factors  Patient Stress Factors Health changes;Other (Comment) (Being Tranferred to [facility])  Family Stress Factors Health changes;Exhausted;Lack of knowledge;Other (Comment)   I followed up with the patient and her family. The family were all present at her bedside and were talking to the patient's mother via [place]. The patient's husband stated that he was concerned about the possibility that the patient would be transferred to [facility]. He had been told by people, who he didn't identify, that the staff at [facility] would not let him stay with the patient like he has been doing here at [facility]. He stated that she told him that she wanted him to be with her, which she nodded to indicate "yes."  When I asked that patient if she wanted to go to [facility] she shook her head to indicate "no."  The patient was able to communicate better today and when asked if she agreed with the medical procedures and plan for her care she nodded to indicate "yes." Today was the first day that she has been able to tell me what she wanted.  The patient mouthed "thank you" to me at one point in response to a statement that I had made.  Today, I was able to see that the patient is trying to hang on; whereas, it seemed previously that it was the husband who was the one hanging on. He feels that he is fulfilling her wishes and today she indicated that she agrees with the plan for care  The patient's husband still doesn't acknowledge the severity of the patient's condition; but was visibly exhausted today. When I asked him how things were going he motioned to me that it was a roller coaster. Sometimes, he feels, things are going good and other times they are going bad.  He has an  appointment with the couple's 63 year old son today with [facility] in [place].  Follow-up is needed. We will continue to provide support in whatever way that we can.   [place] M.Div.

## 2015-09-23 NOTE — Progress Notes (Signed)
Pharmacy Antibiotic Note  Monique [name] is a 40 y.o. female admitted on 09/06/2015. Pharmacy has been consulted for vancomycin and ceftazidime dosing for HCAP.  Vancomycin stopped on 2/5.  Remains on ceftaz.  2/7 CXR shows persistent RLL infiltrate.  Urine culture and trach aspirate growing moderate yeast and RN notes white colored tongue.  CCM thinks pt likely colonized but will treat for oral candidiasis x 3 days.   Plan:  Continue Ceftazidime 1g IV q8h  Fluconazole '200mg'$  IV x 3 doses per CCM (CVVHDF dosing)   Monitor CVVH tolerance Follow up cultures and sensitivities, de-escalate as approriate  Height: 5' 5.5" (166.4 cm) Weight: 164 lb 0.4 oz (74.4 kg) IBW/kg (Calculated) : 58.15  Temp (24hrs), Avg:97.3 F (36.3 C), Min:96.8 F (36 C), Max:97.7 F (36.5 C)   Recent Labs Lab 09/19/15 0450  09/20/15 0400  09/21/15 0440 09/21/15 1600 09/22/15 0400 09/22/15 1600 09/23/15 0425  WBC 1.3*  --  0.9*  --  1.1*  --  1.0*  --  1.6*  CREATININE 0.71  < > 0.76  < > 0.64 0.64 0.59 0.67 0.65  < > = values in this interval not displayed.  Estimated Creatinine Clearance: 95.5 mL/min (by C-G formula based on Cr of 0.65).    No Known Allergies  Antimicrobials this admission: 1/13 >> Rocephin >> 1/13 1/13 >> Zithromax >> 1/13 1/14 >> Vanc >> 1/19, 1/21 >> Vanc >> 1/25 1/14 >> Zosyn >> 1/20 1/21 >> Fortaz >> 1/31 1/28 >> Diflucan >> 1/31 2/2 >>Unasyn >> 2/4 2/4 >> Vanc >> 2/5 2/4 >> Ceftaz >> 2/7 >> Fluconazole x 3 (2/9)  Dose adjustments this admission: None while on CVVH  Microbiology results: 1/14 HIV screen: neg 1/14 S. pneumo UAg: neg 1/14 Legionella UAg: neg 1/14 MRSA PCR: neg 1/15 Cdiff PCR: neg 1/13 blood x2: NGF 1/21 Trach asp: moderate yeast c/w candida, final(insignificant per CCM) 1/21 blood x2: NGF 1/22 bronch washings: 5K colonies/ml, Candida, final(insignificant per CCM) 2/4 BCx: NGTD 2/4 UCx: >100K yeast 2/4 Trach asp: (gram stain: rare GPC in  pairs) Final results: Moderate yeast consistent with candida  Thank you for allowing pharmacy to be a part of this patient's care.  Ralene Bathe, PharmD, BCPS 09/23/2015, 8:25 AM  Pager: [phone]

## 2015-09-23 NOTE — Progress Notes (Signed)
Pt's husband, and two co-workers/friends (also teachers) were present when I arrived. Pt's husband said pt has been able to breathe some on her own since my last visit. He said 10 hrs one day, 8 hrs another and he said only 5 today saying the pt was tired today. He and a visitor were massaging pt's feet. I asked pt if that felt pretty good and she nodded yes. Husband said he had visited [facility] today and is looking forward to his appt w/his son there in 2 wks. He told me about results from a brain scan of his wife and was encouraged. Please page if additional support is needed. Chaplain Ernest Haber, M.Div.   09/23/15 1200  Clinical Encounter Type  Visited With Patient and family together

## 2015-09-23 NOTE — Progress Notes (Signed)
PT Cancellation Note  Patient Details Name: Monique [name] MRN: [id number] DOB: 02-07-1976   Cancelled Treatment:    Reason Eval/Treat Not Completed: Other (comment) (chart reviewed, note Palliative care consult. Spoke to RN about patient current status. will check back in AM.. Currently ROM is only goal. While receiving CRRT with femoral line.)   Yulitza, [place] 09/23/2015, 2:28 PM Tresa Endo PT [phone]

## 2015-09-24 ENCOUNTER — Inpatient Hospital Stay (HOSPITAL_COMMUNITY): Payer: BC Managed Care – PPO

## 2015-09-24 LAB — RENAL FUNCTION PANEL
ALBUMIN: 2 g/dL — AB (ref 3.5–5.0)
Anion gap: 12 (ref 5–15)
BUN: 82 mg/dL — AB (ref 6–20)
CALCIUM: 11.9 mg/dL — AB (ref 8.9–10.3)
CO2: 23 mmol/L (ref 22–32)
CREATININE: 1.51 mg/dL — AB (ref 0.44–1.00)
Chloride: 104 mmol/L (ref 101–111)
GFR, EST AFRICAN AMERICAN: 49 mL/min — AB (ref >60)
GFR, EST NON AFRICAN AMERICAN: 42 mL/min — AB (ref >60)
Glucose, Bld: 134 mg/dL — ABNORMAL HIGH (ref 65–99)
PHOSPHORUS: 3.9 mg/dL (ref 2.5–4.6)
Potassium: 4.6 mmol/L (ref 3.5–5.1)
Sodium: 139 mmol/L (ref 135–145)

## 2015-09-24 LAB — CBC WITH DIFFERENTIAL/PLATELET
BASOS ABS: 0.1 10*3/uL (ref 0.0–0.1)
Basophils Relative: 3 %
EOS PCT: 0 %
Eosinophils Absolute: 0 10*3/uL (ref 0.0–0.7)
HCT: 23.4 % — ABNORMAL LOW (ref 36.0–46.0)
Hemoglobin: 7.4 g/dL — ABNORMAL LOW (ref 12.0–15.0)
LYMPHS ABS: 1.4 10*3/uL (ref 0.7–4.0)
Lymphocytes Relative: 30 %
MCH: 27.2 pg (ref 26.0–34.0)
MCHC: 31.6 g/dL (ref 30.0–36.0)
MCV: 86 fL (ref 78.0–100.0)
MONO ABS: 1.9 10*3/uL — AB (ref 0.1–1.0)
MONOS PCT: 37 %
NEUTROS PCT: 30 %
Neutro Abs: 1.4 10*3/uL — ABNORMAL LOW (ref 1.7–7.7)
PLATELETS: 15 10*3/uL — AB (ref 150–400)
RBC: 2.72 MIL/uL — AB (ref 3.87–5.11)
RDW: 15.9 % — AB (ref 11.5–15.5)
WBC: 4.8 10*3/uL (ref 4.0–10.5)

## 2015-09-24 MED ORDER — CALCITONIN (SALMON) 200 UNIT/ML IJ SOLN
300.0000 [IU] | Freq: Two times a day (BID) | INTRAMUSCULAR | Status: DC
  Administered 2015-09-24 – 2015-09-27 (×6): 300 [IU] via INTRAMUSCULAR
  Filled 2015-09-24 (×9): qty 1.5

## 2015-09-24 MED ORDER — SODIUM CHLORIDE 0.9 % IV SOLN
60.0000 mg | Freq: Once | INTRAVENOUS | Status: AC
  Administered 2015-09-24: 60 mg via INTRAVENOUS
  Filled 2015-09-24: qty 6.67

## 2015-09-24 MED ORDER — DEXTROSE 5 % IV SOLN
2.0000 g | INTRAVENOUS | Status: AC
  Administered 2015-09-25 – 2015-09-27 (×3): 2 g via INTRAVENOUS
  Filled 2015-09-24 (×3): qty 2

## 2015-09-24 MED ORDER — VANCOMYCIN HCL IN DEXTROSE 1-5 GM/200ML-% IV SOLN
1000.0000 mg | Freq: Once | INTRAVENOUS | Status: AC
  Administered 2015-09-24: 1000 mg via INTRAVENOUS
  Filled 2015-09-24: qty 200

## 2015-09-24 NOTE — Progress Notes (Signed)
   09/24/15 1400  Clinical Encounter Type  Visited With Patient and family together  Visit Type Follow-up;Social support  Referral From Family  Spiritual Encounters  Spiritual Needs Emotional  Stress Factors  Patient Stress Factors Not reviewed  Family Stress Factors Exhausted;Loss of control;Major life changes    Counseling intern provided brief emotional support to pt's brother and father. Checked in with brother to see how he is doing and discussed emotions surrounding condition of pt. Brother identified stressors and feeling exhausted/overwhelmed. He reported that his brother-in-law (pt's husband) has been emotional over the last several days due to wife's condition. Counseling intern will follow up with pt and pt's family later this afternoon.   Duffy Rhody Counseling Intern

## 2015-09-24 NOTE — Progress Notes (Signed)
PT Cancellation Note  Patient Details Name: Monique [name] MRN: [id number] DOB: [birth year]   Cancelled Treatment:    Reason Eval/Treat Not Completed: Medical issues which prohibited therapy (noted HR 140, temp. RN has instructed family in PROM. Is not candidate for mobility at this time. Will check back tomorrow,)   Judie, [place] 09/24/2015, 12:36 PM Tresa Endo PT [phone]

## 2015-09-24 NOTE — Progress Notes (Signed)
Daily Progress Note   Patient Name: Monique [name]       Date: 09/24/2015 DOB: Sep 12, 1975  Age: 40 y.o. MRN#: [id number] Attending Physician: Marshell Garfinkel, MD Primary Care Physician: No primary care provider on file. Admit Date: 08/21/2015  Reason for Consultation/Follow-up: Establishing goals of care  Subjective: [name] has declined since I saw her yesterday. She is running high fever 101 consistently. CRRT was stopped yesterday and minimal output - maybe ~30 mL overnight. Family is at bedside including her son. I did bring her a neck pillow as family says she has been complaining of neck pain previously. Family still struggling with prognosis and condition understandably. They are focused in on the details such as her temperature being slightly lower and what we are doing for her heart rate. Offered listening and support. Husband Gerald Stabs was able to get away from the hospital to spend some time at the park with his soon which I encouraged that this is therapeutic for himself and his son with everything going on. I did not further address poor prognosis or decision today and will continue to follow and build rapport. Also did not feel like this would be appropriate in front of young son. Chaplain services are following and I believe this has been very beneficial to family - appreciative of their services for them.    Length of Stay: 25 days  Current Medications: Scheduled Meds:  . antiseptic oral rinse  7 mL Mouth Rinse QID  . budesonide (PULMICORT) nebulizer solution  0.5 mg Nebulization BID  . [START ON 09/25/2015] cefTAZidime (FORTAZ)  IV  2 g Intravenous Q24H  . chlorhexidine  15 mL Mouth Rinse BID  . feeding supplement (NEPRO CARB STEADY)  1,000 mL Oral Q24H  . feeding supplement  (PRO-STAT SUGAR FREE 64)  30 mL Per Tube BID  . filgrastim  480 mcg Subcutaneous q1800  . fluconazole (DIFLUCAN) IV  200 mg Intravenous Q24H  . levalbuterol  0.63 mg Nebulization Q6H  . pantoprazole sodium  40 mg Per Tube Daily  . sodium chloride  10-40 mL Intracatheter Q12H    Continuous Infusions: . dextrose 5 % and 0.2 % NaCl 40 mL/hr (09/24/15 1245)    PRN Meds: alteplase, artificial tears, fentaNYL (SUBLIMAZE) injection, fentaNYL (SUBLIMAZE) injection, heparin, heparin lock flush, heparin lock flush,  hydrALAZINE, levalbuterol, loperamide, metoprolol, morphine injection, [DISCONTINUED] ondansetron **OR** ondansetron (ZOFRAN) IV, polyvinyl alcohol, simethicone, sodium chloride, sodium chloride flush, sodium chloride flush  Physical Exam: Physical Exam  Constitutional: She appears well-developed and well-nourished. She appears lethargic. She is intubated.  HENT:  Head: Normocephalic.  Cardiovascular: Tachycardia present.   Pulmonary/Chest: No accessory muscle usage. Tachypnea noted. She is intubated. No respiratory distress.  Abdominal: Soft. Normal appearance.  Neurological: She appears lethargic.  Sedated on vent. Less responsive today.                 Vital Signs: BP 111/59 mmHg  Pulse 142  Temp(Src) 100.9 F (38.3 C) (Core (Comment))  Resp 26  Ht 5' 5.5" (1.664 m)  Wt 76.2 kg (167 lb 15.9 oz)  BMI 27.52 kg/m2  SpO2 95%  LMP 09/06/2015 SpO2: SpO2: 95 % O2 Device: O2 Device: Ventilator O2 Flow Rate: O2 Flow Rate (L/min): 40 L/min  Intake/output summary:  Intake/Output Summary (Last 24 hours) at 09/24/15 1732 Last data filed at 09/24/15 1600  Gross per 24 hour  Intake   1332 ml  Output    205 ml  Net   1127 ml   LBM: Last BM Date: 09/25/15 Baseline Weight: Weight: 82.555 kg (182 lb) Most recent weight: Weight: 76.2 kg (167 lb 15.9 oz)       Palliative Assessment/Data: Flowsheet Rows        Most Recent Value   Intake Tab    Referral Department  Critical  care   Unit at Time of Referral  ICU   Palliative Care Primary Diagnosis  Cancer   Date Notified  09/22/15   Palliative Care Type  New Palliative care   Reason for referral  Clarify Goals of Care   Date of Admission  09/03/2015   Date first seen by Palliative Care  09/23/15   # of days Palliative referral response time  1 Day(s)   # of days IP prior to Palliative referral  24   Clinical Assessment    Psychosocial & Spiritual Assessment    Palliative Care Outcomes       Additional Data Reviewed: CBC    Component Value Date/Time   WBC 4.8 09/24/2015 0415   RBC 2.72* 09/24/2015 0415   HGB 7.4* 09/24/2015 0415   HCT 23.4* 09/24/2015 0415   PLT 15* 09/24/2015 0415   MCV 86.0 09/24/2015 0415   MCH 27.2 09/24/2015 0415   MCHC 31.6 09/24/2015 0415   RDW 15.9* 09/24/2015 0415   LYMPHSABS 1.4 09/24/2015 0415   MONOABS 1.9* 09/24/2015 0415   EOSABS 0.0 09/24/2015 0415   BASOSABS 0.1 09/24/2015 0415    CMP     Component Value Date/Time   NA 139 09/24/2015 1000   K 4.6 09/24/2015 1000   CL 104 09/24/2015 1000   CO2 23 09/24/2015 1000   GLUCOSE 134* 09/24/2015 1000   BUN 82* 09/24/2015 1000   CREATININE 1.51* 09/24/2015 1000   CALCIUM 11.9* 09/24/2015 1000   PROT 6.4* 09/23/2015 1145   ALBUMIN 2.0* 09/24/2015 1000   AST 50* 09/23/2015 1145   ALT 74* 09/23/2015 1145   ALKPHOS 295* 09/23/2015 1145   BILITOT 0.8 09/23/2015 1145   GFRNONAA 42* 09/24/2015 1000   GFRAA 49* 09/24/2015 1000       Problem List:  Patient Active Problem List   Diagnosis Date Noted  . Palliative care encounter   . Neuromuscular weakness ([place])   . Muscular deconditioning   . Lung cancer ([place])   .  Cancer ([place])   . Pressure ulcer 09/16/2015  . Ileus ([place])   . Small cell lung cancer ([place])   . Mass   . Acute respiratory failure ([place])   . Acute respiratory failure with hypoxemia ([place])   . AKI (acute kidney injury) ([place])   . Altered mental state   . Carcinoma of unknown primary ([place])  09/10/2015  . Difficult intravenous access   . Metastatic cancer ([place])   . Adenopathy   . Hypoxia   . Uterine mass   . Acute respiratory failure with hypoxia ([place]) 08/30/2015  . Postobstructive pneumonia 08/30/2015  . Leukocytosis 08/30/2015  . Sepsis due to pneumonia ([place]) 08/30/2015  . Lung mass 09/07/2015     Palliative Care Assessment & Plan    1.Code Status:  Full code - I have not addressed this    Code Status Orders        Start     Ordered   09/18/15 1818  Full code   Continuous     09/18/15 1817    Code Status History    Date Active Date Inactive Code Status Order ID Comments User Context   09/18/2015  4:14 PM 09/18/2015  6:17 PM Partial Code [id number]  Juanito Doom, MD Inpatient   09/10/2015  3:40 PM 09/18/2015  4:14 PM DNR [id number]  Erick Colace, NP Inpatient   09/09/2015  9:04 PM 09/10/2015  3:40 PM Full Code [id number]  Rush Farmer, MD Inpatient   09/07/2015  4:12 PM 09/09/2015  9:04 PM Partial Code [id number]  Marshell Garfinkel, MD Inpatient   08/30/2015  2:58 PM 09/07/2015  4:12 PM Full Code [id number]  Toy Baker, MD Inpatient       2. Goals of Care/Additional Recommendations:  Continue full aggressive care. I have not tackled further discussion on this but I am going off what they have expressed to me. They are not allowing themselves to consider poor outcome although they do know the likely outcome. They would like for her to be extubated so she can speak with her son but not ready for extubation with more comfort measures.   Limitations on Scope of Treatment: Full Scope Treatment  Desire for further Chaplaincy support:yes  Psycho-social Needs: Caregiving  Support/Resources, Grief/Bereavement Support and [facility] Referral  3. Symptom Management:      1. Pain: Recommend fentanyl or dilaudid over morphine with renal failure.   4. Palliative Prophylaxis:   Frequent Pain Assessment, Oral Care and Turn Reposition  5. Prognosis: Extremely poor  with new metastatic cancer with multiorgan failure limiting options for improvement and reversal of critical status. High risk for acute decompensation and even death at any time during hospitalization.   6. Discharge Planning:  To be determined on outcomes.    Thank you for allowing the Palliative Medicine Team to assist in the care of this patient.   Time In: 1640 Time Out: 1730 Total Time 3mn Prolonged Time Billed  no         APershing Proud NP  21/09/8784 5:32 PM  Please contact Palliative Medicine Team phone at [phone] questions and concerns.

## 2015-09-24 NOTE — Progress Notes (Signed)
   09/24/15 1100  Clinical Encounter Type  Visited With Family  Visit Type Follow-up;Psychological support;Spiritual support;Critical Care  Spiritual Encounters  Spiritual Needs Emotional;Other (Comment) (Pastoral Conversation/Support)  Stress Factors  Patient Stress Factors Not reviewed  Family Stress Factors Exhausted;Health changes;Major life changes   I followed up with the patient's family. The patient's husband stated that the patient was not responsive today and that she had a fever due to an infection.  The family were emotionally exhausted today and stated that they weren't sure what was going to happen next with the patient's health.  The patient's husband was not impressed with [facility], but stated that he would let his son make the decision.  We will continue to follow up with this patient and her family.    [place] M.Div.

## 2015-09-24 NOTE — Progress Notes (Signed)
Epic Vent flow tab down.  RT seen Pt for 0400 VENT check, no changes made-Current Vent settings are PRVC 450, 14, Peep 5 and 50%.  Pt does not meet wean screen at this time due to O2 demands.  RT to monitor and assess as needed.

## 2015-09-24 NOTE — Progress Notes (Signed)
Pharmacy Antibiotic Note  Monique [name] is a 40 y.o. female admitted on 08/17/2015. Pharmacy has been consulted for vancomycin and ceftazidime dosing for HCAP.  Vancomycin stopped on 2/5, but patient now spiking fevers so pharmacy asked to resume Vancomycin dosing.  2/7 CXR shows persistent RLL infiltrate.  Urine culture and trach aspirate growing moderate yeast and RN notes white colored tongue.  CCM thinks pt likely colonized but will treat for oral candidiasis x 3 days.  CRRT has been stopped, however she is still anuric.  Antibiotics doses will be monitored/adjusted.    Plan:  Decrease Ceftazidime 2g IV q24h  Continue Fluconazole '200mg'$  IV x 3 doses per CCM   Vancomycin 1gm IV x1 now.  F/U random Vanc level in am.  Follow up cultures and sensitivities, de-escalate as approriate  Height: 5' 5.5" (166.4 cm) Weight: 167 lb 15.9 oz (76.2 kg) IBW/kg (Calculated) : 58.15  Temp (24hrs), Avg:100 F (37.8 C), Min:97.3 F (36.3 C), Max:101.3 F (38.5 C)   Recent Labs Lab 09/20/15 0400  09/21/15 0440 09/21/15 1600 09/22/15 0400 09/22/15 1600 09/23/15 0425 09/24/15 0415 09/24/15 1000  WBC 0.9*  --  1.1*  --  1.0*  --  1.6* 4.8  --   CREATININE 0.76  < > 0.64 0.64 0.59 0.67 0.65  --  1.51*  < > = values in this interval not displayed.  Estimated Creatinine Clearance: 51.1 mL/min (by C-G formula based on Cr of 1.51).    No Known Allergies  Antimicrobials this admission: 1/13 >> Rocephin >> 1/13 1/13 >> Zithromax >> 1/13 1/14 >> Vanc >> 1/19, 1/21 >> Vanc >> 1/25 1/14 >> Zosyn >> 1/20 1/21 >> Fortaz >> 1/31 1/28 >> Diflucan >> 1/31 2/2 >>Unasyn >> 2/4 2/4 >> Vanc >> 2/5; 2/8>> 2/4 >> Ceftaz >> 2/7 >> Fluconazole x 3 (2/9)  Dose adjustments this admission: 1/15 1900 VT: 15 on 1g IV q8h 1/18 1130 VT: 15 on 1g IV q8h 1/24 1500 VT: 36 on '750mg'$  IV q8h = hold Vanc. Decrease ceftazidime to 1g IV q12h.  1/25 0500 Vrandom: 28. Ke ~0.017, T1/2 ~41hrs. Vanc dc'd by  CCM.  Microbiology results: 1/14 HIV screen: neg 1/14 S. pneumo UAg: neg 1/14 Legionella UAg: neg 1/14 MRSA PCR: neg 1/15 Cdiff PCR: neg 1/13 blood x2: NGF 1/21 Trach asp: moderate yeast c/w candida, final(insignificant per CCM) 1/21 blood x2: NGF 1/22 bronch washings: 5K colonies/ml, Candida, final(insignificant per CCM) 2/4 BCx: NGTD 2/4 UCx: >100K yeast 2/4 Trach asp: (gram stain: rare GPC in pairs) Final results: Moderate yeast consistent with candida  Thank you for allowing pharmacy to be a part of this patient's care.  Netta Cedars, PharmD, BCPS Pager: [phone] 09/24/2015, 11:21 AM

## 2015-09-24 NOTE — Progress Notes (Signed)
PCCM PROGRESS NOTE  ADMISSION DATE: 08/20/2015 CONSULT DATE: 08/30/2015 REFERRING PROVIDER: Dr. Roel Cluck, [facility]  CC: 40 yo female with progressive dyspnea, wheezing, adm 1/13. 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 S/p chemo -carbo + etoposide Failed extubation x 1 Now with diffuse weakness ? CIN vs paraneoplastic - no cord compression  SUBJECTIVE:  No obvious pain Somnolent Follows commands, oliguric Febrile 101 Off CRRT  VITAL SIGNS: BP 103/58 mmHg  Pulse 146  Temp(Src) 101.3 F (38.5 C) (Core (Comment))  Resp 29  Ht 5' 5.5" (1.664 m)  Wt 167 lb 15.9 oz (76.2 kg)  BMI 27.52 kg/m2  SpO2 94%  LMP 09/15/2015  INTAKE/OUTPUT: I/O last 3 completed shifts: In: 1958.7 [I.V.:80; NG/GT:1528.7; IV Piggyback:350] Out: 0175 [Urine:34; Other:805; Stool:275]  General: awake HENT: NCAT ETT in place PULM: Rhonchi bilaterally, wheezing, vent supported breaths CV: Tachy, regular GI: BS+, soft, nontender Derm: stage 2 with mild surrounding brown changes sacral area about 4-5 cm total Neuro: Awake on vent, nods head, follows commands, weak grip strength,   CBC Recent Labs     09/22/15  0400  09/23/15  0425  09/24/15  0415  WBC  1.0*  1.6*  4.8  HGB  7.2*  7.5*  7.4*  HCT  23.2*  24.2*  23.4*  PLT  29*  18*  15*    Coag's Recent Labs     09/22/15  0400  09/23/15  0425  APTT  98*  27    BMET Recent Labs     09/22/15  0400  09/22/15  1600  09/23/15  0425  NA  141  138  142  K  4.1  4.0  4.1  CL  104  103  104  CO2  '24  27  27  '$ BUN  32*  35*  34*  CREATININE  0.59  0.67  0.65  GLUCOSE  149*  141*  153*    Electrolytes Recent Labs     09/22/15  0400  09/22/15  1600  09/23/15  0425  CALCIUM  9.7  9.5  10.1  MG  2.5*   --   2.5*  PHOS  2.2*  2.4*  1.9*    Sepsis Markers No results for input(s): PROCALCITON, O2SATVEN in the last 72  hours.  Invalid input(s): LACTICACIDVEN  ABG No results for input(s): PHART, PCO2ART, PO2ART in the last 72 hours.  Liver Enzymes Recent Labs     09/22/15  1600  09/23/15  0425  09/23/15  1145  AST   --    --   50*  ALT   --    --   74*  ALKPHOS   --    --   295*  BILITOT   --    --   0.8  ALBUMIN  2.0*  2.1*  2.1*    Cardiac Enzymes No results for input(s): TROPONINI, PROBNP in the last 72 hours.  Glucose Recent Labs     09/21/15  1559  GLUCAP  132*    Imaging Mr Cervical Spine Wo Contrast  09/22/2015  CLINICAL DATA:  40 year old female with right lung mass, ovarian cancer and metastatic disease with paraneoplastic syndrome. Extremity weakness. Initial encounter. EXAM: MRI CERVICAL SPINE WITHOUT CONTRAST TECHNIQUE: Multiplanar, multisequence MR imaging of the cervical spine was performed. No intravenous contrast was administered. COMPARISON:  No comparison cervical spine exam. FINDINGS: Diffuse osseous metastatic  disease throughout all visualized cervical vertebra, upper thoracic vertebra and skullbase. No loss of height of vertebra or epidural extension of tumor. Evaluation slightly limited without contrast. No focal cervical cord signal abnormality. Cervical medullary junction unremarkable. Pleural effusions noted but incompletely assessed. Adenopathy left supraclavicular region and upper mediastinum. C2-3: Minimal bulge.  Minimal narrowing ventral thecal sac. C3-4: Mild bulge. Mild narrowing ventral thecal sac. Minimal foraminal narrowing. C4-5:  Minimal right foraminal narrowing. C5-6:  Minimal right foraminal narrowing. C6-7:  Negative. C7-T1:  Facet degenerative changes greater on the right. IMPRESSION: Diffuse osseous metastatic disease throughout all visualized cervical vertebra, upper thoracic vertebra and skullbase. No loss of height of vertebra or epidural extension of tumor. Evaluation slightly limited without contrast. Pleural effusions noted but incompletely assessed.  Adenopathy left supraclavicular region and upper mediastinum. Minimal to mild degenerative changes most notable C3-4 as noted above. Electronically Signed   By: Genia Del M.D.   On: 09/22/2015 12:48   Dg Chest Port 1 View  09/23/2015  CLINICAL DATA:  Respiratory failure. EXAM: PORTABLE CHEST 1 VIEW COMPARISON:  09/22/2015.  CT 09/19/2015. FINDINGS: Endotracheal tube, left IJ line, NG tube in stable position. Heart size stable. Persistent bilateral pulmonary mass lesions are again noted with the dominant mass in the right lower lobe. Findings consistent metastatic disease. Persistent infiltrate right lower lobe. Persistent moderate right pleural effusion. No pneumothorax. No acute bony abnormality . IMPRESSION: 1. Lines and tubes stable position. 2. Persistent bilateral pulmonary mass lesions consistent metastatic disease. No interim change. Persistent low lung volumes and infiltrate right lower lobe. Persistent moderate right pleural effusion. Electronically Signed   By: Marcello Moores  Register   On: 09/23/2015 07:15    CULTURES: 1/13 Blood >> neg 1/14 Pneumococcal Ag >> negative 1/14 Legionella Ag >> negative 1/15 C diff PCR >> negative 1/22 BAL>>>yeast candida only 5 k BCX2 1/21>>>neg  2/4 resp culture > GPC pairs >>candida 2/4 blood culture > ng 2/4 urine culture > yeast >>  ANTIBIOTICS: 1/13 Rocephin >> 1/13 1/13 Zithromax >> 1/13 1/14 Vancomycin >> 1/19, restart 1/21>>>off 1/14 Zosyn >> 1/20. 1/21 Ceftaz >>>off 2/3 unasyn >>>2/4  2/4 vanc > 2/6 2/4 ceftaz >   LINES/TUBES: 1/15 Rt PICC >> 1/24 Left IJ CVL 1/24>>> HD cath 1/27>>>  STUDIES: 1/13 CT chest >> multiple b/l nodules, mass like consolidation Rt mid lung, Rt hilar and subcarinal mass, Lt hilar LAN, 2.7 cm Rt paratracheal LAN 1/14 CT abd/pelvis >> 10 cm mass superior to uterus 1/15 Tumor markers >> CA 19-9 453, CEA 3.9, CA 125 780.8 1/16 Bronchoscopy >> crush artifact ?small cell lung cancer (non diagnostic) 1/24 lower  ext doppler > left DVT peroneal vein 2/4 CT chest abdomen pelvis> Right lung mass smaller, surrounding consolidation noted, left lung nodules smaller, pelvic mass smaller   EVENTS: 1/13 Admit 1/15 Gyn oncology consulted 1/16 Bronchoscopy in ICU; Placed on BiPAP for wheezing, dyspnea 1/19 IR biopsy>>>Poorly differentiated high Grade Carcinoma.  >stains favor Neuroendocrine. TTF stain was NEGATIVE 1/22 bronch cytology: neg  1/25 Etoposide/carboplatin cycle #1 1/28 EEG>>> diffuse slowly (performed while uremic) 1/31 weaning  2/1: gave fluid challenge and changed to even volume status.  2/2 passed SBT. Extubated. Re-intubated 2/3 again approached family re: goals of care. They want full support 2/5 neurology consult >   ASSESSMENT/PLAN:  PULMONARY A: Acute hypoxic respiratory failure due to lung cancer and profound neuromuscular weakness HCAP on 2/4 CT chest > clear left lung consolidation in lingula and left lower lobe  P: Pressure  support as tolerated , limited by tachy -cannot extubate due to diffuse weakness -no role for tracheostomy here as she has incurable cancer with multi-organ failure  NEUROLOGY A: Cancer pain > minimal Acute Encephalopathy> better after HD but worse again when hypercarbic.  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 vs less likely auto-immune mediated from malignancy (Eaton-Lambert, myasthenia, etc) P: Await results of auto-antibodies sent by Neurology & their input Minimize sedation per family request  Morphine for pain Start PT   CARDIAC A: Persistent  sinus tachycardia P: Tele   RENAL A: AKI> oliguric P: Monitor BMET and UOP Replace electrolytes as needed Keep volume status net even Off CRRT, transition to iHD -will need transfer to [facility] if so   GASTROENTEROLOGY A: Nutrition, cdiff neg, loose stools, abdo distention Pancreatic inflammation on CT but lipase  normal P:  tube feeds @ goal Protonix for SUP Imodium  HEMATOLOGY/ONCOLOGY A: Poorly differentiated high Grade Carcinoma. Advanced & treating as Small Cell   >stains favor Neuroendocrine--chemotherapy completed on 1/25 x1 dose Anemia of critical illness DVT: left peroneal vein and Right UE extending into the Subclavian -->picc removed Anemia -->PRBCs 1/30, 2/6 Stopped Heparin gtt since plt count low & dropping Pancytopenia post chemo  P: Appreciate oncology input Neupogen per oncology  INFECTION A: HCAP based on CT result 2/3 Fever 2/8 ? Line sepsis vs tumor vs DVT P: Continue ceftaz x 7ds Resume vanc  Added diflucan for oropharyngeal candidiasis Dc HD cath - may have to change CVL once plts improve  ENDOCRINE A: No acute issues P: monitor blood glucose on BMET   Family: updated bedside daily.  They remain hopeful despite her dismal prognosis.    Summary - Her profound neuromuscular weakness & advanced cancer are reasons for her respiratory failure.  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 Prognosis very poor. Appreciate palliative care input    The patient is critically ill with multiple organ systems failure and requires high complexity decision making for assessment and support, frequent evaluation and titration of therapies, application of advanced monitoring technologies and extensive interpretation of multiple databases. Critical Care Time devoted to patient care services described in this note independent of APP time is 35 minutes.   [place] [phone]

## 2015-09-24 NOTE — Progress Notes (Signed)
Monique [name] Progress Note   Subjective: no new issues , was on PS for awhile, going back to vent support  Filed Vitals:   09/24/15 0736 09/24/15 0749 09/24/15 0800 09/24/15 0900  BP:  108/68 103/58 112/63  Pulse: 146 145 146 133  Temp: 101.1 F (38.4 C)  101.3 F (38.5 C) 101.3 F (38.5 C)  TempSrc: Core (Comment)  Core (Comment)   Resp: 33 '26 29 27  '$ Height:      Weight:      SpO2: 94% 94% 94% 90%    Inpatient medications: . antiseptic oral rinse  7 mL Mouth Rinse QID  . budesonide (PULMICORT) nebulizer solution  0.5 mg Nebulization BID  . cefTAZidime (FORTAZ)  IV  1 g Intravenous Q8H  . chlorhexidine  15 mL Mouth Rinse BID  . feeding supplement (NEPRO CARB STEADY)  1,000 mL Oral Q24H  . feeding supplement (PRO-STAT SUGAR FREE 64)  30 mL Per Tube BID  . filgrastim  480 mcg Subcutaneous q1800  . fluconazole (DIFLUCAN) IV  200 mg Intravenous Q24H  . levalbuterol  0.63 mg Nebulization Q6H  . pantoprazole sodium  40 mg Per Tube Daily  . sodium chloride  10-40 mL Intracatheter Q12H  . vancomycin  1,000 mg Intravenous Once   . dextrose 5 % and 0.2 % NaCl 40 mL (09/24/15 0555)   alteplase, artificial tears, fentaNYL (SUBLIMAZE) injection, fentaNYL (SUBLIMAZE) injection, heparin, heparin lock flush, heparin lock flush, hydrALAZINE, levalbuterol, loperamide, metoprolol, morphine injection, [DISCONTINUED] ondansetron **OR** ondansetron (ZOFRAN) IV, polyvinyl alcohol, simethicone, sodium chloride, sodium chloride flush, sodium chloride flush  Exam: More alert, opens eyes, nods head in response to voice No jvd Chest coarse BS bilat RRR no mrg Abd soft mod distended no mass GU foley in place Trace LE edema/ UE edema bilat Left femoral cath placed 1/27 Neuro as above     CXR 2/6 RLL opacity and lung masses w no change, no CHF  Assessment: 1 Acute renal failure - oliguric, prob ATN. Off CRRT 1 day. Labs pending. Has new fever/ tachycardia and may require  resumption of CRRT if becomes unstable hemodynamically.  Keep at [facility] for now. 2 Volume - +diarrhea, replacing GI losses 3 Anemia transfusing prn 4 Metastatic cancer, neuroendocrine type- s/p chemo x 1 on 1/25 w carbo/etoposide 5 Resp failure on vent 6 Leukopenia s/p chemo, better today 7 ID - per primary  Plan - as above   Kelly Splinter MD [name] pager [phone]    cell [phone] 09/24/2015, 10:05 AM    Recent Labs Lab 09/22/15 0400 09/22/15 1600 09/23/15 0425  NA 141 138 142  K 4.1 4.0 4.1  CL 104 103 104  CO2 '24 27 27  '$ GLUCOSE 149* 141* 153*  BUN 32* 35* 34*  CREATININE 0.59 0.67 0.65  CALCIUM 9.7 9.5 10.1  PHOS 2.2* 2.4* 1.9*    Recent Labs Lab 09/22/15 1600 09/23/15 0425 09/23/15 1145  AST  --   --  50*  ALT  --   --  74*  ALKPHOS  --   --  295*  BILITOT  --   --  0.8  PROT  --   --  6.4*  ALBUMIN 2.0* 2.1* 2.1*    Recent Labs Lab 09/22/15 0400 09/23/15 0425 09/24/15 0415  WBC 1.0* 1.6* 4.8  NEUTROABS 0.6* 0.8* 1.4*  HGB 7.2* 7.5* 7.4*  HCT 23.2* 24.2* 23.4*  MCV 88.9 88.3 86.0  PLT 29* 18* 15*

## 2015-09-24 NOTE — Progress Notes (Signed)
IP PROGRESS NOTE  Subjective:   She remains on the ventilator. Her husband and other family members are at the bedside.  Objective: Vital signs in last 24 hours: Blood pressure 108/68, pulse 145, temperature 101.1 F (38.4 C), temperature source Core (Comment), resp. rate 26, height 5' 5.5" (1.664 m), weight 167 lb 15.9 oz (76.2 kg), last menstrual period 09/11/2015, SpO2 94 %.  Intake/Output from previous day: 02/07 0701 - 02/08 0700 In: 1301.7 [I.V.:63; NG/GT:988.7; IV Piggyback:250] Out: 563 [Urine:25; Stool:200]  Physical Exam:  HEENT: ET tube in place Lungs: Bilateral rhonchi at the anterior chest Cardiac: Distant heart sounds, tachycardia Abdomen: Mildly distended Vascular: No leg edema Neurologic: Opens eyes, otherwise not responding  Neurologic: Opens eyes, squeezes hands to command, minimal toe movement bilaterally  Left IJ catheter and left femoral dialysis catheter sites without erythema  Lab Results:  Recent Labs  09/23/15 0425 09/24/15 0415  WBC 1.6* 4.8  HGB 7.5* 7.4*  HCT 24.2* 23.4*  PLT 18* 15*   ANC 1.4   BMET  Recent Labs  09/22/15 1600 09/23/15 0425  NA 138 142  K 4.0 4.1  CL 103 104  CO2 27 27  GLUCOSE 141* 153*  BUN 35* 34*  CREATININE 0.67 0.65  CALCIUM 9.5 10.1    Studies/Results: Mr Cervical Spine Wo Contrast  09/22/2015  CLINICAL DATA:  40 year old female with right lung mass, ovarian cancer and metastatic disease with paraneoplastic syndrome. Extremity weakness. Initial encounter. EXAM: MRI CERVICAL SPINE WITHOUT CONTRAST TECHNIQUE: Multiplanar, multisequence MR imaging of the cervical spine was performed. No intravenous contrast was administered. COMPARISON:  No comparison cervical spine exam. FINDINGS: Diffuse osseous metastatic disease throughout all visualized cervical vertebra, upper thoracic vertebra and skullbase. No loss of height of vertebra or epidural extension of tumor. Evaluation slightly limited without contrast. No  focal cervical cord signal abnormality. Cervical medullary junction unremarkable. Pleural effusions noted but incompletely assessed. Adenopathy left supraclavicular region and upper mediastinum. C2-3: Minimal bulge.  Minimal narrowing ventral thecal sac. C3-4: Mild bulge. Mild narrowing ventral thecal sac. Minimal foraminal narrowing. C4-5:  Minimal right foraminal narrowing. C5-6:  Minimal right foraminal narrowing. C6-7:  Negative. C7-T1:  Facet degenerative changes greater on the right. IMPRESSION: Diffuse osseous metastatic disease throughout all visualized cervical vertebra, upper thoracic vertebra and skullbase. No loss of height of vertebra or epidural extension of tumor. Evaluation slightly limited without contrast. Pleural effusions noted but incompletely assessed. Adenopathy left supraclavicular region and upper mediastinum. Minimal to mild degenerative changes most notable C3-4 as noted above. Electronically Signed   By: Monique Del M.D.   On: 09/22/2015 12:48   Dg Chest Port 1 View  09/23/2015  CLINICAL DATA:  Respiratory failure. EXAM: PORTABLE CHEST 1 VIEW COMPARISON:  09/22/2015.  CT 09/19/2015. FINDINGS: Endotracheal tube, left IJ line, NG tube in stable position. Heart size stable. Persistent bilateral pulmonary mass lesions are again noted with the dominant mass in the right lower lobe. Findings consistent metastatic disease. Persistent infiltrate right lower lobe. Persistent moderate right pleural effusion. No pneumothorax. No acute bony abnormality . IMPRESSION: 1. Lines and tubes stable position. 2. Persistent bilateral pulmonary mass lesions consistent metastatic disease. No interim change. Persistent low lung volumes and infiltrate right lower lobe. Persistent moderate right pleural effusion. Electronically Signed   By: Monique Moores  [name]   On: 09/23/2015 07:15    Medications: I have reviewed the patient's current medications.  Assessment/Plan:  1. Metastatic carcinoma with a dominant  right lung mass, bilateral lung masses, chest/abdominal  lymphadenopathy, and a pelvic mass  Bronchoscopy 09/01/2015 revealed endobronchial lesions at the right upper lobe and right middle lobe, biopsies revealed malignant cells without a definitive diagnosis  CT-guided biopsy of a respiratory lymph node 09/04/2015 confirmed poorly differential carcinoma, with some features of a neuroendocrine carcinoma.  Cycle 1 etoposide/carboplatin 09/10/2015  CTs chest, abdomen, and pelvis on 09/19/2015-mild decrease in dominant right lower lobe mass, new cell adenopathy, and pulmonary masses. Slight enlargement of the pelvic mass.  MRI cervical spine 09/22/2015 with diffuse bone metastases, no epidural tumor  2. Respiratory failure secondary to #1 and pneumonia, and possibly motor weakness secondary to deconditioning or a paraneoplastic syndrome  3. Renal failure-maintained on CRRT   4. Left leg and right upper extremity DVTs 09/09/2015, on heparin  5.  Altered mental status secondary to uremia and respiratory failure  6.  Anemia secondary to phlebotomy, metastatic carcinoma, and renal failure, status post a red cell transfusion 09/15/2015  7.  Leukopenia/thrombocytopenia secondary to chemotherapy, G-CSF started 09/20/2015  8.  Extremity weakness-most likely related to deconditioning/critical illness   9.  Fever-concern for infection  Monique [name] is now at day 15 following a first cycle of etoposide/carboplatin. The neutrophil count is recovering. She has stable severe thrombocytopenia. The plan is to provide platelet transfusion support for bleeding or a count of less than 10,000.   She remains critically ill with multiorgan failure. Minimal urine output since discontinuation of CRRT. She has a fever this morning concerning for an infection. I discussed the case with the critical care service. They will evaluate the fever.  Her family was updated at the bedside. Recommendations: 1. Continue  management of respiratory failure per critical care medicine 2. Daily CBC/differential, discontinue G-CSF if the [place] is higher tomorrow 3. Transfuse platelets for a count of less than 10,000 or bleeding    LOS: 25 days   Monique [name]  09/24/2015, 8:24 AM

## 2015-09-25 DIAGNOSIS — C3490 Malignant neoplasm of unspecified part of unspecified bronchus or lung: Secondary | ICD-10-CM

## 2015-09-25 LAB — CBC WITH DIFFERENTIAL/PLATELET
BASOS PCT: 2 %
Basophils Absolute: 0.3 10*3/uL — ABNORMAL HIGH (ref 0.0–0.1)
EOS ABS: 0 10*3/uL (ref 0.0–0.7)
Eosinophils Relative: 0 %
HCT: 23.1 % — ABNORMAL LOW (ref 36.0–46.0)
Hemoglobin: 7.2 g/dL — ABNORMAL LOW (ref 12.0–15.0)
LYMPHS PCT: 15 %
Lymphs Abs: 2.2 10*3/uL (ref 0.7–4.0)
MCH: 27.6 pg (ref 26.0–34.0)
MCHC: 31.2 g/dL (ref 30.0–36.0)
MCV: 88.5 fL (ref 78.0–100.0)
MONOS PCT: 12 %
Monocytes Absolute: 1.8 10*3/uL — ABNORMAL HIGH (ref 0.1–1.0)
NEUTROS PCT: 71 %
Neutro Abs: 10.5 10*3/uL — ABNORMAL HIGH (ref 1.7–7.7)
PLATELETS: 27 10*3/uL — AB (ref 150–400)
RBC: 2.61 MIL/uL — ABNORMAL LOW (ref 3.87–5.11)
RDW: 16.5 % — ABNORMAL HIGH (ref 11.5–15.5)
WBC: 14.8 10*3/uL — ABNORMAL HIGH (ref 4.0–10.5)

## 2015-09-25 LAB — CULTURE, BLOOD (ROUTINE X 2)
CULTURE: NO GROWTH
Culture: NO GROWTH
Special Requests: NORMAL
Special Requests: NORMAL

## 2015-09-25 LAB — BASIC METABOLIC PANEL
Anion gap: 18 — ABNORMAL HIGH (ref 5–15)
BUN: 135 mg/dL — AB (ref 6–20)
CO2: 18 mmol/L — ABNORMAL LOW (ref 22–32)
Calcium: 10.9 mg/dL — ABNORMAL HIGH (ref 8.9–10.3)
Chloride: 102 mmol/L (ref 101–111)
Creatinine, Ser: 2.35 mg/dL — ABNORMAL HIGH (ref 0.44–1.00)
GFR, EST AFRICAN AMERICAN: 29 mL/min — AB (ref >60)
GFR, EST NON AFRICAN AMERICAN: 25 mL/min — AB (ref >60)
Glucose, Bld: 138 mg/dL — ABNORMAL HIGH (ref 65–99)
POTASSIUM: 4.5 mmol/L (ref 3.5–5.1)
SODIUM: 138 mmol/L (ref 135–145)

## 2015-09-25 LAB — RENAL FUNCTION PANEL
Albumin: 1.9 g/dL — ABNORMAL LOW (ref 3.5–5.0)
Anion gap: 13 (ref 5–15)
BUN: 114 mg/dL — ABNORMAL HIGH (ref 6–20)
CALCIUM: 11.1 mg/dL — AB (ref 8.9–10.3)
CHLORIDE: 102 mmol/L (ref 101–111)
CO2: 22 mmol/L (ref 22–32)
CREATININE: 2.07 mg/dL — AB (ref 0.44–1.00)
GFR, EST AFRICAN AMERICAN: 33 mL/min — AB (ref >60)
GFR, EST NON AFRICAN AMERICAN: 29 mL/min — AB (ref >60)
Glucose, Bld: 165 mg/dL — ABNORMAL HIGH (ref 65–99)
Phosphorus: 5.4 mg/dL — ABNORMAL HIGH (ref 2.5–4.6)
Potassium: 4.5 mmol/L (ref 3.5–5.1)
SODIUM: 137 mmol/L (ref 135–145)

## 2015-09-25 LAB — VANCOMYCIN, RANDOM: VANCOMYCIN RM: 26 ug/mL

## 2015-09-25 NOTE — Progress Notes (Signed)
IP PROGRESS NOTE  Subjective:   She remains less responsive. She was treated for hypercalcemia yesterday. Her husband and other family members are at the bedside.  Objective: Vital signs in last 24 hours: Blood pressure 131/74, pulse 136, temperature 100.2 F (37.9 C), temperature source Core (Comment), resp. rate 22, height 5' 5.5" (1.664 m), weight 171 lb 11.8 oz (77.9 kg), last menstrual period 09/05/2015, SpO2 94 %.  Intake/Output from previous day: 02/08 0701 - 02/09 0700 In: 2211.7 [I.V.:625; NG/GT:920; IV Piggyback:656.7] Out: 665 [Urine:240; Stool:425]  Physical Exam:  HEENT: ET tube in place   Neurologic: Opens eyes, otherwise not responding  Lab Results:  Recent Labs  09/24/15 0415 09/25/15 0545  WBC 4.8 14.8*  HGB 7.4* 7.2*  HCT 23.4* 23.1*  PLT 15* 27*   ANC 10.4   BMET  Recent Labs  09/24/15 1000 09/25/15 0545  NA 139 137  K 4.6 4.5  CL 104 102  CO2 23 22  GLUCOSE 134* 165*  BUN 82* 114*  CREATININE 1.51* 2.07*  CALCIUM 11.9* 11.1*    Studies/Results: Dg Chest Port 1 View  09/24/2015  CLINICAL DATA:  Acute respiratory failure, history of metastatic disease EXAM: PORTABLE CHEST 1 VIEW COMPARISON:  09/23/2015 FINDINGS: Endotracheal tube, left-sided jugular central line and nasogastric catheter are again identified and stable. Patchy nodular changes are noted throughout both lungs consistent with the patient's given clinical history of metastatic disease. Right-sided pleural effusion is noted which is increased slightly in the interval from the prior exam. No pneumothorax is seen. IMPRESSION: Changes consistent with known metastatic disease. Increasing right-sided pleural effusion. Tubes and lines as described. Electronically Signed   By: Inez Catalina M.D.   On: 09/24/2015 10:36    Medications: I have reviewed the patient's current medications.  Assessment/Plan:  1. Metastatic carcinoma with a dominant right lung mass, bilateral lung masses,  chest/abdominal lymphadenopathy, and a pelvic mass  Bronchoscopy 09/01/2015 revealed endobronchial lesions at the right upper lobe and right middle lobe, biopsies revealed malignant cells without a definitive diagnosis  CT-guided biopsy of a respiratory lymph node 09/04/2015 confirmed poorly differential carcinoma, with some features of a neuroendocrine carcinoma.  Cycle 1 etoposide/carboplatin 09/10/2015  CTs chest, abdomen, and pelvis on 09/19/2015-mild decrease in dominant right lower lobe mass, new cell adenopathy, and pulmonary masses. Slight enlargement of the pelvic mass.  MRI cervical spine 09/22/2015 with diffuse bone metastases, no epidural tumor  2. Respiratory failure secondary to #1 and pneumonia, and possibly motor weakness secondary to deconditioning or a paraneoplastic syndrome  3. Renal failure-now making urine, off of CRRT  4. Left leg and right upper extremity DVTs 09/09/2015, on heparin  5.  Altered mental status secondary to uremia and respiratory failure  6.  Anemia secondary to phlebotomy, metastatic carcinoma, and renal failure, status post a red cell transfusion 09/15/2015  7.  Leukopenia/thrombocytopenia secondary to chemotherapy, improved  8.  Extremity weakness-most likely related to deconditioning/critical illness   9.  Fever, on Diflucan for treatment of Candida in the sputum  10. Hypercalcemia-status post pamidronate 09/24/2015  [name] is now at day 16 following a first cycle of etoposide/carboplatin. The white count has recovered and the platelets are improved today.    She remains critically ill with multiorgan failure. The chance of recovery from the current illness remains poor. She is not a candidate for further chemotherapy with the severe thrombocytopenia. I am concerned the hypercalcemia may indicate disease progression.  Her family was updated at the bedside.  She is less responsive over the past few days. This may be related to the  hypercalcemia.  Recommendations: 1. Continue management of respiratory failure per critical care medicine 2. Discontinue G-CSF 3. Continue calcitonin and follow-up calcium level    LOS: 26 days   Monique [name]  09/25/2015, 1:20 PM

## 2015-09-25 NOTE — Progress Notes (Signed)
[facility] Progress Note   Subjective: Ca up to 13-14 corrected yesterday, started to climb after CRRT stopped but was normal before starting on CRRT.  Started calcitonin/ pamidronate yest. Making urine 240 cc yest , first time. BP's good, no pressors. CXR no new chgs in IS pattern, CVP 10-11, wt up 78kg. Diarrhea resolved. WBC/ plts coming up.  Temp HD cath dc'd yesterday. BUN ^^ > creat increase.   Filed Vitals:   09/25/15 0600 09/25/15 0700 09/25/15 0800 09/25/15 0805  BP: 175/90 139/69 136/70 136/70  Pulse: 136 138 135 134  Temp: 99.7 F (37.6 C) 99.3 F (37.4 C) 99.9 F (37.7 C)   TempSrc:   Core (Comment)   Resp: '22 21 21 22  '$ Height:      Weight: 77.9 kg (171 lb 11.8 oz)     SpO2: 96% 94% 94% 94%    Inpatient medications: . antiseptic oral rinse  7 mL Mouth Rinse QID  . budesonide (PULMICORT) nebulizer solution  0.5 mg Nebulization BID  . calcitonin  300 Units Intramuscular Q12H  . cefTAZidime (FORTAZ)  IV  2 g Intravenous Q24H  . chlorhexidine  15 mL Mouth Rinse BID  . feeding supplement (NEPRO CARB STEADY)  1,000 mL Oral Q24H  . feeding supplement (PRO-STAT SUGAR FREE 64)  30 mL Per Tube BID  . fluconazole (DIFLUCAN) IV  200 mg Intravenous Q24H  . levalbuterol  0.63 mg Nebulization Q6H  . pantoprazole sodium  40 mg Per Tube Daily  . sodium chloride  10-40 mL Intracatheter Q12H     alteplase, artificial tears, fentaNYL (SUBLIMAZE) injection, fentaNYL (SUBLIMAZE) injection, heparin, heparin lock flush, heparin lock flush, hydrALAZINE, levalbuterol, loperamide, metoprolol, morphine injection, [DISCONTINUED] ondansetron **OR** ondansetron (ZOFRAN) IV, polyvinyl alcohol, simethicone, sodium chloride, sodium chloride flush, sodium chloride flush  Exam: On vent, somnolent, poorly responsive No jvd Chest coarse BS bilat RRR no mrg Abd soft mod distended no mass GU foley in place, dark clear urine in tube Diffuse 1+ edema Neuro as above     CXR 2/8 RLL  opacity and lung masses w no change, no gross CHF  Assessment: 1 Acute renal failure - oliguric, prob ATN. Off of CRRT 2 days. Making urine now which is new. Will hold RRT for now.  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 Calcitonin + pamidronate x 1.   Plan - as above. Would try to get off of IV vanc if possible, could substitute dapto if needed. Have d/w primary team.    Kelly Splinter MD [facility] pager [phone]    cell [phone] 09/25/2015, 9:25 AM    Recent Labs Lab 09/23/15 0425 09/24/15 1000 09/25/15 0545  NA 142 139 137  K 4.1 4.6 4.5  CL 104 104 102  CO2 '27 23 22  '$ GLUCOSE 153* 134* 165*  BUN 34* 82* 114*  CREATININE 0.65 1.51* 2.07*  CALCIUM 10.1 11.9* 11.1*  PHOS 1.9* 3.9 5.4*    Recent Labs Lab 09/23/15 1145 09/24/15 1000 09/25/15 0545  AST 50*  --   --   ALT 74*  --   --   ALKPHOS 295*  --   --   BILITOT 0.8  --   --  PROT 6.4*  --   --   ALBUMIN 2.1* 2.0* 1.9*    Recent Labs Lab 09/23/15 0425 09/24/15 0415 09/25/15 0545  WBC 1.6* 4.8 14.8*  NEUTROABS 0.8* 1.4* 10.5*  HGB 7.5* 7.4* 7.2*  HCT 24.2* 23.4* 23.1*  MCV 88.3 86.0 88.5  PLT 18* 15* 27*

## 2015-09-25 NOTE — Progress Notes (Signed)
OT Cancellation Note  Patient Details Name: Monique [name] MRN: [id number] DOB: 11/22/1975   Cancelled Treatment:    Reason Eval/Treat Not Completed: Medical issues which prohibited therapy . Pt is not medically ready for OT.  Per notes, family is performing PROM.  If medical status improves, and pt would benefit from OT, please reorder.  Thanks.    Johnell Landowski 09/25/2015, 1:15 PM  Lesle Chris, OTR/L [phone] 09/25/2015

## 2015-09-25 NOTE — Progress Notes (Signed)
Pharmacy Antibiotic Note  Monique [name] is a 40 y.o. female admitted on 08/18/2015. Pharmacy has been consulted for vancomycin and ceftazidime dosing for HCAP.  Vancomycin stopped on 2/5, but patient now spiking fevers so pharmacy asked to resume Vancomycin dosing.  2/7 CXR shows persistent RLL infiltrate.  Urine culture and trach aspirate growing moderate yeast and RN notes white colored tongue.  CCM thinks pt likely colonized but will treat for oral candidiasis x 3 days.  CRRT has been stopped, however minimal UOP.  Antibiotics doses will be monitored/adjusted as needed.     Afebrile today  Random Vancomycin level = 26 (goal <20)  WBC increased  Plan:  Continue Ceftazidime 2g IV q24h (D6/7)  Complete Fluconazole '200mg'$  IV x 3 doses per CCM   No further Vancomycin needed today.  F/U random Vanc level in am.  Follow up cultures and sensitivities, renal function  Height: 5' 5.5" (166.4 cm) Weight: 171 lb 11.8 oz (77.9 kg) IBW/kg (Calculated) : 58.15  Temp (24hrs), Avg:100.5 F (38.1 C), Min:99.3 F (37.4 C), Max:101.3 F (38.5 C)   Recent Labs Lab 09/21/15 0440  09/22/15 0400 09/22/15 1600 09/23/15 0425 09/24/15 0415 09/24/15 1000 09/25/15 0545  WBC 1.1*  --  1.0*  --  1.6* 4.8  --  14.8*  CREATININE 0.64  < > 0.59 0.67 0.65  --  1.51* 2.07*  VANCORANDOM  --   --   --   --   --   --   --  26  < > = values in this interval not displayed.  Estimated Creatinine Clearance: 37.7 mL/min (by C-G formula based on Cr of 2.07).    No Known Allergies  Antimicrobials this admission: 1/13 >> Rocephin >> 1/13 1/13 >> Zithromax >> 1/13 1/14 >> Vanc >> 1/19, 1/21 >> Vanc >> 1/25 1/14 >> Zosyn >> 1/20 1/21 >> Fortaz >> 1/31 1/28 >> Diflucan >> 1/31 2/2 >>Unasyn >> 2/4 2/4 >> Vanc >> 2/5; 2/8>> 2/4 >> Ceftaz >> 2/7 >> Fluconazole x 3 (2/9)  Dose adjustments this admission: 1/15 1900 VT: 15 on 1g IV q8h 1/18 1130 VT: 15 on 1g IV q8h 1/24 1500 VT: 36 on '750mg'$  IV q8h =  hold Vanc. Decrease ceftazidime to 1g IV q12h.  1/25 0500 Vrandom: 28. Ke ~0.017, T1/2 ~41hrs. Vanc dc'd by CCM. 2/9: 2/9 0500 Vrandom 26 after Vanc 1gm x1 at 1051 on 2/8  Microbiology results: 1/14 HIV screen: neg 1/14 S. pneumo UAg: neg 1/14 Legionella UAg: neg 1/14 MRSA PCR: neg 1/15 Cdiff PCR: neg 1/13 blood x2: NGF 1/21 Trach asp: moderate yeast c/w candida, final(insignificant per CCM) 1/21 blood x2: NGF 1/22 bronch washings: 5K colonies/ml, Candida, final(insignificant per CCM) 2/4 BCx: NGTD 2/4 UCx: >100K yeast 2/4 Trach asp: (gram stain: rare GPC in pairs) Final results: Moderate yeast consistent with candida  Thank you for allowing pharmacy to be a part of this patient's care.  Netta Cedars, PharmD, BCPS Pager: [phone] 09/25/2015, 8:41 AM

## 2015-09-25 NOTE — Progress Notes (Signed)
PCCM PROGRESS NOTE  ADMISSION DATE: 09/05/2015 CONSULT DATE: 08/30/2015 REFERRING PROVIDER: Dr. Roel Cluck, [facility]  BRIEF Hx:  40 yo female with no PMH admitted 1/13 with progressive dyspnea, wheezing. 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  S/p chemo -carbo + etoposide.  Failed extubation x 1.  Now with diffuse weakness ? CIN vs paraneoplastic - no cord compression on MRI.   SUBJECTIVE:  Tmax 100 in last 24 hours, BUN rising, UOP 22m, no further diarrhea.  WBC/Platlets improving     VITAL SIGNS: BP 133/76 mmHg  Pulse 140  Temp(Src) 100 F (37.8 C) (Core (Comment))  Resp 20  Ht 5' 5.5" (1.664 m)  Wt 171 lb 11.8 oz (77.9 kg)  BMI 28.13 kg/m2  SpO2 94%  LMP 09/07/2015  INTAKE/OUTPUT: I/O last 3 completed shifts: In: 2905 [I.V.:708.3; Other:10; NG/GT:1430; IV Piggyback:756.7] Out: 705 [Urine:255; Stool:450]  General: awake, appears critically ill  HENT: NCAT ETT in place PULM: non-labored on vent, lungs bilaterally coarse with crackles  CV: Tachy, regular GI: BS+, soft, nontender Derm: stage 2 with mild surrounding brown changes sacral area about 4-5 cm total Neuro: arouses to voice, follows simple commands but very weak  CBC Recent Labs     09/23/15  0425  09/24/15  0415  09/25/15  0545  WBC  1.6*  4.8  14.8*  HGB  7.5*  7.4*  7.2*  HCT  24.2*  23.4*  23.1*  PLT  18*  15*  27*    Coag's Recent Labs     09/23/15  0425  APTT  27    BMET Recent Labs     09/23/15  0425  09/24/15  1000  09/25/15  0545  NA  142  139  137  K  4.1  4.6  4.5  CL  104  104  102  CO2  '27  23  22  '$ BUN  34*  82*  114*  CREATININE  0.65  1.51*  2.07*  GLUCOSE  153*  134*  165*    Electrolytes Recent Labs     09/23/15  0425  09/24/15  1000  09/25/15  0545  CALCIUM  10.1  11.9*  11.1*  MG  2.5*   --    --   PHOS  1.9*  3.9  5.4*    Sepsis Markers No  results for input(s): PROCALCITON, O2SATVEN in the last 72 hours.  Invalid input(s): LACTICACIDVEN  ABG No results for input(s): PHART, PCO2ART, PO2ART in the last 72 hours.  Liver Enzymes Recent Labs     09/23/15  1145  09/24/15  1000  09/25/15  0545  AST  50*   --    --   ALT  74*   --    --   ALKPHOS  295*   --    --   BILITOT  0.8   --    --   ALBUMIN  2.1*  2.0*  1.9*    Cardiac Enzymes No results for input(s): TROPONINI, PROBNP in the last 72 hours.  Glucose No results for input(s): GLUCAP in the last 72 hours.  Imaging Dg Chest Port 1 View  09/24/2015  CLINICAL DATA:  Acute respiratory failure, history of metastatic disease EXAM: PORTABLE CHEST 1 VIEW COMPARISON:  09/23/2015 FINDINGS: Endotracheal tube, left-sided jugular central line and nasogastric catheter are again identified and stable. Patchy nodular changes are noted throughout  both lungs consistent with the patient's given clinical history of metastatic disease. Right-sided pleural effusion is noted which is increased slightly in the interval from the prior exam. No pneumothorax is seen. IMPRESSION: Changes consistent with known metastatic disease. Increasing right-sided pleural effusion. Tubes and lines as described. Electronically Signed   By: Inez Catalina M.D.   On: 09/24/2015 10:36    CULTURES: 1/13 Blood >> neg 1/14 Pneumococcal Ag >> negative 1/14 Legionella Ag >> negative 1/15 C diff PCR >> negative 1/22 BAL >> yeast candida only 5 k 1/21 BCx2  >> neg 2/04 resp culture > GPC pairs >>candida 2/04 blood culture > ng 2/04 urine culture >> yeast   ANTIBIOTICS: 1/13 Rocephin >> 1/13 1/13 Zithromax >> 1/13 1/14 Vancomycin >> 1/19, restart 1/21>>>off 1/14 Zosyn >> 1/20 1/21 Ceftaz >> off 2/03 unasyn >> 2/4  Vanc 2/4 >> 2/9 Ceftaz 2/4 >> Difulcan 2/7 >> 2/9  LINES/TUBES: 1/15 Rt PICC >> 1/24 Left IJ CVL 1/24 >> HD cath 1/27 >> 2/8  STUDIES: 1/13 CT chest >> multiple b/l nodules, mass like  consolidation Rt mid lung, Rt hilar and subcarinal mass, Lt hilar LAN, 2.7 cm Rt paratracheal LAN 1/14 CT abd/pelvis >> 10 cm mass superior to uterus 1/15 Tumor markers >> CA 19-9 453, CEA 3.9, CA 125 780.8 1/16 Bronchoscopy >> crush artifact ?small cell lung cancer (non diagnostic) 1/24 lower ext doppler > left DVT peroneal vein 2/04 CT chest abdomen pelvis >> Right lung mass smaller, surrounding consolidation noted, left lung nodules smaller, pelvic mass smaller 2/09 EEG >>    EVENTS: 1/13 Admit 1/15 Gyn oncology consulted 1/16 Bronchoscopy in ICU; Placed on BiPAP for wheezing, dyspnea 1/19 IR biopsy >> Poorly differentiated high Grade Carcinoma >stains favor Neuroendocrine. TTF stain was NEGATIVE 1/22 bronch cytology: neg  1/25 Etoposide/carboplatin cycle #1 1/28 EEG>>> diffuse slowly (performed while uremic) 1/31 weaning  2/01 gave fluid challenge and changed to even volume status.  2/02 passed SBT. Extubated. Re-intubated 2/03 again approached family re: goals of care. They want full support 2/05 neurology consult  2/09 Off CVVHD, some UOP, rising BUN/CR, tmax 100    ASSESSMENT/PLAN:  PULMONARY A: Acute hypoxic respiratory failure due to lung cancer and profound neuromuscular weakness HCAP on 2/4 CT chest > clear left lung consolidation in lingula and left lower lobe P: PRVC 8 cc/kg as rest mode PSV wean as tolerated, limited by tachy -cannot extubate due to diffuse weakness No role for tracheostomy here as she has incurable cancer with multi-organ failure  NEUROLOGY A: Cancer pain  Acute Encephalopathy - better after HD, has been intermittently altered with hypercarbia   Profound neuromuscular weakness -  MRI c-spine with diffuse bone mets but no cord compression. 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 vs less likely auto-immune mediated from malignancy (Eaton-Lambert, myasthenia, etc) P: Await results of auto-antibodies sent by Neurology & their  input Minimize sedation per family request  Morphine for pain PT as tolerated   CARDIAC A: Persistent Sinus Tachycardia  P: ICU monitoring of hemodynamics   RENAL A: AKI - oliguric, has required CVVHD P: Appreciate Nephrology input  Plan to wait for HD cath insertion until renal definitively decides she needs HD as currently making some urine Nephrology feels if HD restarted, she will likely need CRRT (as of 2/9) Monitor BMET and UOP Replace electrolytes as needed Keep volume status net even to slightly positive with hopes of renal recovery (not likely to be extubated soon)   GASTROENTEROLOGY A: Severe  Protein Calorie Malnutrition Loose Stool -  cdiff neg, resolved  Abdominal Distention  Pancreatic inflammation on CT but lipase normal P: Continue tube feeds Protonix for SUP Imodium Nutrition consult  HEMATOLOGY/ONCOLOGY A: 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 Line sepsis vs tumor vs DVT.  ONC feels likely related to timing of chemo  P: Continue ceftaz, D6/7 D/C vancomycin 2/9 with renal fx, consider Dapto for GP coverage if needed Completed diflucan for oropharyngeal candidiasis Hold off on HD cath placement for now Attempt to establish PIV and d/c central line  ENDOCRINE A: Hyperglycemia - mild, no hx of DM P: Monitor glucose on BMP    Family: updated bedside daily.  They remain hopeful despite her dismal prognosis.    Summary - Her profound neuromuscular weakness & advanced cancer are  reasons for her respiratory failure.  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 Prognosis very poor.  Appreciate palliative care input    Noe Gens, NP-[facility] Pgr: [phone] or if no answer [phone] 09/25/2015, 9:45 AM

## 2015-09-25 NOTE — Progress Notes (Signed)
Nutrition Follow-up  DOCUMENTATION CODES:   Not applicable  INTERVENTION:  NePro 12m/hr ProStat 346mBID TF Regimen provides 1928 calories, 107g protein, 698 ml H2O   NUTRITION DIAGNOSIS:   Inadequate oral intake related to inability to eat as evidenced by NPO status.  ongoing  GOAL:   Patient will meet greater than or equal to 90% of their needs  meeting  MONITOR:   TF tolerance, Skin, Vent status, Labs, I & O's GOC  REASON FOR ASSESSMENT:   Consult Enteral/tube feeding initiation and management  ASSESSMENT:   Presented with about the month worsens worsening shortness of breath and wheezing. She has been seen for this by her primary care provider diagnosed with pneumonia based on chest x-ray and completed antibiotics is not improvement repeat chest x-ray showed persistent pneumonia she had a total three courses of antibiotics Including z-pack, levaquin and clarithromycin. Suspect that she has a malignancy of sort, however, it is unclear what the primary is;  Multi-compartmental masses (chest, adenopathy, ovarian).   Needs re-calculated.  Patient is currently intubated on ventilator support MV: 9.9 L/min Temp (24hrs), Avg:100.4 F (38 C), Min:99.3 F (37.4 C), Max:101.3 F (38.5 C)  Propofol: none  Patient now off CRRT. Creating new urine, 240cc. Wt is up to 78kg. Family was seen by Palliative to determine goals of Care, still have not been addressed. Pt has poor prognosis at this time, running constant fevers. Off pressors at this point in time. Per NP note, pt is vent dependent due to neuromuscular weakness and advanced cancer. Follow for GOC.  Labs: CBGs 118-139, BUN 114, Cr 2.07, Ca 11.1, EGFR 29, Phos 5.4, Mg 2.5     Diet Order:  Diet NPO time specified  Skin:  Wound (see comment) (Stage II buttocks ulcer)  Last BM:  2/6  Height:   Ht Readings from Last 1 Encounters:  09/13/15 5' 5.5" (1.664 m)    Weight:   Wt Readings from Last 1  Encounters:  09/25/15 171 lb 11.8 oz (77.9 kg)    Ideal Body Weight:  57.95 kg (kg)  BMI:  Body mass index is 28.13 kg/(m^2).  Estimated Nutritional Needs:   Kcal:  1926  Protein:  110-120g  Fluid:  1.8L/day  EDUCATION NEEDS:   No education needs identified at this time  WiSatira AnisWard, MS, RD LDN After Hours/Weekend Pager [phone]

## 2015-09-25 NOTE — Progress Notes (Signed)
   09/25/15 1100  Clinical Encounter Type  Visited With Patient and family together  Visit Type Follow-up;Psychological support;Spiritual support;Critical Care  Referral From Family  Consult/Referral To Chaplain  Spiritual Encounters  Spiritual Needs Emotional;Ritual;Other (Comment);Prayer (Pastoral Conversation/Support)  Stress Factors  Patient Stress Factors Not reviewed  Family Stress Factors Exhausted   Made my daily visit with Monique [name] and her family. The patient's husband, brother, father and mother-in-law were in the room at the time of my visit.  Per the patient's husband's request the previous day for regular prayer, I gave a prayer offering for the sick and anointed the patient's head with oil.  The family expressed their appreciation for the continued support and stated that it was a comfort.  Will continue to follow-up with the patient and her family.     [place] M.Div.

## 2015-09-25 NOTE — Progress Notes (Signed)
Daily Progress Note   Patient Name: Monique [name]       Date: 09/25/2015 DOB: 1976/01/03  Age: 40 y.o. MRN#: [id number] Attending Physician: Marshell Garfinkel, MD Primary Care Physician: No primary care provider on file. Admit Date: 08/25/2015  Reason for Consultation/Follow-up: Establishing goals of care  Subjective: Monique [name] is still mostly unresponsive. Husband, Gerald Stabs, is at bedside and says that she has not had a good day today and anticipates starting back CRRT tomorrow. He attributes her profound weakness to propofol she received for a few days earlier in her admission. They are still hopeful for improvement but frustrated with all her complications and decline - they seem to be searching for blame for her decline. Encouraged story telling and sharing of good memories today such as when Gerald Stabs first met her. Emotional support and therapeutic listening.    Length of Stay: 26 days  Current Medications: Scheduled Meds:  . antiseptic oral rinse  7 mL Mouth Rinse QID  . budesonide (PULMICORT) nebulizer solution  0.5 mg Nebulization BID  . calcitonin  300 Units Intramuscular Q12H  . cefTAZidime (FORTAZ)  IV  2 g Intravenous Q24H  . chlorhexidine  15 mL Mouth Rinse BID  . feeding supplement (NEPRO CARB STEADY)  1,000 mL Oral Q24H  . feeding supplement (PRO-STAT SUGAR FREE 64)  30 mL Per Tube BID  . levalbuterol  0.63 mg Nebulization Q6H  . pantoprazole sodium  40 mg Per Tube Daily  . sodium chloride  10-40 mL Intracatheter Q12H    Continuous Infusions:    PRN Meds: alteplase, artificial tears, fentaNYL (SUBLIMAZE) injection, fentaNYL (SUBLIMAZE) injection, heparin, heparin lock flush, heparin lock flush, hydrALAZINE, levalbuterol, loperamide, metoprolol, morphine injection,  [DISCONTINUED] ondansetron **OR** ondansetron (ZOFRAN) IV, polyvinyl alcohol, simethicone, sodium chloride, sodium chloride flush, sodium chloride flush  Physical Exam: Physical Exam  Constitutional: She appears well-developed and well-nourished. She appears lethargic. She is intubated.  HENT:  Head: Normocephalic.  Cardiovascular: Tachycardia present.   Pulmonary/Chest: No accessory muscle usage. Tachypnea noted. She is intubated. No respiratory distress.  Abdominal: Soft. Normal appearance.  Neurological: She appears lethargic.  Sedated on vent. Less responsive today.                 Vital Signs: BP 122/71 mmHg  Pulse 135  Temp(Src) 100.2 F (37.9  C) (Core (Comment))  Resp 22  Ht 5' 5.5" (1.664 m)  Wt 77.9 kg (171 lb 11.8 oz)  BMI 28.13 kg/m2  SpO2 93%  LMP 09/07/2015 SpO2: SpO2: 93 % O2 Device: O2 Device: Ventilator O2 Flow Rate: O2 Flow Rate (L/min): 40 L/min  Intake/output summary:   Intake/Output Summary (Last 24 hours) at 09/25/15 1529 Last data filed at 09/25/15 1500  Gross per 24 hour  Intake 2186.67 ml  Output    600 ml  Net 1586.67 ml   LBM: Last BM Date: 09/24/15 (rectal tube) Baseline Weight: Weight: 82.555 kg (182 lb) Most recent weight: Weight: 77.9 kg (171 lb 11.8 oz)       Palliative Assessment/Data: Flowsheet Rows        Most Recent Value   Intake Tab    Referral Department  Critical care   Unit at Time of Referral  ICU   Palliative Care Primary Diagnosis  Cancer   Date Notified  09/22/15   Palliative Care Type  New Palliative care   Reason for referral  Clarify Goals of Care   Date of Admission  08/31/2015   Date first seen by Palliative Care  09/23/15   # of days Palliative referral response time  1 Day(s)   # of days IP prior to Palliative referral  24   Clinical Assessment    Psychosocial & Spiritual Assessment    Palliative Care Outcomes       Additional Data Reviewed: CBC    Component Value Date/Time   WBC 14.8* 09/25/2015 0545     RBC 2.61* 09/25/2015 0545   HGB 7.2* 09/25/2015 0545   HCT 23.1* 09/25/2015 0545   PLT 27* 09/25/2015 0545   MCV 88.5 09/25/2015 0545   MCH 27.6 09/25/2015 0545   MCHC 31.2 09/25/2015 0545   RDW 16.5* 09/25/2015 0545   LYMPHSABS 2.2 09/25/2015 0545   MONOABS 1.8* 09/25/2015 0545   EOSABS 0.0 09/25/2015 0545   BASOSABS 0.3* 09/25/2015 0545    CMP     Component Value Date/Time   NA 137 09/25/2015 0545   K 4.5 09/25/2015 0545   CL 102 09/25/2015 0545   CO2 22 09/25/2015 0545   GLUCOSE 165* 09/25/2015 0545   BUN 114* 09/25/2015 0545   CREATININE 2.07* 09/25/2015 0545   CALCIUM 11.1* 09/25/2015 0545   PROT 6.4* 09/23/2015 1145   ALBUMIN 1.9* 09/25/2015 0545   AST 50* 09/23/2015 1145   ALT 74* 09/23/2015 1145   ALKPHOS 295* 09/23/2015 1145   BILITOT 0.8 09/23/2015 1145   GFRNONAA 29* 09/25/2015 0545   GFRAA 33* 09/25/2015 0545       Problem List:  Patient Active Problem List   Diagnosis Date Noted  . Palliative care encounter   . Neuromuscular weakness ([place])   . Muscular deconditioning   . Lung cancer ([place])   . Cancer ([place])   . Pressure ulcer 09/16/2015  . Ileus ([place])   . Small cell lung cancer ([place])   . Mass   . Acute respiratory failure ([place])   . Acute respiratory failure with hypoxemia ([place])   . AKI (acute kidney injury) ([place])   . Altered mental state   . Carcinoma of unknown primary ([place]) 09/10/2015  . Difficult intravenous access   . Metastatic cancer ([place])   . Adenopathy   . Hypoxia   . Uterine mass   . Acute respiratory failure with hypoxia ([place]) 08/30/2015  . Postobstructive pneumonia 08/30/2015  . Leukocytosis 08/30/2015  . Sepsis  due to pneumonia ([place]) 08/30/2015  . Lung mass 08/21/2015     Palliative Care Assessment & Plan    1.Code Status:  Full code - I have not addressed this    Code Status Orders        Start     Ordered   09/18/15 1818  Full code   Continuous     09/18/15 1817    Code Status History    Date Active Date  Inactive Code Status Order ID Comments User Context   09/18/2015  4:14 PM 09/18/2015  6:17 PM Partial Code [id number]  Juanito Doom, MD Inpatient   09/10/2015  3:40 PM 09/18/2015  4:14 PM DNR [id number]  Erick Colace, NP Inpatient   09/09/2015  9:04 PM 09/10/2015  3:40 PM Full Code [id number]  Rush Farmer, MD Inpatient   09/07/2015  4:12 PM 09/09/2015  9:04 PM Partial Code [id number]  Marshell Garfinkel, MD Inpatient   08/30/2015  2:58 PM 09/07/2015  4:12 PM Full Code [id number]  Toy Baker, MD Inpatient       2. Goals of Care/Additional Recommendations:  Continue full aggressive care. I have not tackled further discussion on this but I am going off what they have expressed to me. They are not allowing themselves to consider poor outcome although they do know the likely outcome. They would like for her to be extubated so she can speak with her son but not prepared for extubation with comfort measures.   Limitations on Scope of Treatment: Full Scope Treatment  Desire for further Chaplaincy support:yes  Psycho-social Needs: Caregiving  Support/Resources, Grief/Bereavement Support and [facility] Referral  3. Symptom Management:      1. Pain: Recommend fentanyl or dilaudid over morphine with renal failure.   4. Palliative Prophylaxis:   Frequent Pain Assessment, Oral Care and Turn Reposition  5. Prognosis: Extremely poor with new metastatic cancer with multiorgan failure limiting options for improvement and reversal of critical status. High risk for acute decompensation and even death at any time during hospitalization.   6. Discharge Planning:  To be determined on outcomes.    Thank you for allowing the Palliative Medicine Team to assist in the care of this patient.   Time In: 1420 Time Out: 1450 Total Time 32mn Prolonged Time Billed  no         APershing Proud NP  24/04/4495 3:29 PM  Please contact Palliative Medicine Team phone at [phone] questions and concerns.

## 2015-09-25 NOTE — Progress Notes (Signed)
Physical Therapy Discharge Patient Details Name: Monique [name] MRN: [id number] DOB: [birth year] Today's Date: 09/25/2015 Time:  -     Patient discharged from PT services secondary to medical decline - will need to re-order PT to resume therapy services. Have followed Echart notes for past few days. Patient is not medically stable  For  active PT/mobility  at this time. Per nursing, family performs PROM.   GP     [name], [name] PT [phone]   09/25/2015, 11:30 AM

## 2015-09-25 NOTE — Progress Notes (Addendum)
CRITICAL VALUE ALERT  Critical value received:  Positive blood cultures drawn 2/8, gram + cocci  Date of notification:  09/25/15  Time of notification:  3545  Critical value read back:Yes.    Nurse who received alert:  Rhina Brackett, RN  MD notified (1st page):  Noe Gens, NP  Time of first page:  1445  MD notified (2nd page):  Time of second page:  Responding MD:  Noe Gens, NP  Time MD responded:  [phone]

## 2015-09-25 NOTE — Progress Notes (Deleted)
[facility] Progress Note   Subjective: Ca up to 13-14 corrected yesterday, started to climb after CRRT stopped but was normal before starting on CRRT.  Started calcitonin/ pamidronate yest. Making urine 240 cc yest , first time. BP's good, no pressors. CXR no new chgs in IS pattern, CVP 10-11, wt up 78kg. Diarrhea resolved. WBC/ plts coming up.  Temp HD cath dc'd yesterday. BUN ^^ > creat increase.   Filed Vitals:   09/25/15 0600 09/25/15 0700 09/25/15 0800 09/25/15 0805  BP: 175/90 139/69 136/70 136/70  Pulse: 136 138 135 134  Temp: 99.7 F (37.6 C) 99.3 F (37.4 C) 99.9 F (37.7 C)   TempSrc:   Core (Comment)   Resp: '22 21 21 22  '$ Height:      Weight: 77.9 kg (171 lb 11.8 oz)     SpO2: 96% 94% 94% 94%    Inpatient medications: . antiseptic oral rinse  7 mL Mouth Rinse QID  . budesonide (PULMICORT) nebulizer solution  0.5 mg Nebulization BID  . calcitonin  300 Units Intramuscular Q12H  . cefTAZidime (FORTAZ)  IV  2 g Intravenous Q24H  . chlorhexidine  15 mL Mouth Rinse BID  . feeding supplement (NEPRO CARB STEADY)  1,000 mL Oral Q24H  . feeding supplement (PRO-STAT SUGAR FREE 64)  30 mL Per Tube BID  . fluconazole (DIFLUCAN) IV  200 mg Intravenous Q24H  . levalbuterol  0.63 mg Nebulization Q6H  . pantoprazole sodium  40 mg Per Tube Daily  . sodium chloride  10-40 mL Intracatheter Q12H     alteplase, artificial tears, fentaNYL (SUBLIMAZE) injection, fentaNYL (SUBLIMAZE) injection, heparin, heparin lock flush, heparin lock flush, hydrALAZINE, levalbuterol, loperamide, metoprolol, morphine injection, [DISCONTINUED] ondansetron **OR** ondansetron (ZOFRAN) IV, polyvinyl alcohol, simethicone, sodium chloride, sodium chloride flush, sodium chloride flush  Exam: On vent, somnolent, poorly responsive No jvd Chest coarse BS bilat RRR no mrg Abd soft mod distended no mass GU foley in place, dark clear urine in tube Diffuse 1+ edema Neuro as above     CXR 2/8 RLL  opacity and lung masses w no change, no gross CHF  Assessment: 1 Acute renal failure - oliguric, prob ATN. Off of CRRT 2 days. Making urine now which is new. Will hold RRT for now.  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 Calcitonin + pamidronate x 1.   Plan - as above. Would try to get off of IV vanc if possible, could substitute dapto if needed. Have d/w primary team.    Kelly Splinter MD [facility] pager [phone]    cell [phone] 09/25/2015, 9:15 AM    Recent Labs Lab 09/23/15 0425 09/24/15 1000 09/25/15 0545  NA 142 139 137  K 4.1 4.6 4.5  CL 104 104 102  CO2 '27 23 22  '$ GLUCOSE 153* 134* 165*  BUN 34* 82* 114*  CREATININE 0.65 1.51* 2.07*  CALCIUM 10.1 11.9* 11.1*  PHOS 1.9* 3.9 5.4*    Recent Labs Lab 09/23/15 1145 09/24/15 1000 09/25/15 0545  AST 50*  --   --   ALT 74*  --   --   ALKPHOS 295*  --   --   BILITOT 0.8  --   --  PROT 6.4*  --   --   ALBUMIN 2.1* 2.0* 1.9*    Recent Labs Lab 09/23/15 0425 09/24/15 0415 09/25/15 0545  WBC 1.6* 4.8 14.8*  NEUTROABS 0.8* 1.4* 10.5*  HGB 7.5* 7.4* 7.2*  HCT 24.2* 23.4* 23.1*  MCV 88.3 86.0 88.5  PLT 18* 15* 27*

## 2015-09-25 NOTE — Progress Notes (Signed)
Called to bedside to give neurological update to patients husband. 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 Counseled him that the MRI of the C spine showed bony metastasis but that those findings would not explain her profound weakness. Explained to him that the paraneoplastic antibodies are still pending.  Patient remains intubated and profoundly weak. At this time it is likely a multifactorial etiology of her weakness.Suspect there is likely a component of critical illness neuropathy coupled with overall deconditioning from a prolonged medical stay. Cannot rule out paraneoplastic syndrome contributing but based on her current medical condition would be hesitant to start empiric therapy with IVIG or PLEX without confirmation. Will continue to follow along as needed.   Jim Like, DO [facility] [phone]  If 7pm- 7am, please page neurology on call as listed in [place].

## 2015-09-25 NOTE — Progress Notes (Addendum)
Date: September 25, 2015 Chart reviewed for concurrent status and case management needs. Will continue to follow patient for changes and needs:  On full vent support, off crrt may have [facility]. Velva Harman, BSN, [place], [place]   [phone]

## 2015-09-25 NOTE — Progress Notes (Signed)
Noted new blood cx 1/2 +[facility]CC.  Patient still has Vancomycin on board (RVL this am =26).   Discussed patient and new information with Noe Gens, NP.   Plan as follows: F/U Random Vancomycin level in am When appropriate for another dose, will consider using less nephrotoxic agent such as Daptomycin vs re-dosing Vancomycin.  F/U final cx data- contaminant vs true infection?  Netta Cedars, PharmD, BCPS Pager: [phone] 09/25/2015'@2'$ :54 PM

## 2015-09-25 NOTE — Progress Notes (Signed)
CRITICAL VALUE ALERT  Critical value received:  Vanc 26  Date of notification:  09-25-15  Time of notification:  2563  Critical value read back:Yes.    Nurse who received alert:  Nevelyn Mellott D. Lukasz Rogus  MD notified (1st page):  ELINK  Time of first page:  06:00  MD notified (2nd page): N/A (Notified Pharmacy)  Time of second page: 06:05  Responding MD:  Pharmacy  Time MD responded:  06:05

## 2015-09-25 NOTE — Progress Notes (Signed)
RT NOTE:  Pt has maintained Sats around 90-91% during the last couple rounds. Per order maintain >92%. RT increased FiO2 to 60%. RN aware. RT will monitor.

## 2015-09-26 ENCOUNTER — Inpatient Hospital Stay (HOSPITAL_COMMUNITY): Payer: BC Managed Care – PPO

## 2015-09-26 LAB — FUNGUS CULTURE W SMEAR: FUNGAL SMEAR: NONE SEEN

## 2015-09-26 LAB — CBC WITH DIFFERENTIAL/PLATELET
Basophils Absolute: 0.3 10*3/uL — ABNORMAL HIGH (ref 0.0–0.1)
Basophils Relative: 1 %
EOS ABS: 0 10*3/uL (ref 0.0–0.7)
Eosinophils Relative: 0 %
HCT: 22.2 % — ABNORMAL LOW (ref 36.0–46.0)
HEMOGLOBIN: 7 g/dL — AB (ref 12.0–15.0)
LYMPHS ABS: 6.2 10*3/uL — AB (ref 0.7–4.0)
Lymphocytes Relative: 24 %
MCH: 27.8 pg (ref 26.0–34.0)
MCHC: 31.5 g/dL (ref 30.0–36.0)
MCV: 88.1 fL (ref 78.0–100.0)
MONO ABS: 1.5 10*3/uL — AB (ref 0.1–1.0)
Monocytes Relative: 6 %
NEUTROS ABS: 17.7 10*3/uL — AB (ref 1.7–7.7)
NEUTROS PCT: 69 %
Platelets: 42 10*3/uL — ABNORMAL LOW (ref 150–400)
RBC: 2.52 MIL/uL — AB (ref 3.87–5.11)
RDW: 16.5 % — AB (ref 11.5–15.5)
WBC: 25.7 10*3/uL — AB (ref 4.0–10.5)
nRBC: 2 /100 WBC — ABNORMAL HIGH

## 2015-09-26 LAB — RENAL FUNCTION PANEL
ANION GAP: 16 — AB (ref 5–15)
Albumin: 2 g/dL — ABNORMAL LOW (ref 3.5–5.0)
BUN: 149 mg/dL — ABNORMAL HIGH (ref 6–20)
CALCIUM: 11 mg/dL — AB (ref 8.9–10.3)
CO2: 21 mmol/L — AB (ref 22–32)
Chloride: 104 mmol/L (ref 101–111)
Creatinine, Ser: 2.4 mg/dL — ABNORMAL HIGH (ref 0.44–1.00)
GFR, EST AFRICAN AMERICAN: 28 mL/min — AB (ref >60)
GFR, EST NON AFRICAN AMERICAN: 24 mL/min — AB (ref >60)
Glucose, Bld: 146 mg/dL — ABNORMAL HIGH (ref 65–99)
Phosphorus: 5.7 mg/dL — ABNORMAL HIGH (ref 2.5–4.6)
Potassium: 4.8 mmol/L (ref 3.5–5.1)
SODIUM: 141 mmol/L (ref 135–145)

## 2015-09-26 LAB — BASIC METABOLIC PANEL
ANION GAP: 18 — AB (ref 5–15)
BUN: 153 mg/dL — AB (ref 6–20)
CALCIUM: 10.3 mg/dL (ref 8.9–10.3)
CO2: 21 mmol/L — ABNORMAL LOW (ref 22–32)
CREATININE: 2.73 mg/dL — AB (ref 0.44–1.00)
Chloride: 100 mmol/L — ABNORMAL LOW (ref 101–111)
GFR calc Af Amer: 24 mL/min — ABNORMAL LOW (ref >60)
GFR, EST NON AFRICAN AMERICAN: 21 mL/min — AB (ref >60)
GLUCOSE: 139 mg/dL — AB (ref 65–99)
Potassium: 4.4 mmol/L (ref 3.5–5.1)
Sodium: 139 mmol/L (ref 135–145)

## 2015-09-26 LAB — VANCOMYCIN, RANDOM: VANCOMYCIN RM: 21 ug/mL

## 2015-09-26 LAB — SODIUM, URINE, RANDOM

## 2015-09-26 LAB — CREATININE, URINE, RANDOM: CREATININE, URINE: 72.8 mg/dL

## 2015-09-26 NOTE — Progress Notes (Signed)
PCCM PROGRESS NOTE  ADMISSION DATE: 08/27/2015 CONSULT DATE: 08/30/2015 REFERRING PROVIDER: Dr. Roel Cluck, [facility]  BRIEF Hx:  40 yo female with no PMH admitted 1/13 with progressive dyspnea, wheezing. 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  S/p chemo -carbo + etoposide.  Failed extubation x 1.  Now with diffuse weakness ? CIN vs paraneoplastic - no cord compression on MRI.   SUBJECTIVE:  Defervesced, Tm 100.9 Poor UO  Remains on 60% Appears very weak    VITAL SIGNS: BP 125/69 mmHg  Pulse 132  Temp(Src) 100.4 F (38 C) (Core (Comment))  Resp 19  Ht 5' 5.5" (1.664 m)  Wt 173 lb 11.6 oz (78.8 kg)  BMI 28.46 kg/m2  SpO2 94%  LMP 08/28/2015  INTAKE/OUTPUT: I/O last 3 completed shifts: In: 2826.7 [I.V.:540; Other:80; NG/GT:1500; IV Piggyback:706.7] Out: 995 [Urine:445; Stool:550]  General: awake, chronic  critically ill  HENT: NCAT ETT in place PULM: non-labored on vent, lungs bilaterally coarse with crackles  CV: Tachy, regular GI: BS+, soft, nontender Derm: stage 2 with mild surrounding brown changes sacral area about 4-5 cm total Neuro: arouses to voice, follows simple commands but very weak  CBC Recent Labs     09/24/15  0415  09/25/15  0545  09/26/15  0305  WBC  4.8  14.8*  25.7*  HGB  7.4*  7.2*  7.0*  HCT  23.4*  23.1*  22.2*  PLT  15*  27*  42*    Coag's No results for input(s): APTT, INR in the last 72 hours.  BMET Recent Labs     09/25/15  0545  09/25/15  1826  09/26/15  0305  NA  137  138  141  K  4.5  4.5  4.8  CL  102  102  104  CO2  22  18*  21*  BUN  114*  135*  149*  CREATININE  2.07*  2.35*  2.40*  GLUCOSE  165*  138*  146*    Electrolytes Recent Labs     09/24/15  1000  09/25/15  0545  09/25/15  1826  09/26/15  0305  CALCIUM  11.9*  11.1*  10.9*  11.0*  PHOS  3.9  5.4*   --   5.7*    Sepsis Markers No results  for input(s): PROCALCITON, O2SATVEN in the last 72 hours.  Invalid input(s): LACTICACIDVEN  ABG No results for input(s): PHART, PCO2ART, PO2ART in the last 72 hours.  Liver Enzymes Recent Labs     09/23/15  1145  09/24/15  1000  09/25/15  0545  09/26/15  0305  AST  50*   --    --    --   ALT  74*   --    --    --   ALKPHOS  295*   --    --    --   BILITOT  0.8   --    --    --   ALBUMIN  2.1*  2.0*  1.9*  2.0*    Cardiac Enzymes No results for input(s): TROPONINI, PROBNP in the last 72 hours.  Glucose No results for input(s): GLUCAP in the last 72 hours.  Imaging Dg Chest Port 1 View  09/26/2015  CLINICAL DATA:  Respiratory failure. Endotracheal tube positioning. Metastatic disease in the lungs. EXAM: PORTABLE CHEST 1 VIEW COMPARISON:  09/24/2015 FINDINGS: Endotracheal tube tip is 3.2  cm above the carina. Nasogastric tube enters the stomach. Elevated right hemidiaphragm noted with blunting of the right costophrenic angle. Bilateral scattered pulmonary nodules are present superimposed on bilateral interstitial and patchy airspace opacities. When compared to 09/24/15 the appearance is essentially stable. IMPRESSION: 1. Stable appearance of bilateral metastatic pulmonary nodules with superimposed interstitial and patchy airspace opacities, elevated right hemidiaphragm, and likely moderate right pleural effusion. Support apparatus satisfactorily positioned. Electronically Signed   By: Van Clines M.D.   On: 09/26/2015 06:52   Dg Chest Port 1 View  09/24/2015  CLINICAL DATA:  Acute respiratory failure, history of metastatic disease EXAM: PORTABLE CHEST 1 VIEW COMPARISON:  09/23/2015 FINDINGS: Endotracheal tube, left-sided jugular central line and nasogastric catheter are again identified and stable. Patchy nodular changes are noted throughout both lungs consistent with the patient's given clinical history of metastatic disease. Right-sided pleural effusion is noted which is increased  slightly in the interval from the prior exam. No pneumothorax is seen. IMPRESSION: Changes consistent with known metastatic disease. Increasing right-sided pleural effusion. Tubes and lines as described. Electronically Signed   By: Inez Catalina M.D.   On: 09/24/2015 10:36    CULTURES: 1/13 Blood >> neg 1/14 Pneumococcal Ag >> negative 1/14 Legionella Ag >> negative 1/15 C diff PCR >> negative 1/22 BAL >> yeast candida only 5 k 1/21 BCx2  >> neg 2/04 resp culture > GPC pairs >>candida 2/04 blood culture > ng 2/04 urine culture >> yeast  2/8 blood >> 1/2 coag neg staph  ANTIBIOTICS: 1/13 Rocephin >> 1/13 1/13 Zithromax >> 1/13 1/14 Vancomycin >> 1/19, restart 1/21>>>off 1/14 Zosyn >> 1/20 1/21 Ceftaz >> off 2/03 unasyn >> 2/4  Vanc 2/4 >> 2/9 Ceftaz 2/4 >> Difulcan 2/7 >> 2/9  LINES/TUBES: 1/15 Rt PICC >> 1/24 Left IJ CVL 1/24 >> 2/9 HD cath 1/27 >> 2/8  STUDIES: 1/13 CT chest >> multiple b/l nodules, mass like consolidation Rt mid lung, Rt hilar and subcarinal mass, Lt hilar LAN, 2.7 cm Rt paratracheal LAN 1/14 CT abd/pelvis >> 10 cm mass superior to uterus 1/15 Tumor markers >> CA 19-9 453, CEA 3.9, CA 125 780.8 1/16 Bronchoscopy >> crush artifact ?small cell lung cancer (non diagnostic) 1/24 lower ext doppler > left DVT peroneal vein 2/04 CT chest abdomen pelvis >> Right lung mass smaller, surrounding consolidation noted, left lung nodules smaller, pelvic mass smaller 2/09 EEG >>    EVENTS: 1/13 Admit 1/15 Gyn oncology consulted 1/16 Bronchoscopy in ICU; Placed on BiPAP for wheezing, dyspnea 1/19 IR biopsy >> Poorly differentiated high Grade Carcinoma >stains favor Neuroendocrine. TTF stain was NEGATIVE 1/22 bronch cytology: neg  1/25 Etoposide/carboplatin cycle #1 1/28 EEG>>> diffuse slowly (performed while uremic) 1/31 weaning  2/01 gave fluid challenge and changed to even volume status.  2/02 passed SBT. Extubated. Re-intubated 2/03 again approached family re:  goals of care. They want full support 2/05 neurology consult  2/09 Off CVVHD, some UOP, rising BUN/CR, tmax 100    ASSESSMENT/PLAN:  PULMONARY A: Acute hypoxic respiratory failure due to lung cancer and profound neuromuscular weakness HCAP on 2/4 CT chest > clear left lung consolidation in lingula and left lower lobe P: PRVC 8 cc/kg as rest mode PSV wean as tolerated, limited by tachy -cannot extubate due to diffuse weakness Gen no role for tracheostomy here as she has incurable cancer with multi-organ failure - but may have to consider an exception here depending on goals  NEUROLOGY A: Cancer pain  Acute Encephalopathy - better after  HD, has been intermittently altered with hypercarbia   Profound neuromuscular weakness -  MRI c-spine with diffuse bone mets but no cord compression. 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 vs less likely auto-immune mediated from malignancy (Eaton-Lambert, myasthenia, etc) P: Await results of auto-antibodies sent by Neurology  Minimize sedation per family request  Morphine for pain PT as tolerated   CARDIAC A: Persistent Sinus Tachycardia  P: ICU monitoring    RENAL A: AKI - oliguric, has required CVVHD P: Appreciate Nephrology input  Nephrology feels if HD restarted, she will likely need CRRT (as of 2/9) - will replace femoral HD cath in anticipation Monitor BMET and UOP Replace electrolytes as needed Keep volume status net even to slightly positive with hopes of renal recovery (not likely to be extubated soon)   GASTROENTEROLOGY A: Severe Protein Calorie Malnutrition Loose Stool -  cdiff neg, resolved  Abdominal Distention  Pancreatic inflammation on CT but lipase normal P: Continue tube feeds Protonix for SUP Imodium   HEMATOLOGY/ONCOLOGY A: 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 Line sepsis vs tumor vs DVT.  ONC feels likely related to timing of chemo  P: Continue ceftaz until 2/11 D/C vancomycin 2/9 with renal fx, consider Dapto for GP coverage if needed Completed diflucan for oropharyngeal candidiasis Given line holiday  ENDOCRINE A: Hyperglycemia - mild, no hx of DM P: Monitor glucose on BMP    Family: updated bedside daily.  They remain hopeful despite her dismal prognosis.    Summary - Her profound neuromuscular weakness & advanced cancer are reasons for her respiratory failure.  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 Prognosis very poor.  Appreciate palliative care input. Need to establish goals -  Doubt that she will be able to leave the hospital.   The patient is critically ill with multiple organ systems failure and requires high complexity decision making for assessment and support, frequent evaluation and titration of therapies, application of advanced monitoring technologies and extensive interpretation of multiple databases. Critical Care Time devoted to patient care services described in this note independent of APP time is 32 minutes.   Kara Mead MD. Shade Flood. [facility] Pager [phone] If no response call [phone]      09/26/2015, 8:47 AM

## 2015-09-26 NOTE — Progress Notes (Signed)
IP PROGRESS NOTE  Subjective:   She remains less responsive. Multiple family members are at the bedside.  Objective: Vital signs in last 24 hours: Blood pressure 131/77, pulse 125, temperature 98.8 F (37.1 C), temperature source Core (Comment), resp. rate 20, height 5' 5.5" (1.664 m), weight 173 lb 11.6 oz (78.8 kg), last menstrual period 09/11/2015, SpO2 95 %.  Intake/Output from previous day: 02/09 0701 - 02/10 0700 In: 1415 [I.V.:165; NG/GT:1020; IV Piggyback:150] Out: 620 [Urine:270; Stool:350]  Physical Exam:  HEENT: ET tube in place Abdomen: Distended  Neurologic: Opens eyes, otherwise not responding  Lab Results:  Recent Labs  09/25/15 0545 09/26/15 0305  WBC 14.8* 25.7*  HGB 7.2* 7.0*  HCT 23.1* 22.2*  PLT 27* 42*   ANC 17.7   BMET  Recent Labs  09/25/15 1826 09/26/15 0305  NA 138 141  K 4.5 4.8  CL 102 104  CO2 18* 21*  GLUCOSE 138* 146*  BUN 135* 149*  CREATININE 2.35* 2.40*  CALCIUM 10.9* 11.0*    Studies/Results: Dg Chest Port 1 View  09/26/2015  CLINICAL DATA:  Respiratory failure. Endotracheal tube positioning. Metastatic disease in the lungs. EXAM: PORTABLE CHEST 1 VIEW COMPARISON:  09/24/2015 FINDINGS: Endotracheal tube tip is 3.2 cm above the carina. Nasogastric tube enters the stomach. Elevated right hemidiaphragm noted with blunting of the right costophrenic angle. Bilateral scattered pulmonary nodules are present superimposed on bilateral interstitial and patchy airspace opacities. When compared to 09/24/15 the appearance is essentially stable. IMPRESSION: 1. Stable appearance of bilateral metastatic pulmonary nodules with superimposed interstitial and patchy airspace opacities, elevated right hemidiaphragm, and likely moderate right pleural effusion. Support apparatus satisfactorily positioned. Electronically Signed   By: Van Clines M.D.   On: 09/26/2015 06:52    Medications: I have reviewed the patient's current  medications.  Assessment/Plan:  1. Metastatic carcinoma with a dominant right lung mass, bilateral lung masses, chest/abdominal lymphadenopathy, and a pelvic mass  Bronchoscopy 09/01/2015 revealed endobronchial lesions at the right upper lobe and right middle lobe, biopsies revealed malignant cells without a definitive diagnosis  CT-guided biopsy of a respiratory lymph node 09/04/2015 confirmed poorly differential carcinoma, with some features of a neuroendocrine carcinoma.  Cycle 1 etoposide/carboplatin 09/10/2015  CTs chest, abdomen, and pelvis on 09/19/2015-mild decrease in dominant right lower lobe mass, new cell adenopathy, and pulmonary masses. Slight enlargement of the pelvic mass.  MRI cervical spine 09/22/2015 with diffuse bone metastases, no epidural tumor  2. Respiratory failure secondary to #1 and pneumonia, and possibly motor weakness secondary to deconditioning or a paraneoplastic syndrome  3. Renal failure-now making some urine, off of CRRT  4. Left leg and right upper extremity DVTs 09/09/2015, treated with heparin, discontinued secondary to severe thrombocytopenia  5.  Altered mental status secondary to uremia, hypercalcemia, and respiratory failure  6.  Anemia secondary to phlebotomy, metastatic carcinoma, and renal failure, status post a red cell transfusion 09/15/2015  7.  Leukopenia/thrombocytopenia secondary to chemotherapy, improved  8.  Extremity weakness-most likely related to deconditioning/critical illness   9.  Fever, treated with Diflucan for treatment of Candida in the sputum, blood culture from 09/24/2015-positive for coag negative staph  10. Hypercalcemia-status post pamidronate 09/24/2015  Monique [name] is now at day 17 following a first cycle of etoposide/carboplatin. The white count has recovered and the platelets are again improved today.    She remains critically ill with multiorgan failure. I discussed the indication for further chemotherapy  with her family. Chemotherapy is not indicated at present with  the persistent thrombocytopenia and renal failure. It is unclear whether the first treatment with chemotherapy benefited her. I agree with palliative care consultation.  I discussed the situation with her family at the bedside.  Recommendations: 1. Continue management of respiratory failure per critical care medicine 2. Consider resuming CRRT or hemodialysis per nephrology     LOS: 27 days   Monique [name]  09/26/2015, 2:16 PM

## 2015-09-26 NOTE — Progress Notes (Signed)
Pharmacy Antibiotic Note  Monique [name] is a 40 y.o. female admitted on 08/27/2015. Pharmacy has been consulted for vancomycin and ceftazidime dosing for HCAP.  Vancomycin stopped on 2/5, but patient now spiking fevers so pharmacy asked to resume Vancomycin dosing.  2/7 CXR shows persistent RLL infiltrate.  Urine culture and trach aspirate growing moderate yeast and RN notes white colored tongue.  CCM thinks pt likely colonized but will treat for oral candidiasis x 3 days.  CRRT has been stopped, however minimal UOP.  Antibiotics doses will be monitored/adjusted as needed.    2/10:  Tm24h: 100.9  WBC increased (s/p neupogen x 5 doses)  Minimal UOP and SCr elevated.  CRRT may be restarted today.   1 of 2 Blood cultures now growing CoNS - ?significance.  True infection vs contaminant?  Pt currently on line holiday  Checking random vancomycin levels to adjust doses as CRRT now off.    Random Vancomycin level this AM = 21 mcg/ml (goal <20)   Plan:  Continue Ceftazidime 2g IV q24h (7/7) - no stop date in place yet  No further Vancomycin needed today.  F/U random Vanc level in am.  If CRRT will be restarted, antibiotic doses need to be adjusted.  Follow up cultures and sensitivities, renal function  Height: 5' 5.5" (166.4 cm) Weight: 173 lb 11.6 oz (78.8 kg) IBW/kg (Calculated) : 58.15  Temp (24hrs), Avg:99.9 F (37.7 C), Min:99.1 F (37.3 C), Max:100.9 F (38.3 C)   Recent Labs Lab 09/22/15 0400  09/23/15 0425 09/24/15 0415 09/24/15 1000 09/25/15 0545 09/25/15 1826 09/26/15 0305  WBC 1.0*  --  1.6* 4.8  --  14.8*  --  25.7*  CREATININE 0.59  < > 0.65  --  1.51* 2.07* 2.35* 2.40*  VANCORANDOM  --   --   --   --   --  26  --  21  < > = values in this interval not displayed.  Estimated Creatinine Clearance: 32.7 mL/min (by C-G formula based on Cr of 2.4).    No Known Allergies  Antimicrobials this admission: 1/13 >> Rocephin >> 1/13 1/13 >> Zithromax >>  1/13 1/14 >> Vanc >> 1/19, 1/21 >> Vanc >> 1/25 1/14 >> Zosyn >> 1/20 1/21 >> Fortaz >> 1/31 1/28 >> Diflucan >> 1/31 2/2 >>Unasyn >> 2/4 2/4 >> Vanc >> 2/5; 2/8>> 2/4 >> Ceftaz >> 2/7 >> Fluconazole x 3 (2/9)  Dose adjustments this admission: 1/15 1900 VT: 15 on 1g IV q8h 1/18 1130 VT: 15 on 1g IV q8h 1/24 1500 VT: 36 on '750mg'$  IV q8h = hold Vanc. Decrease ceftazidime to 1g IV q12h.  1/25 0500 Vrandom: 28. Ke ~0.017, T1/2 ~41hrs. Vanc dc'd by CCM. 2/9: 0500 Vrandom 26 after Vanc 1gm x1 at 1051 on 2/8 2/10: 0500 Vrandom = 21 - no further doses, recheck in AM  Microbiology results: 1/14 HIV screen: neg 1/14 S. pneumo UAg: neg 1/14 Legionella UAg: neg 1/14 MRSA PCR: neg 1/15 Cdiff PCR: neg 1/13 blood x2: NGF 1/21 Trach asp: moderate yeast c/w candida, final(insignificant per CCM) 1/21 blood x2: NGF 1/22 bronch washings: 5K colonies/ml, Candida, final(insignificant per CCM) 2/4 BCx: NGTD 2/4 UCx: >100K yeast 2/4 Trach asp: (gram stain: rare GPC in pairs) Final results: Moderate yeast consistent with candida 2/8 1 of 2 Blood cultures: CoNS -? Contaminant vs true infection  Thank you for allowing pharmacy to be a part of this patient's care.  Ralene Bathe, PharmD, BCPS 09/26/2015, 8:34 AM  Pager:  [phone]  

## 2015-09-26 NOTE — Progress Notes (Signed)
[facility] Progress Note   Subjective: Cr up slightly to 2.40 today, was 2.35 last night. UOP 250 cc/ 24hrs.  Ca corrected unchanged at 12.6. WBC ^25k, plts ^42k. Fevers seem to be coming down.  WBC rising significantly  Filed Vitals:   09/26/15 1100 09/26/15 1125 09/26/15 1200 09/26/15 1300  BP: 133/78 133/78 142/86 131/77  Pulse: 132 128 129 125  Temp: 99.1 F (37.3 C)  99 F (37.2 C) 98.8 F (37.1 C)  TempSrc:   Core (Comment)   Resp: '21 17 23 20  '$ Height:      Weight:      SpO2: 94% 94% 95% 95%    Inpatient medications: . antiseptic oral rinse  7 mL Mouth Rinse QID  . budesonide (PULMICORT) nebulizer solution  0.5 mg Nebulization BID  . calcitonin  300 Units Intramuscular Q12H  . cefTAZidime (FORTAZ)  IV  2 g Intravenous Q24H  . chlorhexidine  15 mL Mouth Rinse BID  . feeding supplement (NEPRO CARB STEADY)  1,000 mL Oral Q24H  . feeding supplement (PRO-STAT SUGAR FREE 64)  30 mL Per Tube BID  . levalbuterol  0.63 mg Nebulization Q6H  . pantoprazole sodium  40 mg Per Tube Daily  . sodium chloride  10-40 mL Intracatheter Q12H     alteplase, artificial tears, fentaNYL (SUBLIMAZE) injection, fentaNYL (SUBLIMAZE) injection, heparin, heparin lock flush, heparin lock flush, hydrALAZINE, levalbuterol, loperamide, metoprolol, morphine injection, [DISCONTINUED] ondansetron **OR** ondansetron (ZOFRAN) IV, polyvinyl alcohol, simethicone, sodium chloride, sodium chloride flush, sodium chloride flush  Exam: On vent, somnolent, poorly responsive No jvd Chest coarse BS bilat RRR no mrg Abd soft mod distended no mass GU foley in place, dark clear urine in tube Diffuse 2+ edema x 4 ext Neuro as above     CXR 2/10 no change nodular lung mass and RLL opacity, no gross CHF  Assessment: 1 Acute renal failure - oliguric, prob ATN. Off of CRRT 3 days. Vol / edema worse , FiO2 ^0.60 since stopping. CXR no gross edema.  MS poor the last 2 days.  Fevers declining some. Ca no  change (adjCa 12.6). D/W primary team, they will place temp cath in case HD / CRRT required this weekend.  2 Volume - edema worsening 3 Anemia transfusing prn 4 Metastatic cancer, neuroendocrine type- s/p chemo x 1 on 1/25 w carbo/etoposide 5 Resp failure on vent 6 Leukopenia s/p chemo, better 7 ID - per primary 8 Hypercalcemia - new issue, on calcitonin and sp pamidronate x 1. Levels elevated still but better.   Plan - check labs this evening. May need return to CRRT soon.  Cont calcitonin/ bisphosphonates.    Kelly Splinter MD [facility] pager [phone]    cell [phone] 09/26/2015, 2:47 PM    Recent Labs Lab 09/24/15 1000 09/25/15 0545 09/25/15 1826 09/26/15 0305  NA 139 137 138 141  K 4.6 4.5 4.5 4.8  CL 104 102 102 104  CO2 23 22 18* 21*  GLUCOSE 134* 165* 138* 146*  BUN 82* 114* 135* 149*  CREATININE 1.51* 2.07* 2.35* 2.40*  CALCIUM 11.9* 11.1* 10.9* 11.0*  PHOS 3.9 5.4*  --  5.7*    Recent Labs Lab 09/23/15 1145 09/24/15 1000 09/25/15 0545 09/26/15 0305  AST 50*  --   --   --   ALT 74*  --   --   --   ALKPHOS 295*  --   --   --   BILITOT 0.8  --   --   --  PROT 6.4*  --   --   --   ALBUMIN 2.1* 2.0* 1.9* 2.0*    Recent Labs Lab 09/24/15 0415 09/25/15 0545 09/26/15 0305  WBC 4.8 14.8* 25.7*  NEUTROABS 1.4* 10.5* 17.7*  HGB 7.4* 7.2* 7.0*  HCT 23.4* 23.1* 22.2*  MCV 86.0 88.5 88.1  PLT 15* 27* 42*

## 2015-09-26 NOTE — Procedures (Signed)
Hemodialysis Catheter Insertion Procedure Note Monique [name] [id number] Dec 05, 1975  Procedure: Insertion of Hemodialysis Catheter Indications: Hemodialysis  Procedure Details Consent: Risks of procedure as well as the alternatives and risks of each were explained to the (patient/caregiver).  Consent for procedure obtained.  Time Out: Verified patient identification, verified procedure, site/side was marked, verified correct patient position, special equipment/implants available, medications/allergies/relevent history reviewed, required imaging and test results available.  Performed  Maximum sterile technique was used including antiseptics, cap, gloves, gown, hand hygiene, mask and sheet.  Skin prep: Chlorhexidine; local anesthetic administered  A Trialysis HD catheter was placed in the right femoral vein due to clot in UE, L IJ TLC recently removed, & HD needs using the Seldinger technique.  Evaluation Blood flow good Complications: No apparent complications Patient did tolerate procedure well.   Procedure performed under direct supervision of Dr. Elsworth Soho and with ultrasound guidance for real time vessel cannulation.     Noe Gens, NP-C  Pulmonary & Critical Care Pgr: [phone] or [phone] 09/26/2015, 4:38 PM

## 2015-09-26 NOTE — Progress Notes (Signed)
Pt's brother and father were bedside when I arrived.  Pt's brother explained how pt's condition has changed (up and down) during the week. He said they cope according to her condition. He said it has been frustrating to [place] (pt's husband) bc he sees improvement and then it changes. Family still maintains hope and remains bedside to observe any changes or interaction w/pt. Please page if additional support is needed. Chaplain Ernest Haber, M.Div.   09/26/15 1800  Clinical Encounter Type  Visited With Family

## 2015-09-27 LAB — RENAL FUNCTION PANEL
ALBUMIN: 2.2 g/dL — AB (ref 3.5–5.0)
ANION GAP: 18 — AB (ref 5–15)
ANION GAP: 17 — AB (ref 5–15)
Albumin: 2.1 g/dL — ABNORMAL LOW (ref 3.5–5.0)
BUN: 169 mg/dL — ABNORMAL HIGH (ref 6–20)
BUN: 134 mg/dL — ABNORMAL HIGH (ref 6–20)
CALCIUM: 9.3 mg/dL (ref 8.9–10.3)
CO2: 19 mmol/L — AB (ref 22–32)
CO2: 21 mmol/L — ABNORMAL LOW (ref 22–32)
Calcium: 9.8 mg/dL (ref 8.9–10.3)
Chloride: 101 mmol/L (ref 101–111)
Chloride: 101 mmol/L (ref 101–111)
Creatinine, Ser: 2.97 mg/dL — ABNORMAL HIGH (ref 0.44–1.00)
Creatinine, Ser: 2.34 mg/dL — ABNORMAL HIGH (ref 0.44–1.00)
GFR calc Af Amer: 22 mL/min — ABNORMAL LOW (ref >60)
GFR, EST AFRICAN AMERICAN: 29 mL/min — AB (ref >60)
GFR, EST NON AFRICAN AMERICAN: 19 mL/min — AB (ref >60)
GFR, EST NON AFRICAN AMERICAN: 25 mL/min — AB (ref >60)
GLUCOSE: 177 mg/dL — AB (ref 65–99)
Glucose, Bld: 156 mg/dL — ABNORMAL HIGH (ref 65–99)
PHOSPHORUS: 4.3 mg/dL (ref 2.5–4.6)
PHOSPHORUS: 2.8 mg/dL (ref 2.5–4.6)
POTASSIUM: 4.3 mmol/L (ref 3.5–5.1)
Potassium: 4.1 mmol/L (ref 3.5–5.1)
SODIUM: 139 mmol/L (ref 135–145)
Sodium: 138 mmol/L (ref 135–145)

## 2015-09-27 LAB — CULTURE, BLOOD (ROUTINE X 2)

## 2015-09-27 LAB — MAGNESIUM: Magnesium: 2.9 mg/dL — ABNORMAL HIGH (ref 1.7–2.4)

## 2015-09-27 LAB — CBC WITH DIFFERENTIAL/PLATELET
BLASTS: 0 %
Band Neutrophils: 0 %
Basophils Absolute: 0 10*3/uL (ref 0.0–0.1)
Basophils Relative: 0 %
EOS PCT: 0 %
Eosinophils Absolute: 0 10*3/uL (ref 0.0–0.7)
HEMATOCRIT: 20.3 % — AB (ref 36.0–46.0)
Hemoglobin: 6.4 g/dL — CL (ref 12.0–15.0)
LYMPHS ABS: 8.1 10*3/uL — AB (ref 0.7–4.0)
LYMPHS PCT: 25 %
MCH: 27.6 pg (ref 26.0–34.0)
MCHC: 31.5 g/dL (ref 30.0–36.0)
MCV: 87.5 fL (ref 78.0–100.0)
MONO ABS: 12 10*3/uL — AB (ref 0.1–1.0)
MONOS PCT: 37 %
Metamyelocytes Relative: 0 %
Myelocytes: 0 %
NRBC: 0 /100{WBCs}
Neutro Abs: 12.4 10*3/uL — ABNORMAL HIGH (ref 1.7–7.7)
Neutrophils Relative %: 38 %
Platelets: 61 10*3/uL — ABNORMAL LOW (ref 150–400)
Promyelocytes Absolute: 0 %
RBC: 2.32 MIL/uL — AB (ref 3.87–5.11)
RDW: 16.7 % — AB (ref 11.5–15.5)
WBC: 32.5 10*3/uL — AB (ref 4.0–10.5)

## 2015-09-27 MED ORDER — ACETAMINOPHEN 160 MG/5ML PO SOLN: 650.0000 mg | Freq: Four times a day (QID) | ORAL | Status: DC | PRN

## 2015-09-27 MED ORDER — SODIUM CHLORIDE 0.9 % FOR CRRT
INTRAVENOUS_CENTRAL | Status: DC | PRN
  Filled 2015-09-27: qty 1000

## 2015-09-27 MED ORDER — PRISMASOL BGK 4/2.5 32-4-2.5 MEQ/L IV SOLN
INTRAVENOUS | Status: DC
  Administered 2015-09-27 – 2015-09-30 (×23): via INTRAVENOUS_CENTRAL
  Filled 2015-09-27 (×30): qty 5000

## 2015-09-27 MED ORDER — PRISMASOL BGK 4/2.5 32-4-2.5 MEQ/L IV SOLN
INTRAVENOUS | Status: DC
  Administered 2015-09-27 – 2015-09-29 (×3): via INTRAVENOUS_CENTRAL
  Filled 2015-09-27 (×4): qty 5000

## 2015-09-27 MED ORDER — PRISMASOL BGK 4/2.5 32-4-2.5 MEQ/L IV SOLN
INTRAVENOUS | Status: DC
  Administered 2015-09-27 – 2015-09-30 (×6): via INTRAVENOUS_CENTRAL
  Filled 2015-09-27 (×7): qty 5000

## 2015-09-27 MED ORDER — FENTANYL CITRATE (PF) 100 MCG/2ML IJ SOLN
25.0000 ug | INTRAMUSCULAR | Status: DC | PRN
  Filled 2015-09-27: qty 2

## 2015-09-27 MED ORDER — ALTEPLASE 2 MG IJ SOLR
2.0000 mg | Freq: Once | INTRAMUSCULAR | Status: DC | PRN
  Filled 2015-09-27: qty 2

## 2015-09-27 MED ORDER — HEPARIN SODIUM (PORCINE) 1000 UNIT/ML DIALYSIS
1000.0000 [IU] | INTRAMUSCULAR | Status: DC | PRN
  Filled 2015-09-27: qty 6

## 2015-09-27 NOTE — Progress Notes (Addendum)
[facility] Progress Note   Subjective: Calcium down today. BUN/ Cr up further and UOP poor 210 cc. Plts 61^, wbc 32^^. Hb 6.4. K ok. BP 130/80, no pressors. CXR yest no change bilat nodular infiltrates/ RLL consolidation  Filed Vitals:   09/27/15 0400 09/27/15 0500 09/27/15 0600 09/27/15 0757  BP: 110/62 114/56 145/73 133/70  Pulse: 131 130 119 134  Temp: 99.3 F (37.4 C) 99.5 F (37.5 C) 99.5 F (37.5 C)   TempSrc:      Resp: '24 25 21 29  '$ Height:      Weight:  79.2 kg (174 lb 9.7 oz)    SpO2: 94% 94% 92% 94%    Inpatient medications: . antiseptic oral rinse  7 mL Mouth Rinse QID  . calcitonin  300 Units Intramuscular Q12H  . chlorhexidine  15 mL Mouth Rinse BID  . feeding supplement (NEPRO CARB STEADY)  1,000 mL Oral Q24H  . feeding supplement (PRO-STAT SUGAR FREE 64)  30 mL Per Tube BID  . levalbuterol  0.63 mg Nebulization Q6H  . pantoprazole sodium  40 mg Per Tube Daily  . sodium chloride  10-40 mL Intracatheter Q12H   . dialysis replacement fluid (prismasate)    . dialysis replacement fluid (prismasate)    . dialysate (PRISMASATE)     alteplase, alteplase, artificial tears, fentaNYL (SUBLIMAZE) injection, heparin, heparin, heparin lock flush, heparin lock flush, hydrALAZINE, levalbuterol, loperamide, metoprolol, [DISCONTINUED] ondansetron **OR** ondansetron (ZOFRAN) IV, polyvinyl alcohol, simethicone, sodium chloride, sodium chloride, sodium chloride flush, sodium chloride flush  Exam: On vent, somnolent, poorly responsive No jvd Chest coarse BS bilat and wheezing RRR no mrg Abd soft mod distended no mass GU foley in place, dark clear urine in tube Diffuse 2+ edema x 4 ext Neuro as above     CXR 2/10 no change nodular lung mass and RLL opacity, no gross CHF  Assessment: 1 Acute renal failure - oliguric/ ATN. Making little and azotemia worsening. Plan resume CRRT w new cath. 2 Volume - edema worsening 3 Anemia transfusing prn 4 Metastatic  cancer, neuroendocrine type- s/p chemo x 1 on 1/25 w carbo/etoposide 5 Resp failure on vent 6 Leukopenia s/p chemo, better 7 ID - completed 7d course IV abx.  Afeb, WBC up 8 Hypercalcemia - prob due to cancer/ bony invasion. On calcitonin and sp pamidronate x 1. Improving today 9 Thrombocytopenia - improving  Plan - resume CRRT, get vol down. Cont calcitonin for now, dc soon if Ca continued to drop.     Kelly Splinter MD [facility] pager [phone]    cell [phone] 09/27/2015, 8:22 AM    Recent Labs Lab 09/25/15 0545  09/26/15 0305 09/26/15 1700 09/27/15 0539  NA 137  < > 141 139 138  K 4.5  < > 4.8 4.4 4.3  CL 102  < > 104 100* 101  CO2 22  < > 21* 21* 19*  GLUCOSE 165*  < > 146* 139* 177*  BUN 114*  < > 149* 153* 169*  CREATININE 2.07*  < > 2.40* 2.73* 2.97*  CALCIUM 11.1*  < > 11.0* 10.3 9.8  PHOS 5.4*  --  5.7*  --  4.3  < > = values in this interval not displayed.  Recent Labs Lab 09/23/15 1145  09/25/15 0545 09/26/15 0305 09/27/15 0539  AST 50*  --   --   --   --   ALT 74*  --   --   --   --  ALKPHOS 295*  --   --   --   --   BILITOT 0.8  --   --   --   --   PROT 6.4*  --   --   --   --   ALBUMIN 2.1*  < > 1.9* 2.0* 2.1*  < > = values in this interval not displayed.  Recent Labs Lab 09/25/15 0545 09/26/15 0305 09/27/15 0539  WBC 14.8* 25.7* 32.5*  NEUTROABS 10.5* 17.7* 12.4*  HGB 7.2* 7.0* 6.4*  HCT 23.1* 22.2* 20.3*  MCV 88.5 88.1 87.5  PLT 27* 42* 61*

## 2015-09-27 NOTE — Progress Notes (Signed)
PCCM PROGRESS NOTE  ADMISSION DATE: 08/28/2015 CONSULT DATE: 08/30/2015 REFERRING PROVIDER: Dr. Roel Cluck, [facility]  BRIEF Hx:  40 yo female with no PMH admitted 1/13 with progressive dyspnea, wheezing. 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  S/p chemo -carbo + etoposide.  Failed extubation x 1.  Now with diffuse weakness ? CIN vs paraneoplastic - no cord compression on MRI.   MICROBIOLOGY: Blood Ctx x2 2/8:  1/2 Coag Neg Staph Trach Asp Ctx 2/4:  Candida Blood Ctx x2 2/4:  Negative Urine Ctx 2/4:  Yeast  BAL 1/22:  Candida Blood Ctx x2 1/21:  Negative  Trach Asp 1/21:  Candida Blood Ctx x2 1/13:  Negative HIV 1/14:  Negative Urine Strep Ag 1/14:  Negative Urine Legionella Ag 1/14:  Negative C diff PCR 1/15:  Negative  ANTIBIOTICS: Ceftaz 2/4 - 2/9 Unasyn 2/3 - 2/4 Vanc 2/4 - 2/9 Difulcan 2/7 - 2/9 Rocephin 1/13 Zithromax 1/13 Vancomycin 1/14 - 1/19, restart 1/21>>>off Zosyn 1/14 - 1/20 Ceftaz 1/21 >> off  LINES/TUBES: R Fem Temp HD CVL 2/10>> OETT 8.0 2/2>> Foley 2/7>> OGT 2/2>> Rectal Tube>> PIV x1 Rt PICC 1/15 - 1/24 Left IJ CVL 1/24 - 2/9 HD cath 1/27 - 2/8  STUDIES: 1/13 CT chest >> multiple b/l nodules, mass like consolidation Rt mid lung, Rt hilar and subcarinal mass, Lt hilar LAN, 2.7 cm Rt paratracheal LAN 1/14 CT abd/pelvis >> 10 cm mass superior to uterus 1/15 Tumor markers >> CA 19-9 453, CEA 3.9, CA 125 780.8 1/16 Bronchoscopy >> crush artifact ?small cell lung cancer (non diagnostic) 1/24 lower ext doppler > left DVT peroneal vein 2/04 CT chest abdomen pelvis >> Right lung mass smaller, surrounding consolidation noted, left lung nodules smaller, pelvic mass smaller 2/09 EEG >>   EVENTS: 1/13 Admit 1/15 Gyn oncology consulted 1/16 Bronchoscopy in ICU; Placed on BiPAP for wheezing, dyspnea 1/19 IR biopsy >> Poorly differentiated high  Grade Carcinoma >stains favor Neuroendocrine. TTF stain was NEGATIVE 1/22 bronch cytology: neg  1/25 Etoposide/carboplatin cycle #1 1/28 EEG>>> diffuse slowly (performed while uremic) 1/31 weaning  2/01 gave fluid challenge and changed to even volume status.  2/02 passed SBT. Extubated. Re-intubated 2/03 again approached family re: goals of care. They want full support 2/05 neurology consult  2/09 Off CVVHD, some UOP, rising BUN/CR, tmax 100  SUBJECTIVE:  Patient having low grade fever. Family refusing to allow nurse to treat patient's pain with IV medications.   REVIEW OF SYSTEMS:  Unable to obtain as patient is intubated.  VITAL SIGNS: BP 106/59 mmHg  Pulse 131  Temp(Src) 99.3 F (37.4 C) (Core (Comment))  Resp 24  Ht 5' 5.5" (1.664 m)  Wt 78.8 kg (173 lb 11.6 oz)  BMI 28.46 kg/m2  SpO2 92%  LMP 09/14/2015  INTAKE/OUTPUT: I/O last 3 completed shifts: In: 2220 [I.V.:180; Other:180; NG/GT:1710; IV Piggyback:150] Out: 740 [Urine:390; Stool:350]  General: Eyes semi-open. No distress. Husband sleeping in chair at bedside.  HENT: OETT in place. No scleral injection. PULM: Diminished breath sounds bilaterally. Symmetric chest rise on ventilator. CV: Regular rhythm. Unable to appreciate JVD. GI: Soft. Bowel sounds present.  Integument:  Warm & dry. No rash on exposed skin. Neuro: Not following commands. Opens eyes spontaneously. Doesn't track.  CBC Recent Labs     09/25/15  0545  09/26/15  0305  WBC  14.8*  25.7*  HGB  7.2*  7.0*  HCT  23.1*  22.2*  PLT  27*  42*    Coag's No results for input(s): APTT, INR in the last 72 hours.  BMET Recent Labs     09/25/15  1826  09/26/15  0305  09/26/15  1700  NA  138  141  139  K  4.5  4.8  4.4  CL  102  104  100*  CO2  18*  21*  21*  BUN  135*  149*  153*  CREATININE  2.35*  2.40*  2.73*  GLUCOSE  138*  146*  139*    Electrolytes Recent Labs     09/24/15  1000  09/25/15  0545  09/25/15  1826  09/26/15  0305   09/26/15  1700  CALCIUM  11.9*  11.1*  10.9*  11.0*  10.3  PHOS  3.9  5.4*   --   5.7*   --     Sepsis Markers No results for input(s): PROCALCITON, O2SATVEN in the last 72 hours.  Invalid input(s): LACTICACIDVEN  ABG No results for input(s): PHART, PCO2ART, PO2ART in the last 72 hours.  Liver Enzymes Recent Labs     09/24/15  1000  09/25/15  0545  09/26/15  0305  ALBUMIN  2.0*  1.9*  2.0*    Cardiac Enzymes No results for input(s): TROPONINI, PROBNP in the last 72 hours.  Glucose No results for input(s): GLUCAP in the last 72 hours.  Imaging Dg Chest Port 1 View  09/26/2015  CLINICAL DATA:  Respiratory failure. Endotracheal tube positioning. Metastatic disease in the lungs. EXAM: PORTABLE CHEST 1 VIEW COMPARISON:  09/24/2015 FINDINGS: Endotracheal tube tip is 3.2 cm above the carina. Nasogastric tube enters the stomach. Elevated right hemidiaphragm noted with blunting of the right costophrenic angle. Bilateral scattered pulmonary nodules are present superimposed on bilateral interstitial and patchy airspace opacities. When compared to 09/24/15 the appearance is essentially stable. IMPRESSION: 1. Stable appearance of bilateral metastatic pulmonary nodules with superimposed interstitial and patchy airspace opacities, elevated right hemidiaphragm, and likely moderate right pleural effusion. Support apparatus satisfactorily positioned. Electronically Signed   By: Van Clines M.D.   On: 09/26/2015 06:52    ASSESSMENT/PLAN:  PULMONARY A: Acute Hypoxic Respiratory Failure - Multifactorial from lung cancer & neuromuscular weakness. HCAP Lingula & LLL Lung - Seen on 2/4 CT. S/P BAL.  P: PSV wean as tolerated PRVC prn for rest mode Xopenex nebs q6hr Questionable role of tracheostomy given dismal prognosis  NEUROLOGY A: Pain - Secondary to Cancer. Acute Encephalopathy - Improved after HD. Intermittent delirium due to hypercarbia. Profound Neuromuscular Weakness - MRI  C-spin w/ bone mets but w/o cord compression. 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  P: Sedation minimized at family's request Fentanyl IV prn PT as tolerated  CARDIAC A: Sinus Tachycardia  P: Continue telemetry monitoring  RENAL A: Acute Renal Failure - Oliguric. Restarted on CVVHD.  P: Nephrology following CVVHD per recommendations Trending BUN/Creatinine daily for renal function Monitoring UOP with Foley Daily electrolyte panel   GASTROENTEROLOGY A: Severe Protein Calorie Malnutrition Loose Stool - Resolved. C diff negative. Pancreatic Inflammation on CT - Lipase normal.  P: Tube Feedings Protonix via tube daily Protonix for SUP Imodium prn   HEMATOLOGY/ONCOLOGY A: Poorly Differentiated High Grade Carcinoma - Advanced & treating as Small Cell, stains favor Neuroendocrine, chemotherapy completed on 1/25 x1 dose Anemia - No signs of active bleeding. Transfused 1/30. DVT - left peroneal vein and Right UE extending into the Subclavian, PICC removed.  2/6 Heparin stopped due to thrombocytopenia. Pancytopenia - Secondary to chemotherapy.  P: Oncology following Neupogen per oncology Holding on transfusion pending repeat Hgb SCDs  INFECTION A: LLL & Lingula HCAP - Seen on CT. FUO 2/8 - Line sepsis vs Tumor vs DVT. Thought to be secondary to timing of chemo. Oral Candidiasis - Completed Dilfucan. Possible Fungal UTI - Treated w/ Diflucan as well & foley replaced ~2/7.  P: Fortaz finished 2/9 Finished line holiday. Monitoring for fever  ENDOCRINE A: Hyperglycemia - Mild. No h/o DM.  P: Monitor glucose on BMP daily   Family: Husband updated at bedside by JN on 2/11.  TODAY'S SUMMARY: 40 year old female with neuroendocrine cancer. I had a lengthy discussion about goals of care with her husband. He feels the she developed a critical illness polyneuropathy within 4 days of mechanical ventilation. He also reports that within 24 hours of  dialysis her mental status will improve. When asked why he was not allowing the nursing staff to treat her pain he reports it causes her to respond less to him. When I asked him why she wasn't responding today he blamed lack of hemodialysis. Unwilling to accept her dismal prognosis. Discussed the need for tracheostomy which he reports his wife would be willing to undergo reporting it would help her to better communicate with [place]. I educated him that we are not administering any sedation and it would change nothing other than prevent any retropharyngeal damage from her endotracheal tube. Allowing 24 hours to see what happens with patient's mental status before asking Hospital Ethics to become involved.  I have spent a total of 34 minutes of critical care time today caring for the patient, reviewing the patient's electronic medical record and discussing the plan of care with her husband at bedside.  Sonia Baller Ashok Cordia, M.D. [facility] Pager:  [phone] After 3pm or if no response, call [phone]  09/27/2015, 5:09 AM

## 2015-09-27 NOTE — Progress Notes (Signed)
IP PROGRESS NOTE  Subjective:   She remains less responsive. Her husband is at the bedside.  Objective: Vital signs in last 24 hours: Blood pressure 122/69, pulse 133, temperature 100.4 F (38 C), temperature source Core (Comment), resp. rate 31, height 5' 5.5" (1.664 m), weight 174 lb 9.7 oz (79.2 kg), last menstrual period 08/24/2015, SpO2 95 %.  Intake/Output from previous day: 02/10 0701 - 02/11 0700 In: 1455 [I.V.:15; NG/GT:1170; IV Piggyback:50] Out: 360 [Urine:210; Stool:150]  Physical Exam:  HEENT: ET tube in place Abdomen: Distended  Neurologic: Unresponsive  Lab Results:  Recent Labs  09/26/15 0305 09/27/15 0539  WBC 25.7* 32.5*  HGB 7.0* 6.4*  HCT 22.2* 20.3*  PLT 42* 61*   ANC 12.4   BMET  Recent Labs  09/26/15 1700 09/27/15 0539  NA 139 138  K 4.4 4.3  CL 100* 101  CO2 21* 19*  GLUCOSE 139* 177*  BUN 153* 169*  CREATININE 2.73* 2.97*  CALCIUM 10.3 9.8    Studies/Results: Dg Chest Port 1 View  09/26/2015  CLINICAL DATA:  Respiratory failure. Endotracheal tube positioning. Metastatic disease in the lungs. EXAM: PORTABLE CHEST 1 VIEW COMPARISON:  09/24/2015 FINDINGS: Endotracheal tube tip is 3.2 cm above the carina. Nasogastric tube enters the stomach. Elevated right hemidiaphragm noted with blunting of the right costophrenic angle. Bilateral scattered pulmonary nodules are present superimposed on bilateral interstitial and patchy airspace opacities. When compared to 09/24/15 the appearance is essentially stable. IMPRESSION: 1. Stable appearance of bilateral metastatic pulmonary nodules with superimposed interstitial and patchy airspace opacities, elevated right hemidiaphragm, and likely moderate right pleural effusion. Support apparatus satisfactorily positioned. Electronically Signed   By: Van Clines M.D.   On: 09/26/2015 06:52    Medications: I have reviewed the patient's current medications.  Assessment/Plan:  1. Metastatic carcinoma  with a dominant right lung mass, bilateral lung masses, chest/abdominal lymphadenopathy, and a pelvic mass  Bronchoscopy 09/01/2015 revealed endobronchial lesions at the right upper lobe and right middle lobe, biopsies revealed malignant cells without a definitive diagnosis  CT-guided biopsy of a respiratory lymph node 09/04/2015 confirmed poorly differential carcinoma, with some features of a neuroendocrine carcinoma.  Cycle 1 etoposide/carboplatin 09/10/2015  CTs chest, abdomen, and pelvis on 09/19/2015-mild decrease in dominant right lower lobe mass, new cell adenopathy, and pulmonary masses. Slight enlargement of the pelvic mass.  MRI cervical spine 09/22/2015 with diffuse bone metastases, no epidural tumor  2. Respiratory failure secondary to #1 and pneumonia, and possibly motor weakness secondary to deconditioning or a paraneoplastic syndrome  3. Renal failure-now making some urine, off of CRRT  4. Left leg and right upper extremity DVTs 09/09/2015, treated with heparin, discontinued secondary to severe thrombocytopenia  5.  Altered mental status secondary to uremia, hypercalcemia, and respiratory failure  6.  Anemia secondary to phlebotomy, metastatic carcinoma, and renal failure, status post a red cell transfusion 09/15/2015  7.  Leukopenia/thrombocytopenia secondary to chemotherapy, improved  8.  Extremity weakness-most likely related to deconditioning/critical illness   9.  Fever, treated with Diflucan for treatment of Candida in the sputum, blood culture from 09/24/2015-positive for coag negative staph  10. Hypercalcemia-status post pamidronate 09/24/2015  Monique [name] is now at day 18 following a first cycle of etoposide/carboplatin. The white count has recovered and the platelets are again improved today.    She remains intubated with multiorgan failure. She is unresponsive. The plan is to resume hemodialysis today. Hopefully her mental status will improve with  correction of the uremia. The hypercalcemia  has improved following pamidronate. I discussed the case with Dr. Jonnie Finner. We will discontinue calcitonin.    Recommendations: 1. Continue management of respiratory failure per critical care medicine 2. Resume hemodialysis per nephrology 3. Discontinue calcitonin, follow calcium level     LOS: 28 days   Consepcion [name]  09/27/2015, 11:10 AM

## 2015-09-28 ENCOUNTER — Inpatient Hospital Stay (HOSPITAL_COMMUNITY): Payer: BC Managed Care – PPO

## 2015-09-28 DIAGNOSIS — A419 Sepsis, unspecified organism: Principal | ICD-10-CM

## 2015-09-28 LAB — CBC WITH DIFFERENTIAL/PLATELET
BASOS ABS: 0 10*3/uL (ref 0.0–0.1)
BLASTS: 0 %
Band Neutrophils: 0 %
Basophils Relative: 0 %
Eosinophils Absolute: 0 10*3/uL (ref 0.0–0.7)
Eosinophils Relative: 0 %
HEMATOCRIT: 21.3 % — AB (ref 36.0–46.0)
HEMOGLOBIN: 6.8 g/dL — AB (ref 12.0–15.0)
LYMPHS PCT: 30 %
Lymphs Abs: 12.9 10*3/uL — ABNORMAL HIGH (ref 0.7–4.0)
MCH: 28.3 pg (ref 26.0–34.0)
MCHC: 31.9 g/dL (ref 30.0–36.0)
MCV: 88.8 fL (ref 78.0–100.0)
Metamyelocytes Relative: 0 %
Monocytes Absolute: 2.2 10*3/uL — ABNORMAL HIGH (ref 0.1–1.0)
Monocytes Relative: 5 %
Myelocytes: 0 %
NEUTROS ABS: 27.9 10*3/uL — AB (ref 1.7–7.7)
NEUTROS PCT: 65 %
NRBC: 0 /100{WBCs}
PROMYELOCYTES ABS: 0 %
Platelets: 79 10*3/uL — ABNORMAL LOW (ref 150–400)
RBC: 2.4 MIL/uL — ABNORMAL LOW (ref 3.87–5.11)
RDW: 17 % — ABNORMAL HIGH (ref 11.5–15.5)
WBC: 43 10*3/uL — AB (ref 4.0–10.5)

## 2015-09-28 LAB — RENAL FUNCTION PANEL
ALBUMIN: 2.3 g/dL — AB (ref 3.5–5.0)
ANION GAP: 14 (ref 5–15)
Albumin: 2.3 g/dL — ABNORMAL LOW (ref 3.5–5.0)
Anion gap: 14 (ref 5–15)
BUN: 69 mg/dL — ABNORMAL HIGH (ref 6–20)
BUN: 38 mg/dL — AB (ref 6–20)
CALCIUM: 8.7 mg/dL — AB (ref 8.9–10.3)
CALCIUM: 8.9 mg/dL (ref 8.9–10.3)
CO2: 24 mmol/L (ref 22–32)
CO2: 24 mmol/L (ref 22–32)
CREATININE: 0.98 mg/dL (ref 0.44–1.00)
Chloride: 101 mmol/L (ref 101–111)
Chloride: 102 mmol/L (ref 101–111)
Creatinine, Ser: 1.33 mg/dL — ABNORMAL HIGH (ref 0.44–1.00)
GFR calc Af Amer: >60 mL/min (ref >60)
GFR calc non Af Amer: 49 mL/min — ABNORMAL LOW (ref >60)
GFR calc non Af Amer: >60 mL/min (ref >60)
GFR, EST AFRICAN AMERICAN: 57 mL/min — AB (ref >60)
GLUCOSE: 195 mg/dL — AB (ref 65–99)
Glucose, Bld: 177 mg/dL — ABNORMAL HIGH (ref 65–99)
PHOSPHORUS: 1.8 mg/dL — AB (ref 2.5–4.6)
PHOSPHORUS: 1.9 mg/dL — AB (ref 2.5–4.6)
Potassium: 4 mmol/L (ref 3.5–5.1)
Potassium: 4.2 mmol/L (ref 3.5–5.1)
SODIUM: 139 mmol/L (ref 135–145)
SODIUM: 140 mmol/L (ref 135–145)

## 2015-09-28 LAB — APTT: APTT: 26 s (ref 24–37)

## 2015-09-28 LAB — BLOOD GAS, ARTERIAL
Acid-base deficit: 3.1 mmol/L — ABNORMAL HIGH (ref 0.0–2.0)
Bicarbonate: 23.2 mEq/L (ref 20.0–24.0)
DRAWN BY: 235321
FIO2: 0.7
MECHVT: 350 mL
O2 Saturation: 90.4 %
PCO2 ART: 53.3 mmHg — AB (ref 35.0–45.0)
PEEP: 5 cmH2O
PO2 ART: 66.8 mmHg — AB (ref 80.0–100.0)
Patient temperature: 36.8
RATE: 14 resp/min
TCO2: 23.1 mmol/L (ref 0–100)
pH, Arterial: 7.261 — ABNORMAL LOW (ref 7.350–7.450)

## 2015-09-28 LAB — PREPARE RBC (CROSSMATCH)

## 2015-09-28 LAB — MAGNESIUM: Magnesium: 2.7 mg/dL — ABNORMAL HIGH (ref 1.7–2.4)

## 2015-09-28 LAB — HEMOGLOBIN AND HEMATOCRIT, BLOOD
HCT: 25.2 % — ABNORMAL LOW (ref 36.0–46.0)
Hemoglobin: 8 g/dL — ABNORMAL LOW (ref 12.0–15.0)

## 2015-09-28 MED ORDER — SODIUM CHLORIDE 0.9 % IV SOLN
1.0000 g | Freq: Two times a day (BID) | INTRAVENOUS | Status: DC
  Administered 2015-09-28 – 2015-09-30 (×5): 1 g via INTRAVENOUS
  Filled 2015-09-28 (×6): qty 1

## 2015-09-28 MED ORDER — VANCOMYCIN HCL IN DEXTROSE 750-5 MG/150ML-% IV SOLN
750.0000 mg | Freq: Two times a day (BID) | INTRAVENOUS | Status: DC
  Filled 2015-09-28: qty 150

## 2015-09-28 MED ORDER — VANCOMYCIN HCL 10 G IV SOLR
1250.0000 mg | Freq: Once | INTRAVENOUS | Status: AC
  Administered 2015-09-28: 1250 mg via INTRAVENOUS
  Filled 2015-09-28: qty 1250

## 2015-09-28 MED ORDER — SODIUM CHLORIDE 0.9 % IV SOLN
0.0000 ug/min | INTRAVENOUS | Status: DC
  Administered 2015-09-28: 2 ug/min via INTRAVENOUS
  Administered 2015-09-29: 25 ug/min via INTRAVENOUS
  Administered 2015-09-30: 35 ug/min via INTRAVENOUS
  Administered 2015-09-30: 65 ug/min via INTRAVENOUS
  Filled 2015-09-28 (×6): qty 16

## 2015-09-28 MED ORDER — SODIUM CHLORIDE 0.9 % IV SOLN
Freq: Once | INTRAVENOUS | Status: AC
  Administered 2015-09-28: 12:00:00 via INTRAVENOUS

## 2015-09-28 MED ORDER — SODIUM CHLORIDE 0.9 % IV SOLN
6.0000 mg/kg | INTRAVENOUS | Status: DC
  Filled 2015-09-28: qty 9.6

## 2015-09-28 NOTE — Progress Notes (Addendum)
[facility] Progress Note   Subjective: B/Cr down w resuming CRRT.  NOt waking up today.  Wt up 80kg  Filed Vitals:   09/28/15 1415 09/28/15 1430 09/28/15 1445 09/28/15 1500  BP: 110/53 101/51 122/57 118/55  Pulse: 127   123  Temp: 99 F (37.2 C)   98.6 F (37 C)  TempSrc:    Core (Comment)  Resp: 32   26  Height:      Weight:      SpO2: 95%   92%    Inpatient medications: . antiseptic oral rinse  7 mL Mouth Rinse QID  . chlorhexidine  15 mL Mouth Rinse BID  . feeding supplement (NEPRO CARB STEADY)  1,000 mL Oral Q24H  . feeding supplement (PRO-STAT SUGAR FREE 64)  30 mL Per Tube BID  . levalbuterol  0.63 mg Nebulization Q6H  . meropenem (MERREM) IV  1 g Intravenous Q12H  . pantoprazole sodium  40 mg Per Tube Daily  . sodium chloride  10-40 mL Intracatheter Q12H  . vancomycin  750 mg Intravenous Q12H   . norepinephrine (LEVOPHED) Adult infusion 5 mcg/min (09/28/15 1500)  . dialysis replacement fluid (prismasate) 400 mL/hr at 09/28/15 1408  . dialysis replacement fluid (prismasate) 200 mL/hr at 09/28/15 1254  . dialysate (PRISMASATE) 2,000 mL/hr at 09/28/15 1421   acetaminophen (TYLENOL) oral liquid 160 mg/5 mL, alteplase, alteplase, artificial tears, fentaNYL (SUBLIMAZE) injection, heparin, heparin, heparin lock flush, heparin lock flush, hydrALAZINE, levalbuterol, loperamide, metoprolol, [DISCONTINUED] ondansetron **OR** ondansetron (ZOFRAN) IV, polyvinyl alcohol, simethicone, sodium chloride, sodium chloride, sodium chloride flush, sodium chloride flush  Exam: On vent, somnolent, poorly responsive No jvd Chest coarse BS bilat and wheezing RRR no mrg Abd soft mod distended no mass GU foley in place, dark clear urine in tube Diffuse 2+ edema x 4 ext Neuro as above     CXR 2/10 no change nodular lung mass and RLL opacity, no gross CHF  Assessment: 1 Acute renal failure - oliguric/ ATN. D#2 resumption of CRRT. Tolerating well. Not waking up.  2 Vol  excess - FiO2 up and wt's rising, prob pulm edema. Will increase UF goal, get vol down.  3 Anemia transfusing prn 4 Metastatic cancer, neuroendocrine type- s/p chemo x 1 on 1/25 w carbo/etoposide 5 Resp failure on vent 6 ID - completed 7d course IV abx.  Abx restarted yesterday with ^^WBC/ low grade fevers, vanc/ Merrem.  7 Hypercalcemia - resolved sp calcitonin and pamidronate. Bony mets likely cause 8 Thrombocytopenia - d/t chemo, resolving 9 AMS - no better  Plan - cont CRRT. ^UF 100-125 cc/ hr. DC vanc, substitute dapto.    Kelly Splinter MD [facility] pager [phone]    cell [phone] 09/28/2015, 3:37 PM    Recent Labs Lab 09/27/15 0539 09/27/15 1430 09/28/15 0442  NA 138 139 139  K 4.3 4.1 4.0  CL 101 101 101  CO2 19* 21* 24  GLUCOSE 177* 156* 177*  BUN 169* 134* 69*  CREATININE 2.97* 2.34* 1.33*  CALCIUM 9.8 9.3 8.7*  PHOS 4.3 2.8 1.8*    Recent Labs Lab 09/23/15 1145  09/27/15 0539 09/27/15 1430 09/28/15 0442  AST 50*  --   --   --   --   ALT 74*  --   --   --   --   ALKPHOS 295*  --   --   --   --   BILITOT 0.8  --   --   --   --  PROT 6.4*  --   --   --   --   ALBUMIN 2.1*  < > 2.1* 2.2* 2.3*  < > = values in this interval not displayed.  Recent Labs Lab 09/26/15 0305 09/27/15 0539 09/28/15 0442  WBC 25.7* 32.5* 43.0*  NEUTROABS 17.7* 12.4* 27.9*  HGB 7.0* 6.4* 6.8*  HCT 22.2* 20.3* 21.3*  MCV 88.1 87.5 88.8  PLT 42* 61* 79*

## 2015-09-28 NOTE — Progress Notes (Signed)
PCCM PROGRESS NOTE  ADMISSION DATE: 08/23/2015 CONSULT DATE: 08/30/2015 REFERRING PROVIDER: Dr. Roel Cluck, [facility]  BRIEF Hx:  40 yo female with no PMH admitted 1/13 with progressive dyspnea, wheezing. 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  S/p chemo -carbo + etoposide.  Failed extubation x 1.  Now with diffuse weakness ? CIN vs paraneoplastic - no cord compression on MRI.   MICROBIOLOGY: Tracheal Asp Ctx 2/12>>> Blood Ctx x2 2/8:  1/2 Coag Neg Staph Trach Asp Ctx 2/4:  Candida Blood Ctx x2 2/4:  Negative Urine Ctx 2/4:  Yeast  BAL 1/22:  Candida Blood Ctx x2 1/21:  Negative  Trach Asp 1/21:  Candida Blood Ctx x2 1/13:  Negative HIV 1/14:  Negative Urine Strep Ag 1/14:  Negative Urine Legionella Ag 1/14:  Negative C diff PCR 1/15:  Negative  ANTIBIOTICS: Ceftaz 2/4 - 2/11 Unasyn 2/3 - 2/4 Vanc 2/4 - 2/9 Difulcan 2/7 - 2/9 Rocephin 1/13 Zithromax 1/13 Vancomycin 1/14 - 1/19, restart 1/21>>>off Zosyn 1/14 - 1/20 Ceftaz 1/21 >> off  LINES/TUBES: R Fem Temp HD CVL 2/10>> OETT 8.0 2/2>> Foley 2/7>> OGT 2/2>> Rectal Tube>> PIV x1 Rt PICC 1/15 - 1/24 Left IJ CVL 1/24 - 2/9 HD cath 1/27 - 2/8  STUDIES: CT chest 1/13: multiple b/l nodules, mass like consolidation Rt mid lung, Rt hilar and subcarinal mass, Lt hilar LAN, 2.7 cm Rt paratracheal LAN CT abd/pelvis 1/14: 10 cm mass superior to uterus Tumor Markers 1/15: CA 19-9 453, CEA 3.9, CA 125 780.8 Bronchoscopy 1/16: crush artifact ?small cell lung cancer (non diagnostic) lower ext doppler 1/24: left DVT peroneal vein EEG 1/28:  Generalized nonspecific dysfunction/encephalopathy. No seizure activity. CT chest abdomen pelvis 2/4: Right lung mass smaller, surrounding consolidation noted, left lung nodules smaller, pelvic mass smaller Port CXR 2/12:  ETT in good position. No change in bilateral nodules & RLL  opacification.  EVENTS: 1/13 Admit 1/15 Gyn oncology consulted 1/16 Bronchoscopy in ICU; Placed on BiPAP for wheezing, dyspnea 1/19 IR biopsy >> Poorly differentiated high Grade Carcinoma >stains favor Neuroendocrine. TTF stain was NEGATIVE 1/22 bronch cytology: neg  1/25 Etoposide/carboplatin cycle #1 1/28 EEG>>> diffuse slowly (performed while uremic) 1/31 weaning  2/01 gave fluid challenge and changed to even volume status.  2/02 passed SBT. Extubated. Re-intubated 2/03 again approached family re: goals of care. They want full support 2/05 neurology consult  2/09 Off CVVHD, some UOP, rising BUN/CR, tmax 100 2/11 Restarted CVVHD due to worsening volume status & renal function 2/12 Increasing ETT secretions & peak pressures  SUBJECTIVE:  Increased secretions overnight with high peak pressures. TV decreased to 6cc/kg IBW & Tracheal Aspirate culture sent. Portable CXR without any change. Patient more febrile today and now borderline hypotensive.  REVIEW OF SYSTEMS:  Unable to obtain as patient is intubated.  VITAL SIGNS: BP 121/61 mmHg  Pulse 113  Temp(Src) 98.4 F (36.9 C) (Core (Comment))  Resp 32  Ht 5' 5.5" (1.664 m)  Wt 79.2 kg (174 lb 9.7 oz)  BMI 28.60 kg/m2  SpO2 90%  LMP 08/21/2015  INTAKE/OUTPUT: I/O last 3 completed shifts: In: 2055 [I.V.:15; Other:340; NG/GT:1650; IV Piggyback:50] Out: 073 [Urine:264; Other:397; Stool:150]  General: Eyes closed. Husband, father, and friend at bedside. No distress. HENT: OETT in place. No scleral injection. PULM: Coarse breath sounds bilaterally. Symmetric chest rise on ventilator. CV: Regular rhythm. Unable to appreciate JVD. Anasarca. GI: Soft. Bowel sounds  present. Protuberant. Integument:  Warm & dry. No rash on exposed skin. Neuro: Not following commands. Eyes mostly closed. No response to voice. No withdrawal to pain in all 4 extremities.  CBC Recent Labs     09/25/15  0545  09/26/15  0305  09/27/15  0539  WBC   14.8*  25.7*  32.5*  HGB  7.2*  7.0*  6.4*  HCT  23.1*  22.2*  20.3*  PLT  27*  42*  61*    Coag's No results for input(s): APTT, INR in the last 72 hours.  BMET Recent Labs     09/26/15  1700  09/27/15  0539  09/27/15  1430  NA  139  138  139  K  4.4  4.3  4.1  CL  100*  101  101  CO2  21*  19*  21*  BUN  153*  169*  134*  CREATININE  2.73*  2.97*  2.34*  GLUCOSE  139*  177*  156*    Electrolytes Recent Labs     09/26/15  0305  09/26/15  1700  09/27/15  0539  09/27/15  1430  CALCIUM  11.0*  10.3  9.8  9.3  MG   --    --   2.9*   --   PHOS  5.7*   --   4.3  2.8    Sepsis Markers No results for input(s): PROCALCITON, O2SATVEN in the last 72 hours.  Invalid input(s): LACTICACIDVEN  ABG No results for input(s): PHART, PCO2ART, PO2ART in the last 72 hours.  Liver Enzymes Recent Labs     09/26/15  0305  09/27/15  0539  09/27/15  1430  ALBUMIN  2.0*  2.1*  2.2*    Cardiac Enzymes No results for input(s): TROPONINI, PROBNP in the last 72 hours.  Glucose No results for input(s): GLUCAP in the last 72 hours.  Imaging Dg Chest Port 1 View  09/26/2015  CLINICAL DATA:  Respiratory failure. Endotracheal tube positioning. Metastatic disease in the lungs. EXAM: PORTABLE CHEST 1 VIEW COMPARISON:  09/24/2015 FINDINGS: Endotracheal tube tip is 3.2 cm above the carina. Nasogastric tube enters the stomach. Elevated right hemidiaphragm noted with blunting of the right costophrenic angle. Bilateral scattered pulmonary nodules are present superimposed on bilateral interstitial and patchy airspace opacities. When compared to 09/24/15 the appearance is essentially stable. IMPRESSION: 1. Stable appearance of bilateral metastatic pulmonary nodules with superimposed interstitial and patchy airspace opacities, elevated right hemidiaphragm, and likely moderate right pleural effusion. Support apparatus satisfactorily positioned. Electronically Signed   By: Van Clines M.D.   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 Transfused 1/30. DVT - left peroneal vein and Right UE extending into the Subclavian, PICC removed. 2/6 Heparin stopped due to thrombocytopenia. Pancytopenia - Secondary to chemotherapy.  P: Oncology following Neupogen per Oncology Transfusing 1u PRBC Hgb/Hct post transfusion SCDs  PULMONARY A: Acute Hypoxic Respiratory Failure - Multifactorial from lung cancer & neuromuscular weakness. HCAP Lingula & LLL Lung - Seen on 2/4 CT. S/P BAL.  P: Full Support w/ 6cc/kg IBW Xopenex nebs q6hr Chest PT via bed q4hr Questionable role of tracheostomy given dismal prognosis  NEUROLOGY A: Pain - Secondary to Cancer. Acute Encephalopathy - Improved after HD. Intermittent delirium due to hypercarbia. Profound Neuromuscular Weakness - MRI C-spin  w/ bone mets but w/o cord compression. 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  P: Sedation minimized at family's request Fentanyl IV prn but husband refusing for patient PT as tolerated Awaiting results of Paraneoplastic Profile (send-out) performed on 09/21/15  CARDIAC A: Sinus Tachycardia  P: Continue telemetry monitoring  RENAL A: Acute Renal Failure - Oliguric. Restarted on CVVHD 2/11.  P: Nephrology following CVVHD per recommendations Trending BUN/Creatinine daily for renal function Monitoring UOP with Foley Daily electrolyte panel   GASTROENTEROLOGY A: Severe Protein Calorie Malnutrition Loose Stool - Resolved. C diff negative. Pancreatic Inflammation on CT - Lipase normal.  P: Tube Feedings Protonix via tube daily Protonix for SUP Imodium prn   INFECTION A: LLL & Lingula HCAP - Seen on CT. FUO 2/8 - Line sepsis vs Tumor vs DVT. Thought to be secondary to  timing of chemo. Oral Candidiasis - Completed Dilfucan. Possible Fungal UTI - Treated w/ Diflucan as well & foley replaced ~2/7.  P: Tracheal Aspirate 2/12 pending from increased secretions Checking Blood & Urine cultures as well Empiric Vancomycin & Merrem Day #1 Fortaz finished 2/11 Finished line holiday. Monitoring for fever  ENDOCRINE A: Hyperglycemia - BG controlled. No h/o DM.  P: Monitor glucose on BMP daily   Family: Husband , father, & friend updated at bedside by JN on 2/12.  TODAY'S SUMMARY: 40 year old female with neuroendocrine cancer. Suspect patient is developing an occult infection. Empiric Merrem and vancomycin given prior antibiotic regimen. Awaiting culture results. Prognosis remains dismal. Levophed for vasopressor support to maintain mean arterial pressure.  I have spent a total of 39 minutes of critical care time today caring for the patient, reviewing the patient's electronic medical record and discussing the plan of care with her family and friend at bedside.  Sonia Baller Ashok Cordia, M.D. [facility] Pager:  [phone] After 3pm or if no response, call [phone]  09/28/2015, 4:11 AM

## 2015-09-28 NOTE — Progress Notes (Signed)
RN informed Dr Ashok Cordia of Hgb of 6.8; MD stated that we will hold off on a blood transfusion at this time. Will continue to monitor.

## 2015-09-28 NOTE — Progress Notes (Signed)
IP PROGRESS NOTE  Subjective:   She remains unresponsive. Her husband is at the bedside.  Objective: Vital signs in last 24 hours: Blood pressure 100/48, pulse 112, temperature 97 F (36.1 C), temperature source Core (Comment), resp. rate 31, height 5' 5.5" (1.664 m), weight 176 lb 5.9 oz (80 kg), last menstrual period 09/13/2015, SpO2 93 %.  Intake/Output from previous day: 02/11 0701 - 02/12 0700 In: 1270 [I.V.:10; NG/GT:1020] Out: 1837 [Urine:59]  Physical Exam:  HEENT: ET tube in place Abdomen: Distended  Neurologic: Unresponsive  Lab Results:  Recent Labs  09/27/15 0539 09/28/15 0442  WBC 32.5* 43.0*  HGB 6.4* 6.8*  HCT 20.3* 21.3*  PLT 61* 79*   ANC 27.9   BMET  Recent Labs  09/27/15 1430 09/28/15 0442  NA 139 139  K 4.1 4.0  CL 101 101  CO2 21* 24  GLUCOSE 156* 177*  BUN 134* 69*  CREATININE 2.34* 1.33*  CALCIUM 9.3 8.7*    Studies/Results: Dg Chest Port 1 View  09/28/2015  CLINICAL DATA:  Acute onset of headache.  Initial encounter. EXAM: PORTABLE CHEST 1 VIEW COMPARISON:  Chest radiograph from 09/26/2015 FINDINGS: The endotracheal tube is seen ending 3 cm above the carina. The patient's enteric tube is noted extending below the diaphragm. A small right pleural effusion is noted. The patient's metastatic lung cancer is again noted. Opacities appear somewhat worsened from the prior study, raising concern for superimposed pneumonia. No pneumothorax is seen. The cardiomediastinal silhouette is normal in size. No acute osseous abnormalities are identified. IMPRESSION: 1. Endotracheal tube seen ending 3 cm above the carina. 2. Metastatic lung cancer again noted bilaterally. Opacities appear somewhat worsened from the prior study, raising concern for superimposed pneumonia. Electronically Signed   By: Garald Balding M.D.   On: 09/28/2015 04:22    Medications: I have reviewed the patient's current medications.  Assessment/Plan:  1. Metastatic carcinoma  with a dominant right lung mass, bilateral lung masses, chest/abdominal lymphadenopathy, and a pelvic mass  Bronchoscopy 09/01/2015 revealed endobronchial lesions at the right upper lobe and right middle lobe, biopsies revealed malignant cells without a definitive diagnosis  CT-guided biopsy of a respiratory lymph node 09/04/2015 confirmed poorly differential carcinoma, with some features of a neuroendocrine carcinoma.  Cycle 1 etoposide/carboplatin 09/10/2015  CTs chest, abdomen, and pelvis on 09/19/2015-mild decrease in dominant right lower lobe mass, new cell adenopathy, and pulmonary masses. Slight enlargement of the pelvic mass.  MRI cervical spine 09/22/2015 with diffuse bone metastases, no epidural tumor  2. Respiratory failure secondary to #1 and pneumonia, and possibly motor weakness secondary to deconditioning or a paraneoplastic syndrome  3. Renal failure-now making some urine, off of CRRT  4. Left leg and right upper extremity DVTs 09/09/2015, treated with heparin, discontinued secondary to severe thrombocytopenia  5.  Altered mental status secondary to uremia, hypercalcemia, and respiratory failure  6.  Anemia secondary to phlebotomy, metastatic carcinoma, and renal failure, status post a red cell transfusion 09/15/2015  7.  Leukopenia/thrombocytopenia secondary to chemotherapy, improved, now with reactive leukocytosis  8.  Extremity weakness-most likely related to deconditioning/critical illness, paraneoplastic panel pending   9.  Fever, treated with Diflucan for treatment of Candida in the sputum, blood culture from 09/24/2015-positive for coag negative staph  10. Hypercalcemia-status post pamidronate 09/24/2015  Monique [name] is now at day 19 following a first cycle of etoposide/carboplatin. The white count has recovered and the platelets are again improved today.    She has respiratory failure, renal failure, and  remains unresponsive. Her prognosis is poor. Her family  has remained hopeful she can receive additional chemotherapy. I do not recommend further chemotherapy unless there is some improvement in her mental status and organ failure.         LOS: 29 days   [place]  09/28/2015, 8:29 AM

## 2015-09-28 NOTE — Progress Notes (Signed)
Daily Progress Note   Patient Name: Monique [name]       Date: 09/28/2015 DOB: 03-28-1976  Age: 40 y.o. MRN#: [id number] Attending Physician: Marshell Garfinkel, MD Primary Care Physician: No primary care provider on file. Admit Date: 09/11/2015  Reason for Consultation/Follow-up: Establishing goals of care  Subjective: Monique [name] is worse today. Unresponsive and vent settings more aggressive with FiO2 up to 70%. Worsening generalized edema. Monique [name], his mother, and Monique [name]'s father and brother are at bedside. Monique [name] is frustrated more today and did not like how the weekend doctor approached and spoke with him about her poor prognosis. I did explain that the team wants to be honest about their fears and medical opinion so that there are no secrets or surprises. He mentioned the discussion about multiorgan failure but says her lungs are failing because she has cancer in them - but she was fine until intubated for biopsy. He talks of her renal failure and says that her kidneys were fine until she was given sedation that put her into renal failure.   He remains hopeful that she will become more alert with CRRT - he says that last time this improved her mental status after ~36 hrs. He also mentions tracheostomy and expresses desire for this - I did say that she might be too unstable to even consider this now. He has also been doing his own research on PubMed and I think it would be beneficial for him to have a discussion with Dr. Benay Spice about what he is reading (I did question if the subjects in the study were as critically ill as Monique [name]). They are also questioning the pelvic mass and a friend in the room speaks of how she has always had irregular menstrual cycles and issues and question the treatment of her cancer  and primary source - again encouraged them to go to oncology as they can best answer these questions for them. They also believe it might be helpful for [place] to obtain gyn records which I will work on tomorrow for them. I remind them that she has been to sick and complicated that we haven't been able to provide any chemotherapy regardless of source unfortunately.   Extremely difficult situation. I am attempting to support family but they question more and more each day and are very frustrated  and exhausted. At this point there only goal is to maintain hope to keep Monique [name] alive and hope to have her be able to talk with her son at some point.   Length of Stay: 29 days  Current Medications: Scheduled Meds:  . antiseptic oral rinse  7 mL Mouth Rinse QID  . chlorhexidine  15 mL Mouth Rinse BID  . [START ON 09/29/2015] DAPTOmycin (CUBICIN)  IV  6 mg/kg Intravenous Q24H  . feeding supplement (NEPRO CARB STEADY)  1,000 mL Oral Q24H  . feeding supplement (PRO-STAT SUGAR FREE 64)  30 mL Per Tube BID  . levalbuterol  0.63 mg Nebulization Q6H  . meropenem (MERREM) IV  1 g Intravenous Q12H  . pantoprazole sodium  40 mg Per Tube Daily  . sodium chloride  10-40 mL Intracatheter Q12H    Continuous Infusions: . norepinephrine (LEVOPHED) Adult infusion 6 mcg/min (09/28/15 1700)  . dialysis replacement fluid (prismasate) 400 mL/hr at 09/28/15 1408  . dialysis replacement fluid (prismasate) 200 mL/hr at 09/28/15 1254  . dialysate (PRISMASATE) 1,400 mL/hr at 09/28/15 1534    PRN Meds: acetaminophen (TYLENOL) oral liquid 160 mg/5 mL, alteplase, alteplase, artificial tears, fentaNYL (SUBLIMAZE) injection, heparin, heparin, heparin lock flush, heparin lock flush, levalbuterol, loperamide, metoprolol, [DISCONTINUED] ondansetron **OR** ondansetron (ZOFRAN) IV, polyvinyl alcohol, simethicone, sodium chloride, sodium chloride, sodium chloride flush, sodium chloride flush  Physical Exam: Physical Exam  Constitutional:  She appears well-developed and well-nourished. She appears lethargic. She is intubated.  HENT:  Head: Normocephalic.  Cardiovascular: Tachycardia present.   Pulmonary/Chest: No accessory muscle usage. Tachypnea noted. She is intubated. No respiratory distress.  Abdominal: Soft. Normal appearance.  Neurological: She appears lethargic.  Sedated on vent. Less responsive today.                 Vital Signs: BP 114/59 mmHg  Pulse 131  Temp(Src) 98.4 F (36.9 C) (Core (Comment))  Resp 33  Ht 5' 5.5" (1.664 m)  Wt 80 kg (176 lb 5.9 oz)  BMI 28.89 kg/m2  SpO2 91%  LMP 09/16/2015 SpO2: SpO2: 91 % O2 Device: O2 Device: Ventilator O2 Flow Rate: O2 Flow Rate (L/min): 40 L/min  Intake/output summary:   Intake/Output Summary (Last 24 hours) at 09/28/15 1704 Last data filed at 09/28/15 1700  Gross per 24 hour  Intake 2014.16 ml  Output   2552 ml  Net -537.84 ml   LBM: Last BM Date: 09/27/15 Baseline Weight: Weight: 82.555 kg (182 lb) Most recent weight: Weight: 80 kg (176 lb 5.9 oz)       Palliative Assessment/Data: Flowsheet Rows        Most Recent Value   Intake Tab    Referral Department  Critical care   Unit at Time of Referral  ICU   Palliative Care Primary Diagnosis  Cancer   Date Notified  09/22/15   Palliative Care Type  New Palliative care   Reason for referral  Clarify Goals of Care   Date of Admission  09/03/2015   Date first seen by Palliative Care  09/23/15   # of days Palliative referral response time  1 Day(s)   # of days IP prior to Palliative referral  24   Clinical Assessment    Psychosocial & Spiritual Assessment    Palliative Care Outcomes       Additional Data Reviewed: CBC    Component Value Date/Time   WBC 43.0* 09/28/2015 0442   RBC 2.40* 09/28/2015 0442   HGB 6.8* 09/28/2015 6812  HCT 21.3* 09/28/2015 0442   PLT 79* 09/28/2015 0442   MCV 88.8 09/28/2015 0442   MCH 28.3 09/28/2015 0442   MCHC 31.9 09/28/2015 0442   RDW 17.0* 09/28/2015 0442    LYMPHSABS 12.9* 09/28/2015 0442   MONOABS 2.2* 09/28/2015 0442   EOSABS 0.0 09/28/2015 0442   BASOSABS 0.0 09/28/2015 0442    CMP     Component Value Date/Time   NA 139 09/28/2015 0442   K 4.0 09/28/2015 0442   CL 101 09/28/2015 0442   CO2 24 09/28/2015 0442   GLUCOSE 177* 09/28/2015 0442   BUN 69* 09/28/2015 0442   CREATININE 1.33* 09/28/2015 0442   CALCIUM 8.7* 09/28/2015 0442   PROT 6.4* 09/23/2015 1145   ALBUMIN 2.3* 09/28/2015 0442   AST 50* 09/23/2015 1145   ALT 74* 09/23/2015 1145   ALKPHOS 295* 09/23/2015 1145   BILITOT 0.8 09/23/2015 1145   GFRNONAA 49* 09/28/2015 0442   GFRAA 57* 09/28/2015 0442       Problem List:  Patient Active Problem List   Diagnosis Date Noted  . Palliative care encounter   . Neuromuscular weakness ([place])   . Muscular deconditioning   . Lung cancer ([place])   . Cancer ([place])   . Pressure ulcer 09/16/2015  . Ileus ([place])   . Small cell lung cancer ([place])   . Mass   . Acute respiratory failure ([place])   . Acute respiratory failure with hypoxemia ([place])   . AKI (acute kidney injury) ([place])   . Altered mental state   . Carcinoma of unknown primary ([place]) 09/10/2015  . Difficult intravenous access   . Metastatic cancer ([place])   . Adenopathy   . Hypoxia   . Uterine mass   . Acute respiratory failure with hypoxia ([place]) 08/30/2015  . Postobstructive pneumonia 08/30/2015  . Leukocytosis 08/30/2015  . Sepsis due to pneumonia ([place]) 08/30/2015  . Lung mass 09/16/2015     Palliative Care Assessment & Plan    1.Code Status:  Full code - I have not addressed this specifically although it has been addressed multiple times by other providers.     Code Status Orders        Start     Ordered   09/18/15 1818  Full code   Continuous     09/18/15 1817    Code Status History    Date Active Date Inactive Code Status Order ID Comments User Context   09/18/2015  4:14 PM 09/18/2015  6:17 PM Partial Code [id number]  Juanito Doom, MD Inpatient     09/10/2015  3:40 PM 09/18/2015  4:14 PM DNR [id number]  Erick Colace, NP Inpatient   09/09/2015  9:04 PM 09/10/2015  3:40 PM Full Code [id number]  Rush Farmer, MD Inpatient   09/07/2015  4:12 PM 09/09/2015  9:04 PM Partial Code [id number]  Marshell Garfinkel, MD Inpatient   08/30/2015  2:58 PM 09/07/2015  4:12 PM Full Code [id number]  Toy Baker, MD Inpatient       2. Goals of Care/Additional Recommendations:  Continue full aggressive care. They are not allowing themselves to consider poor outcome although they do know the likely outcome. They would like for her to be able to speak with her son but not prepared for extubation with comfort measures.   Limitations on Scope of Treatment: Full Scope Treatment  Desire for further Chaplaincy support:yes  Psycho-social Needs: Caregiving  Support/Resources, Grief/Bereavement Support and [facility] Referral  3. Symptom Management:  1. Pain: Recommend fentanyl or dilaudid over morphine with renal failure.   4. Palliative Prophylaxis:   Frequent Pain Assessment, Oral Care and Turn Reposition  5. Prognosis: Extremely poor with new metastatic cancer with multiorgan failure limiting options for improvement and reversal of critical status. High risk for acute decompensation and even death at any time during hospitalization.   6. Discharge Planning:  To be determined on outcomes.    Thank you for allowing the Palliative Medicine Team to assist in the care of this patient.   Time In: 1600 Time Out: 1700 Total Time 64mn Prolonged Time Billed  no         APershing Proud NP  25/75/0518 5:04 PM  Please contact Palliative Medicine Team phone at [phone] questions and concerns.

## 2015-09-28 NOTE — Progress Notes (Signed)
RN spoke with close family friend when no family present at bedside. RN and friend spoke about patient condition as well as family's understanding of patient condition. Per friend it appears that family is receiving different information from different MDs, yet they feel oncology is most hopeful and that is what family is holding onto. Per friend family's goal is for patient to be "sitting up, with no tubes/machines, and can hug and talk to [employer]".  RN asked if friend thought family would be accepting that that might no longer be a realistic goal, friend did not answer. Per friend patient would want to continue aggressive measures.   RN had conversation with family at bedside including husband. Husband and brother expressed frustrations with different doctors giving them different opinions about patient status and treatments.  Discussed with Shaune Leeks, Palliative Care NP.

## 2015-09-28 NOTE — Progress Notes (Signed)
Pt's husband was bedside talking w/pt when I arrived. He talked about her care and discussion w/dr and nurses. He remains hopeful that something will change, as it did before, and they she will become a little stronger. He said he just wants everything done that can possibly be done to allow her to be able to communicate, in some form, with them. He said their (40 yr old) son prays for her every night. He strongly feels she will get better following dialysis. He discussed concern about her change (becoming worse) after she was intubated. [place] listened empathically and encouraged him as appropriate. During prayer [place] observed tearfulness from husband for the first time. Please page if additional support is needed. Chaplain Ernest Haber, M.Div.   09/28/15 1800  Clinical Encounter Type  Visited With Family

## 2015-09-28 NOTE — Progress Notes (Signed)
eLink Physician-Brief Progress Note Patient Name: Monique [name] DOB: 11-26-1975 MRN: [id number]   Date of Service  09/28/2015  HPI/Events of Note  Nurse notified of foamy white secretions and high peak/Plateau pressures to the mid 40s.Patient currently on Xopenex nebs every 6 hours.Borderline hypotension preventing aggressive diuresis with CVVHD.  eICU Interventions  1. Stat portable chest x-ray 2. Stat tracheal aspirate and culture 3. Starting chest physiotherapy 4. Decreasing tidal volume to 6 mL/kg ideal body weight 5. ABG 30 minutes to 1 hour     Intervention Category Major Interventions: Other:  Tera Partridge 09/28/2015, 3:34 AM

## 2015-09-28 NOTE — Progress Notes (Signed)
Pharmacy Antibiotic Note  Monique [name] is a 40 y.o. female admitted on 09/13/2015 with sepsis.  Pharmacy has been consulted for Vancomycin, merropenem dosing.  Pharmacy has followed patient for multiple antibiotic dosing in past, please see prior notes for earlier antibiotics.    Plan: While on CVVHD: Vancomycin '1250mg'$  iv x1 then,  Vancomycin 750 IV every 12 hours.  Goal trough 15-20 mcg/mL. Meropenem 1gm iv q12hr  Height: 5' 5.5" (166.4 cm) Weight: 176 lb 5.9 oz (80 kg) IBW/kg (Calculated) : 58.15  Temp (24hrs), Avg:98.4 F (36.9 C), Min:96.6 F (35.9 C), Max:100.4 F (38 C)   Recent Labs Lab 09/24/15 0415  09/25/15 0545  09/26/15 0305 09/26/15 1700 09/27/15 0539 09/27/15 1430 09/28/15 0442  WBC 4.8  --  14.8*  --  25.7*  --  32.5*  --  43.0*  CREATININE  --   < > 2.07*  < > 2.40* 2.73* 2.97* 2.34* 1.33*  VANCORANDOM  --   --  26  --  21  --   --   --   --   < > = values in this interval not displayed.  Estimated Creatinine Clearance: 59.4 mL/min (by C-G formula based on Cr of 1.33).    No Known Allergies  New Antimicrobials: Meropenem 2/12 >>  Vancomycin 2/12 >>    Thank you for allowing pharmacy to be a part of this patient's care.  Nani Skillern Crowford 09/28/2015 12:43 PM

## 2015-09-29 ENCOUNTER — Other Ambulatory Visit: Payer: Self-pay | Admitting: Oncology

## 2015-09-29 DIAGNOSIS — R69 Illness, unspecified: Secondary | ICD-10-CM

## 2015-09-29 DIAGNOSIS — A419 Sepsis, unspecified organism: Secondary | ICD-10-CM | POA: Insufficient documentation

## 2015-09-29 DIAGNOSIS — IMO0002 Reserved for concepts with insufficient information to code with codable children: Secondary | ICD-10-CM | POA: Insufficient documentation

## 2015-09-29 DIAGNOSIS — R6521 Severe sepsis with septic shock: Secondary | ICD-10-CM

## 2015-09-29 LAB — CBC WITH DIFFERENTIAL/PLATELET
BASOS ABS: 0 10*3/uL (ref 0.0–0.1)
Band Neutrophils: 8 %
Basophils Relative: 0 %
Blasts: 2 %
Eosinophils Absolute: 0 10*3/uL (ref 0.0–0.7)
Eosinophils Relative: 0 %
HCT: 26.6 % — ABNORMAL LOW (ref 36.0–46.0)
HEMOGLOBIN: 8.3 g/dL — AB (ref 12.0–15.0)
LYMPHS ABS: 2.8 10*3/uL (ref 0.7–4.0)
Lymphocytes Relative: 5 %
MCH: 27.8 pg (ref 26.0–34.0)
MCHC: 31.2 g/dL (ref 30.0–36.0)
MCV: 89 fL (ref 78.0–100.0)
METAMYELOCYTES PCT: 5 %
MONO ABS: 4 10*3/uL — AB (ref 0.1–1.0)
MYELOCYTES: 14 %
Monocytes Relative: 7 %
NEUTROS PCT: 56 %
NRBC: 8 /100{WBCs} — AB
Neutro Abs: 48.7 10*3/uL — ABNORMAL HIGH (ref 1.7–7.7)
Other: 0 %
PLATELETS: 80 10*3/uL — AB (ref 150–400)
PROMYELOCYTES ABS: 3 %
RBC: 2.99 MIL/uL — AB (ref 3.87–5.11)
RDW: 16.8 % — ABNORMAL HIGH (ref 11.5–15.5)
WBC: 56.6 10*3/uL — AB (ref 4.0–10.5)

## 2015-09-29 LAB — BLOOD GAS, ARTERIAL
ACID-BASE DEFICIT: 6.4 mmol/L — AB (ref 0.0–2.0)
BICARBONATE: 22.8 meq/L (ref 20.0–24.0)
DRAWN BY: 276051
FIO2: 0.8
O2 SAT: 81.1 %
PEEP: 5 cmH2O
PH ART: 7.139 — AB (ref 7.350–7.450)
Patient temperature: 98.6
RATE: 30 resp/min
TCO2: 22.7 mmol/L (ref 0–100)
VT: 350 mL
pCO2 arterial: 69.9 mmHg (ref 35.0–45.0)
pO2, Arterial: 52.3 mmHg — ABNORMAL LOW (ref 80.0–100.0)

## 2015-09-29 LAB — RENAL FUNCTION PANEL
ALBUMIN: 2.5 g/dL — AB (ref 3.5–5.0)
ANION GAP: 16 — AB (ref 5–15)
Albumin: 2.4 g/dL — ABNORMAL LOW (ref 3.5–5.0)
Anion gap: 16 — ABNORMAL HIGH (ref 5–15)
BUN: 31 mg/dL — AB (ref 6–20)
BUN: 28 mg/dL — ABNORMAL HIGH (ref 6–20)
CHLORIDE: 102 mmol/L (ref 101–111)
CHLORIDE: 103 mmol/L (ref 101–111)
CO2: 23 mmol/L (ref 22–32)
CO2: 22 mmol/L (ref 22–32)
CREATININE: 0.8 mg/dL (ref 0.44–1.00)
Calcium: 8.9 mg/dL (ref 8.9–10.3)
Calcium: 8.6 mg/dL — ABNORMAL LOW (ref 8.9–10.3)
Creatinine, Ser: 0.67 mg/dL (ref 0.44–1.00)
GFR calc non Af Amer: >60 mL/min (ref >60)
Glucose, Bld: 214 mg/dL — ABNORMAL HIGH (ref 65–99)
Glucose, Bld: 285 mg/dL — ABNORMAL HIGH (ref 65–99)
POTASSIUM: 4.2 mmol/L (ref 3.5–5.1)
Phosphorus: 1.5 mg/dL — ABNORMAL LOW (ref 2.5–4.6)
Phosphorus: 3 mg/dL (ref 2.5–4.6)
Potassium: 4.1 mmol/L (ref 3.5–5.1)
Sodium: 141 mmol/L (ref 135–145)
Sodium: 141 mmol/L (ref 135–145)

## 2015-09-29 LAB — PATHOLOGIST SMEAR REVIEW

## 2015-09-29 LAB — CULTURE, BLOOD (ROUTINE X 2): CULTURE: NO GROWTH

## 2015-09-29 LAB — TYPE AND SCREEN
ABO/RH(D): O POS
Antibody Screen: NEGATIVE
UNIT DIVISION: 0

## 2015-09-29 LAB — MAGNESIUM: MAGNESIUM: 2.7 mg/dL — AB (ref 1.7–2.4)

## 2015-09-29 LAB — APTT: aPTT: 27 seconds (ref 24–37)

## 2015-09-29 MED ORDER — SODIUM CHLORIDE 0.9 % IV SOLN
600.0000 mg | Freq: Two times a day (BID) | INTRAVENOUS | Status: DC
  Administered 2015-09-29 – 2015-09-30 (×3): 600 mg via INTRAVENOUS
  Filled 2015-09-29 (×4): qty 600

## 2015-09-29 MED ORDER — SODIUM BICARBONATE 8.4 % IV SOLN
INTRAVENOUS | Status: DC
  Administered 2015-09-29 – 2015-09-30 (×2): via INTRAVENOUS
  Filled 2015-09-29 (×2): qty 150

## 2015-09-29 MED ORDER — PRO-STAT SUGAR FREE PO LIQD
30.0000 mL | Freq: Three times a day (TID) | ORAL | Status: DC
  Administered 2015-09-29 – 2015-09-30 (×2): 30 mL
  Filled 2015-09-29 (×2): qty 30

## 2015-09-29 MED ORDER — SODIUM CHLORIDE 0.9 % IV SOLN
0.0300 [IU]/min | INTRAVENOUS | Status: DC
  Administered 2015-09-29 – 2015-09-30 (×2): 0.03 [IU]/min via INTRAVENOUS
  Filled 2015-09-29 (×2): qty 2

## 2015-09-29 MED ORDER — SODIUM PHOSPHATE 3 MMOLE/ML IV SOLN
20.0000 mmol | Freq: Once | INTRAVENOUS | Status: AC
  Administered 2015-09-29: 20 mmol via INTRAVENOUS
  Filled 2015-09-29: qty 6.67

## 2015-09-29 NOTE — Progress Notes (Signed)
Patient's blood pressure started dropping around 1100, had to start increasing norepinephrine; at the same time the patient began to require higher amounts of oxygen. Changed patient's fluid removal rate so that the patient would be even. Will keep the patient even until I can go down on the Levophed and then start removing fluid, once again, slowly. Will continue to monitor fluid removal, blood pressure and oxygen demand. Will discuss change with Nephrology.

## 2015-09-29 NOTE — Progress Notes (Signed)
Date: September 29, 2015 Chart reviewed for concurrent status and case management needs. Will continue to follow patient for changes and needs: remains on full vent support Fi02 at 70% Velva Harman, BSN, [place], [place]

## 2015-09-29 NOTE — Progress Notes (Addendum)
[facility] Progress Note   Subjective:  Fall in BP - norepi just turned down to 25 (had been as hight as 30)- CRRT altered to keep volume even There have been more discussions today about the probable futility of this situation She remains a full code Remains unresponsive despite marked improvement in "renal numbers" with resumption of CRRT She is not "uremic" On 80% FIO2 Will get IV phos replacement once a "free line" available for phos of 1.5 Minimal UOP 20 cc recorded  I advised that technically the CRRT is progressing alright - husbands response was "so everything else is OK, right"...  Filed Vitals:   09/29/15 1245 09/29/15 1300 09/29/15 1315 09/29/15 1330  BP: 100/52 91/39 112/68 114/74  Pulse: 138 139 140 142  Temp: 97.3 F (36.3 C) 97.5 F (36.4 C) 97.7 F (36.5 C) 97.7 F (36.5 C)  TempSrc:      Resp: 34 34 33 33  Height:      Weight:      SpO2: 93% 96% 98% 96%   Exam: On vent, somnolent, not responsive (no sedation for several days) No jvd Chest coarse BS bilaterally Regular S1S2 No S3 Abd soft mod distended no mass GU foley in place, dark clear urine in tube Not much edema of ext's now Neuro as above Fem HD cath in place (2/10)  Inpatient medications: . antiseptic oral rinse  7 mL Mouth Rinse QID  . ceFTAROline (TEFLARO) IV  600 mg Intravenous Q12H  . chlorhexidine  15 mL Mouth Rinse BID  . feeding supplement (NEPRO CARB STEADY)  1,000 mL Oral Q24H  . feeding supplement (PRO-STAT SUGAR FREE 64)  30 mL Per Tube TID  . levalbuterol  0.63 mg Nebulization Q6H  . meropenem (MERREM) IV  1 g Intravenous Q12H  . pantoprazole sodium  40 mg Per Tube Daily  . sodium chloride  10-40 mL Intracatheter Q12H  . sodium phosphate  Dextrose 5% IVPB  20 mmol Intravenous Once   . norepinephrine (LEVOPHED) Adult infusion 30 mcg/min (09/29/15 1305)  . dialysis replacement fluid (prismasate) 400 mL/hr at 09/29/15 0316  . dialysis replacement fluid  (prismasate) 200 mL/hr at 09/28/15 1254  . dialysate (PRISMASATE) 1,400 mL/hr at 09/29/15 1207   acetaminophen (TYLENOL) oral liquid 160 mg/5 mL, alteplase, alteplase, artificial tears, fentaNYL (SUBLIMAZE) injection, heparin, heparin, heparin lock flush, heparin lock flush, levalbuterol, loperamide, metoprolol, [DISCONTINUED] ondansetron **OR** ondansetron (ZOFRAN) IV, polyvinyl alcohol, simethicone, sodium chloride, sodium chloride, sodium chloride flush, sodium chloride flush    Recent Labs Lab 09/28/15 0442 09/28/15 1700 09/29/15 0449  NA 139 140 141  K 4.0 4.2 4.1  CL 101 102 102  CO2 '24 24 23  '$ GLUCOSE 177* 195* 214*  BUN 69* 38* 31*  CREATININE 1.33* 0.98 0.80  CALCIUM 8.7* 8.9 8.9  PHOS 1.8* 1.9* 1.5*    Recent Labs Lab 09/23/15 1145  09/28/15 0442 09/28/15 1700 09/29/15 0449  AST 50*  --   --   --   --   ALT 74*  --   --   --   --   ALKPHOS 295*  --   --   --   --   BILITOT 0.8  --   --   --   --   PROT 6.4*  --   --   --   --   ALBUMIN 2.1*  < > 2.3* 2.3* 2.5*  < > = values in this interval not displayed.  Recent Labs Lab  09/27/15 0539 09/28/15 0442 09/28/15 1930 09/29/15 0449  WBC 32.5* 43.0*  --  56.6*  NEUTROABS 12.4* 27.9*  --  48.7*  HGB 6.4* 6.8* 8.0* 8.3*  HCT 20.3* 21.3* 25.2* 26.6*  MCV 87.5 88.8  --  89.0  PLT 61* 79*  --  80*    CXR 2/12 Small right effusion. Possible PNA. No changes in  no changes of CA  Assessment: 1. Acute renal failure - oliguric/ ATN. D#3 resumption of CRRT (initially started 09/12/15, stopped 2/7, restarted 2/11 with new fem cath.  Numbers have normalized but she has not waked up at all. Having BP/HR issues limiting ability to pull fluid and escalating pressor need. Continue all 4K fluids. Neg 0-100/hour depending on BP. For phos repletion (1.5) 20 mm sodium phos ordered for today. K is fine. No heparin. No filter issues. 2. Vol excess - FiO2 up, prob pulm edema. Unable to increase UF goal much d/t BP. Peripheral edema  seems better than what has been previously described. Unclear if current weight accurate (down from 80->74.3/24 hours - suspect incorrect. Will give a range of UF - 0-100/hour.  3. Anemia transfusing prn 4. Metastatic cancer, neuroendocrine type- s/p chemo x 1 on 1/25 w carbo/etoposide. Dr. Benay Spice has recommended no more chemo and offered 2nd onc opinion which family has declined. However remains full code. 5. Resp failure on vent 6. ID - completed 7d course IV abx.  Abx restarted 2/11 with ^^WBC/ low grade fevers, on Merrem, starting teflaro today/off vanco.   7. Hypercalcemia - resolved sp calcitonin and pamidronate. Bony mets likely cause 8. Thrombocytopenia - d/t chemo, resolving 9. AMS - no better  Jamal Maes, MD [place] Pager 09/29/2015, 2:26 PM

## 2015-09-29 NOTE — Progress Notes (Signed)
CRITICAL VALUE ALERT  Critical value received:  PH-7.139, pC02 69.9, p02 52.3  Date of notification:  09/29/15  Time of notification:  4360  Critical value read back:Yes.    Nurse who received alert:  D. Astrid Divine, RN  MD notified (1st page):  Dr. Elsworth Soho (called the box)  Time of first page:  1530  MD notified (2nd page): n/a  Time of second page:  Responding MD:  Dr. Elsworth Soho  Time MD responded: 1530  Orders received.

## 2015-09-29 NOTE — Progress Notes (Signed)
   09/29/15 1300  Clinical Encounter Type  Visited With Family  Visit Type Follow-up  Spiritual Encounters  Spiritual Needs Emotional;Other (Comment) (Pastoral Conversation/Support)  Stress Factors  Patient Stress Factors Not reviewed  Family Stress Factors Exhausted;Other (Comment);Health changes   I followed up with the patient's family who were present at the bedside when I arrived. The patient's husband, brother, father and family friend were in the room.  The family remains hopeful, but understand the severity of the patient's condition. They were trying to get her to respond when I arrived.  I spoke with the patient's husband who appeared exhausted. Patient's husband stated that he was not happy with the weekend doctor who came in to speak to him about goals of care early in the morning. He stated that the doctor told him, "You know that your wife isn't going to get out of here alive." This upset the husband, who states that he understands the patient's condition, but is trying to look for the hope in the situation.  The family are wanting to respect the patient's wishes that she was able to communicate when she was responsive. She confirmed those goals to me as well.  I debriefed with the assistant director of the department, who stated that she would address the husband's concerns.  I will continue to work with the family.    [place] M.Div.

## 2015-09-29 NOTE — Progress Notes (Signed)
Nutrition Follow-up  INTERVENTION:   Continue Nepro at goal rate of 78m/hr. Increase 30 ml Prostat to TID TF regimen provides 2028 kcal (98% of needs), 123g protein and 698 ml H20.  RD to continue to monitor   NUTRITION DIAGNOSIS:   Inadequate oral intake related to inability to eat as evidenced by NPO status.  Ongoing.  GOAL:   Patient will meet greater than or equal to 90% of their needs  Meeting.  MONITOR:   TF tolerance, Skin, Vent status, Labs, I & O's  ASSESSMENT:   Presented with about the month worsens worsening shortness of breath and wheezing. She has been seen for this by her primary care provider diagnosed with pneumonia based on chest x-ray and completed antibiotics is not improvement repeat chest x-ray showed persistent pneumonia she had a total three courses of antibiotics Including z-pack, levaquin and clarithromycin. Suspect that she has a malignancy of sort, however, it is unclear what the primary is;  Multi-compartmental masses (chest, adenopathy, ovarian).   CRRT resumed 2/11.Needs re-estimated. No plans for extubation at this time. Palliative still following.   Patient is currently intubated on ventilator support since 1/19. Pt failed extubation 2/2. MV: 11.7 L/min Temp (24hrs), Avg:97.3 F (36.3 C), Min:95.4 F (35.2 C), Max:99.1 F (37.3 C)  Medications: Norepinephrine  Labs reviewed: Elevated BUN -trending down Low Phos Elevated Mg  Diet Order:  Diet NPO time specified  Skin:  Wound (see comment) (Stage II buttocks ulcer)  Last BM:  2/6  Height:   Ht Readings from Last 1 Encounters:  09/13/15 5' 5.5" (1.664 m)    Weight:   Wt Readings from Last 1 Encounters:  09/29/15 163 lb 12.8 oz (74.3 kg)    Ideal Body Weight:  57.95 kg (kg)  BMI:  Body mass index is 26.83 kg/(m^2).  Estimated Nutritional Needs:   Kcal:  2080  Protein:  110-120g  Fluid:  1.8L/day  EDUCATION NEEDS:   No education needs identified at this  time  LClayton Bibles MS, RD, LDN Pager: [phone] Hours Pager: [phone]

## 2015-09-29 NOTE — Progress Notes (Signed)
Daily Progress Note   Patient Name: Monique [name]       Date: 09/29/2015 DOB: May 10, 1976  Age: 40 y.o. MRN#: [id number] Attending Physician: Marshell Garfinkel, MD Primary Care Physician: No primary care provider on file. Admit Date: 08/19/2015  Reason for Consultation/Follow-up: Establishing goals of care  Subjective: [name] is much worse today. Family at bedside and they tell me that today has been a bad day. When I ask Gerald Stabs how he is holding up he tells me "I'm okay, just trying to hold onto the little bit of hope that I have left." He does appear more stressed today with the events. I believe it helped for him to talk with Dr. Benay Spice although he says this was not what he wanted to hear. I expressed my sorrow for her continued decline. The feeling in the room today is more solemn than other days but they are still trying to hold out hope for some sort of improvement. I offered therapeutic listening and emotional support.   Unfortunately Monique [name] appears to have little time left. I think this family will be best served by emotional support and listening. I think it is very important to them to feel like they have done everything possible for Monique [name]. I will continue to follow up and offer emotional support.   Length of Stay: 30 days  Current Medications: Scheduled Meds:  . antiseptic oral rinse  7 mL Mouth Rinse QID  . ceFTAROline (TEFLARO) IV  600 mg Intravenous Q12H  . chlorhexidine  15 mL Mouth Rinse BID  . feeding supplement (NEPRO CARB STEADY)  1,000 mL Oral Q24H  . feeding supplement (PRO-STAT SUGAR FREE 64)  30 mL Per Tube TID  . levalbuterol  0.63 mg Nebulization Q6H  . meropenem (MERREM) IV  1 g Intravenous Q12H  . pantoprazole sodium  40 mg Per Tube Daily  . sodium chloride   10-40 mL Intracatheter Q12H  . sodium phosphate  Dextrose 5% IVPB  20 mmol Intravenous Once    Continuous Infusions: . norepinephrine (LEVOPHED) Adult infusion 24.96 mcg/min (09/29/15 1500)  . dialysis replacement fluid (prismasate) 400 mL/hr at 09/29/15 0316  . dialysis replacement fluid (prismasate) 200 mL/hr at 09/29/15 1520  . dialysate (PRISMASATE) 1,400 mL/hr at 09/29/15 1207    PRN Meds: acetaminophen (TYLENOL) oral liquid 160 mg/5  mL, alteplase, alteplase, artificial tears, fentaNYL (SUBLIMAZE) injection, heparin, heparin, heparin lock flush, heparin lock flush, levalbuterol, loperamide, metoprolol, [DISCONTINUED] ondansetron **OR** ondansetron (ZOFRAN) IV, polyvinyl alcohol, simethicone, sodium chloride, sodium chloride, sodium chloride flush, sodium chloride flush  Physical Exam: Physical Exam  Constitutional: She appears well-developed and well-nourished. She is intubated.  HENT:  Head: Normocephalic and atraumatic.  Cardiovascular: Tachycardia present.   Pulmonary/Chest: No accessory muscle usage. Tachypnea noted. She is intubated. No respiratory distress.  Abdominal: Soft. Normal appearance.  Neurological: She is unresponsive.  Sedated on vent. Less responsive today.                 Vital Signs: BP 95/55 mmHg  Pulse 138  Temp(Src) 98.2 F (36.8 C) (Oral)  Resp 35  Ht 5' 5.5" (1.664 m)  Wt 74.3 kg (163 lb 12.8 oz)  BMI 26.83 kg/m2  SpO2 93%  LMP 08/28/2015 SpO2: SpO2: 93 % O2 Device: O2 Device: Ventilator O2 Flow Rate: O2 Flow Rate (L/min): 40 L/min  Intake/output summary:   Intake/Output Summary (Last 24 hours) at 09/29/15 1527 Last data filed at 09/29/15 1500  Gross per 24 hour  Intake 2510.19 ml  Output   5441 ml  Net -2930.81 ml   LBM: Last BM Date:  (patient has a flexiseal ) Baseline Weight: Weight: 82.555 kg (182 lb) Most recent weight: Weight: 74.3 kg (163 lb 12.8 oz)       Palliative Assessment/Data: Flowsheet Rows        Most Recent Value     Intake Tab    Referral Department  Critical care   Unit at Time of Referral  ICU   Palliative Care Primary Diagnosis  Cancer   Date Notified  09/22/15   Palliative Care Type  New Palliative care   Reason for referral  Clarify Goals of Care   Date of Admission  08/28/2015   Date first seen by Palliative Care  09/23/15   # of days Palliative referral response time  1 Day(s)   # of days IP prior to Palliative referral  24   Clinical Assessment    Psychosocial & Spiritual Assessment    Palliative Care Outcomes       Additional Data Reviewed: CBC    Component Value Date/Time   WBC 56.6* 09/29/2015 0449   RBC 2.99* 09/29/2015 0449   HGB 8.3* 09/29/2015 0449   HCT 26.6* 09/29/2015 0449   PLT 80* 09/29/2015 0449   MCV 89.0 09/29/2015 0449   MCH 27.8 09/29/2015 0449   MCHC 31.2 09/29/2015 0449   RDW 16.8* 09/29/2015 0449   LYMPHSABS 2.8 09/29/2015 0449   MONOABS 4.0* 09/29/2015 0449   EOSABS 0.0 09/29/2015 0449   BASOSABS 0.0 09/29/2015 0449    CMP     Component Value Date/Time   NA 141 09/29/2015 0449   K 4.1 09/29/2015 0449   CL 102 09/29/2015 0449   CO2 23 09/29/2015 0449   GLUCOSE 214* 09/29/2015 0449   BUN 31* 09/29/2015 0449   CREATININE 0.80 09/29/2015 0449   CALCIUM 8.9 09/29/2015 0449   PROT 6.4* 09/23/2015 1145   ALBUMIN 2.5* 09/29/2015 0449   AST 50* 09/23/2015 1145   ALT 74* 09/23/2015 1145   ALKPHOS 295* 09/23/2015 1145   BILITOT 0.8 09/23/2015 1145   GFRNONAA >60 09/29/2015 0449   GFRAA >60 09/29/2015 0449       Problem List:  Patient Active Problem List   Diagnosis Date Noted  . Palliative care encounter   .  Neuromuscular weakness ([place])   . Muscular deconditioning   . Lung cancer ([place])   . Cancer ([place])   . Pressure ulcer 09/16/2015  . Ileus ([place])   . Small cell lung cancer ([place])   . Mass   . Acute respiratory failure ([place])   . Acute respiratory failure with hypoxemia ([place])   . AKI (acute kidney injury) ([place])   . Altered mental state    . Carcinoma of unknown primary ([place]) 09/10/2015  . Difficult intravenous access   . Metastatic cancer ([place])   . Adenopathy   . Hypoxia   . Uterine mass   . Acute respiratory failure with hypoxia ([place]) 08/30/2015  . Postobstructive pneumonia 08/30/2015  . Leukocytosis 08/30/2015  . Sepsis due to pneumonia ([place]) 08/30/2015  . Lung mass 08/24/2015     Palliative Care Assessment & Plan    1.Code Status:  Full code - I have not addressed this specifically although it has been addressed multiple times by other providers.     Code Status Orders        Start     Ordered   09/18/15 1818  Full code   Continuous     09/18/15 1817    Code Status History    Date Active Date Inactive Code Status Order ID Comments User Context   09/18/2015  4:14 PM 09/18/2015  6:17 PM Partial Code [id number]  Juanito Doom, MD Inpatient   09/10/2015  3:40 PM 09/18/2015  4:14 PM DNR [id number]  Erick Colace, NP Inpatient   09/09/2015  9:04 PM 09/10/2015  3:40 PM Full Code [id number]  Rush Farmer, MD Inpatient   09/07/2015  4:12 PM 09/09/2015  9:04 PM Partial Code [id number]  Marshell Garfinkel, MD Inpatient   08/30/2015  2:58 PM 09/07/2015  4:12 PM Full Code [id number]  Toy Baker, MD Inpatient       2. Goals of Care/Additional Recommendations:  Continue full aggressive care. They seem more acutely aware of the severity of her current condition although are trying to hold out hope.   Limitations on Scope of Treatment: Full Scope Treatment  Desire for further Chaplaincy support:yes  Psycho-social Needs: Caregiving  Support/Resources, Grief/Bereavement Support and [facility] Referral  3. Symptom Management:      1. Pain: Recommend fentanyl or dilaudid over morphine with renal failure.   4. Palliative Prophylaxis:   Frequent Pain Assessment, Oral Care and Turn Reposition  5. Prognosis: Extremely poor with new metastatic cancer with multiorgan failure limiting options for improvement and reversal of  critical status. High risk for acute decompensation and even death at any time during hospitalization.   6. Discharge Planning:  To be determined on outcomes. Unfortunately I expect hospital death.    Thank you for allowing the Palliative Medicine Team to assist in the care of this patient.   Time In: 1610 Time Out: 1700 Total Time 79mn Prolonged Time Billed  no         APershing Proud NP  27/35/3299 3:27 PM  Please contact Palliative Medicine Team phone at [phone] questions and concerns.

## 2015-09-29 NOTE — Progress Notes (Signed)
PCCM PROGRESS NOTE  ADMISSION DATE: 09/14/2015 CONSULT DATE: 08/30/2015 REFERRING PROVIDER: Dr. Roel Cluck, [facility]  BRIEF Hx:  40 yo female with no PMH admitted 1/13 with progressive dyspnea, wheezing. 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  S/p chemo -carbo + etoposide.  Failed extubation x 1.  Now with diffuse weakness ? CIN vs paraneoplastic - no cord compression on MRI.   MICROBIOLOGY: Tracheal Asp Ctx 2/12>>> Blood Ctx x2 2/8:  1/2 Coag Neg Staph Trach Asp Ctx 2/4:  Candida Blood Ctx x2 2/4:  Negative Urine Ctx 2/4:  Yeast  BAL 1/22:  Candida Blood Ctx x2 1/21:  Negative  Trach Asp 1/21:  Candida Blood Ctx x2 1/13:  Negative HIV 1/14:  Negative Urine Strep Ag 1/14:  Negative Urine Legionella Ag 1/14:  Negative C diff PCR 1/15:  Negative  ANTIBIOTICS: Ceftaz 2/4 - 2/11 Unasyn 2/3 - 2/4 Vanc 2/4 - 2/9 Difulcan 2/7 - 2/9 Rocephin 1/13 Zithromax 1/13 Vancomycin 1/14 - 1/19, restart 1/21>>>off Zosyn 1/14 - 1/20 Ceftaz 1/21 >> off Dapto 2/12 >> 2/13 off (inactivated by lung surfactant) Vanoc 2/12 >> 2/13  Merrem 2/12 >>  Ceftaroline 2/13 >>     LINES/TUBES: R Fem Temp HD CVL 2/10>> OETT 8.0 2/2>> Foley 2/7>> OGT 2/2>> Rectal Tube>> PIV x1 Rt PICC 1/15 - 1/24 Left IJ CVL 1/24 - 2/9 HD cath 1/27 - 2/8  STUDIES: CT chest 1/13: multiple b/l nodules, mass like consolidation Rt mid lung, Rt hilar and subcarinal mass, Lt hilar LAN, 2.7 cm Rt paratracheal LAN CT abd/pelvis 1/14: 10 cm mass superior to uterus Tumor Markers 1/15: CA 19-9 453, CEA 3.9, CA 125 780.8 Bronchoscopy 1/16: crush artifact ?small cell lung cancer (non diagnostic) lower ext doppler 1/24: left DVT peroneal vein EEG 1/28:  Generalized nonspecific dysfunction/encephalopathy. No seizure activity. CT chest abdomen pelvis 2/4: Right lung mass smaller, surrounding consolidation noted, left  lung nodules smaller, pelvic mass smaller Port CXR 2/12:  ETT in good position. No change in bilateral nodules & RLL opacification.  EVENTS: 1/13 Admit 1/15 Gyn oncology consulted 1/16 Bronchoscopy in ICU; Placed on BiPAP for wheezing, dyspnea 1/19 IR biopsy >> Poorly differentiated high Grade Carcinoma >stains favor Neuroendocrine. TTF stain was NEGATIVE 1/22 bronch cytology: neg  1/25 Etoposide/carboplatin cycle #1 1/28 EEG>>> diffuse slowly (performed while uremic) 1/31 weaning  2/01 gave fluid challenge and changed to even volume status.  2/02 passed SBT. Extubated. Re-intubated 2/03 again approached family re: goals of care. They want full support 2/05 neurology consult  2/09 Off CVVHD, some UOP, rising BUN/CR, tmax 100 2/11 Restarted CVVHD due to worsening volume status & renal function 2/12 Increasing ETT secretions & peak pressures  SUBJECTIVE:  RN reports pt with increasing levophed requirements to 52mg, CVVH ongoing - pulling 100-1579mhr, BP dropped into the 6057'Dystolic with desaturations to low 80's with slow recovery.  O2 increased to 90%.  Sputum culture pending.  [facility] contacted regarding paraneoplastic syndrome, results to be faxed.  Appears volume overloaded on exam. Husband concerned the volume removal is causing hypotension.       REVIEW OF SYSTEMS:  Unable to obtain as patient is intubated.  VITAL SIGNS: BP 96/53 mmHg  Pulse 121  Temp(Src) 96.6 F (35.9 C) (Core (Comment))  Resp 36  Ht 5' 5.5" (1.664 m)  Wt 163 lb 12.8 oz (74.3 kg)  BMI 26.83 kg/m2  SpO2 92%  LMP 08/20/2015  INTAKE/OUTPUT: I/O last 3 completed shifts: In: 3108.6 [I.V.:127.6; Blood:30; Other:715; AL/PF:7902; IV Piggyback:450] Out: 6106 [Urine:15; Other:5741; Stool:350]  General: Eyes closed. Husband, father, brother and friend at bedside. No distress. HENT: OETT in place. No scleral injection. PULM: Coarse breath sounds bilaterally. Symmetric chest rise on ventilator. CV: Regular  rhythm. Unable to appreciate JVD. Anasarca. GI: Soft. Bowel sounds present. Protuberant. Integument:  Warm & dry. No rash on exposed skin. Neuro: Not following commands. Eyes mostly closed. No response to voice. No withdrawal to pain in all 4 extremities.  No gag with suction  CBC Recent Labs     09/27/15  0539  09/28/15  0442  09/28/15  1930  09/29/15  0449  WBC  32.5*  43.0*   --   56.6*  HGB  6.4*  6.8*  8.0*  8.3*  HCT  20.3*  21.3*  25.2*  26.6*  PLT  61*  79*   --   80*    Coag's Recent Labs     09/28/15  0442  09/29/15  0449  APTT  26  27    BMET Recent Labs     09/28/15  0442  09/28/15  1700  09/29/15  0449  NA  139  140  141  K  4.0  4.2  4.1  CL  101  102  102  CO2  '24  24  23  '$ BUN  69*  38*  31*  CREATININE  1.33*  0.98  0.80  GLUCOSE  177*  195*  214*    Electrolytes Recent Labs     09/27/15  0539   09/28/15  0442  09/28/15  1700  09/29/15  0449  CALCIUM  9.8   < >  8.7*  8.9  8.9  MG  2.9*   --   2.7*   --   2.7*  PHOS  4.3   < >  1.8*  1.9*  1.5*   < > = values in this interval not displayed.    Sepsis Markers No results for input(s): PROCALCITON, O2SATVEN in the last 72 hours.  Invalid input(s): LACTICACIDVEN  ABG Recent Labs     09/28/15  0425  PHART  7.261*  PCO2ART  53.3*  PO2ART  66.8*    Liver Enzymes Recent Labs     09/28/15  0442  09/28/15  1700  09/29/15  0449  ALBUMIN  2.3*  2.3*  2.5*    Cardiac Enzymes No results for input(s): TROPONINI, PROBNP in the last 72 hours.  Glucose No results for input(s): GLUCAP in the last 72 hours.  Imaging Dg Chest Port 1 View  09/28/2015  CLINICAL DATA:  Acute onset of headache.  Initial encounter. EXAM: PORTABLE CHEST 1 VIEW COMPARISON:  Chest radiograph from 09/26/2015 FINDINGS: The endotracheal tube is seen ending 3 cm above the carina. The patient's enteric tube is noted extending below the diaphragm. A small right pleural effusion is noted. The patient's metastatic lung  cancer is again noted. Opacities appear somewhat worsened from the prior study, raising concern for superimposed pneumonia. No pneumothorax is seen. The cardiomediastinal silhouette is normal in size. No acute osseous abnormalities are identified. IMPRESSION: 1. Endotracheal tube seen ending 3 cm above the carina. 2. Metastatic lung cancer again noted bilaterally. Opacities appear somewhat worsened from the prior study, raising concern for superimposed pneumonia. Electronically Signed   By: Garald Balding M.D.   On: 09/28/2015 04:22    ASSESSMENT/PLAN:  HEMATOLOGY/ONCOLOGY A: Poorly Differentiated  High Grade Carcinoma - Advanced & treating as Small Cell, stains favor Neuroendocrine, chemotherapy completed on 1/25 x1 dose Anemia - No signs of active bleeding. Transfused 1/30. DVT - left peroneal vein and Right UE extending into the Subclavian, PICC removed. 2/6 Heparin stopped due to thrombocytopenia. Pancytopenia - Secondary to chemotherapy.  P: Oncology following, notes reflect they recommend no further chemotherapy Neupogen per Oncology Hgb/Hct post transfusion SCDs  PULMONARY A: Acute Hypoxic Respiratory Failure - Multifactorial from lung cancer & neuromuscular weakness. HCAP Lingula & LLL Lung - Seen on 2/4 CT. S/P BAL.  P: Full Support w/ 6cc/kg IBW Xopenex nebs q6hr Chest PT via bed q4hr Questionable role of tracheostomy given dismal prognosis  NEUROLOGY A: Pain - Secondary to Cancer. Acute Encephalopathy - Improved after HD. Intermittent delirium due to hypercarbia. Profound Neuromuscular Weakness - MRI C-spin w/ bone mets but w/o cord compression. 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  Paraneoplastic profile is negative.    P: Sedation minimized at family's request Fentanyl IV prn but husband refusing for patient PT as tolerated Paraneoplastic Profile negative (see paper chart for results)  CARDIAC A: Sinus Tachycardia  P: Continue telemetry  monitoring  RENAL A: Acute Renal Failure - Oliguric. Restarted on CVVHD 2/11 Hypophosphatemia  P: Nephrology following CVVHD per recommendations Trending BUN/Creatinine daily for renal function Monitoring UOP with Foley Daily electrolyte panel Replace electrolytes as indicated   GASTROENTEROLOGY A: Severe Protein Calorie Malnutrition Loose Stool - Resolved. C diff negative. Pancreatic Inflammation on CT - Lipase normal.  P: Tube Feedings Protonix via tube daily Protonix for SUP Imodium PRN   INFECTION A: LLL & Lingula HCAP - Seen on CT. FUO 2/8 - Line sepsis vs Tumor vs DVT. Thought to be secondary to timing of chemo. Oral Candidiasis - Completed Dilfucan. Possible Fungal UTI - Treated w/ Diflucan as well & foley replaced ~2/7.  P: Follow cultures as above Empiric Merrem Day #2 Tressie Ellis finished 2/11 Finished line holiday. Monitoring for fever Hold vanco for now given renal concerns, unable to give dapto as inactivated by lung surfactant, no linezolid with thrombocytopenia >> empiric ceftaroline for now.  Although, no MRSA hx.   ENDOCRINE A: Hyperglycemia - BG controlled. No h/o DM.  P: Monitor glucose on BMP daily   Family: Husband, brother, father, & friend updated at bedside 2/13.  Confirmed code status given demonstrated hemodynamic instability am of 2/13   Noe Gens, NP-C [facility] Pgr: [phone] or if no answer [phone] 09/29/2015, 8:46 AM  PCCM Attending Note: I have seen and examined the patient with nurse practitioner. Please refer to her note which I reviewed in detail. 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 Patient has had further clinical deterioration over the last 24 hours requiring escalating doses of vasopressor to maintain mean arterial pressure as well as increasing FiO2 with still borderline high peak pressures. Family were at bedside this morning including her husband, father, and family  friends. All were updated by not only medical oncology but our team as well regarding her continued clinical deterioration. I am adding additional anabiotic's specifically for coverage of MRSA although the patient has never cultured this out on repeat specimens. I explained to all present that with the patient's continued clinical decline and multisystem organ failure I do not expect her to survive this admission/this illness. I explained that we are maximally supporting her with not only vasopressors, ventilator, and continuous renal replacement therapy but also with antibiotics to limited effect. Family  have expressed that they wish her CODE STATUS to remain full code even in her critical state with dismal prognosis.  I have spent a total of 34 minutes of critical care time today caring for the patient, reviewing the patient's electronic medical record, discussing the patient's antibiotic regimen with ICU pharmacist, and discussing the patient's current clinical state and prognosis as well as plan with her family and friends at bedside.  Sonia Baller Ashok Cordia, M.D. [facility] Pager:  [phone] After 3pm or if no response, call [phone]

## 2015-09-29 NOTE — Progress Notes (Signed)
CRITICAL VALUE ALERT  Critical value received:  WBC 56.6  Date of notification:  09/29/2015  Time of notification:  0530  Critical value read back:Yes.    Nurse who received alert:  Reche Dixon  MD notified (1st page):  Deterding  Time of first page:  0531  MD notified (2nd page):  Time of second page:  Responding MD:  Deterding  Time MD responded:  [phone]

## 2015-09-29 NOTE — Progress Notes (Signed)
eLink Physician-Brief Progress Note Patient Name: Monique [name] DOB: 05-15-1976 MRN: [id number]   Date of Service  09/29/2015  HPI/Events of Note  ABG - resp acidosis Peak pr 45 on Tv 350  eICU Interventions  Increae RR to 35 Add bicarb gtt  Add PEEP 10 May need sedation to RASS goal -1     Intervention Category Major Interventions: Acid-Base disturbance - evaluation and management  Shaakira Borrero V. 09/29/2015, 3:39 PM

## 2015-09-29 NOTE — Progress Notes (Signed)
IP PROGRESS NOTE  Subjective:   She remains unresponsive. Her husband and other family members are at the bedside.  Objective: Vital signs in last 24 hours: Blood pressure 97/54, pulse 135, temperature 95.4 F (35.2 C), temperature source Oral, resp. rate 35, height 5' 5.5" (1.664 m), weight 163 lb 12.8 oz (74.3 kg), last menstrual period 08/19/2015, SpO2 93 %.  Intake/Output from previous day: 02/12 0701 - 02/13 0700 In: 2438.6 [I.V.:117.6; Blood:30; NG/GT:1246; IV Piggyback:450] Out: 0109 [Urine:10; Stool:350]  Physical Exam:  HEENT: ET tube in place Abdomen: Distended Lungs: Wheeze at the right upper anterior chest, Q sounds at the left upper chest Cardiac: Distant heart sounds  Neurologic: Unresponsive  Lab Results:  Recent Labs  09/28/15 0442 09/28/15 1930 09/29/15 0449  WBC 43.0*  --  56.6*  HGB 6.8* 8.0* 8.3*  HCT 21.3* 25.2* 26.6*  PLT 79*  --  80*   ANC 48.7   BMET  Recent Labs  09/28/15 1700 09/29/15 0449  NA 140 141  K 4.2 4.1  CL 102 102  CO2 24 23  GLUCOSE 195* 214*  BUN 38* 31*  CREATININE 0.98 0.80  CALCIUM 8.9 8.9    Studies/Results: Dg Chest Port 1 View  09/28/2015  CLINICAL DATA:  Acute onset of headache.  Initial encounter. EXAM: PORTABLE CHEST 1 VIEW COMPARISON:  Chest radiograph from 09/26/2015 FINDINGS: The endotracheal tube is seen ending 3 cm above the carina. The patient's enteric tube is noted extending below the diaphragm. A small right pleural effusion is noted. The patient's metastatic lung cancer is again noted. Opacities appear somewhat worsened from the prior study, raising concern for superimposed pneumonia. No pneumothorax is seen. The cardiomediastinal silhouette is normal in size. No acute osseous abnormalities are identified. IMPRESSION: 1. Endotracheal tube seen ending 3 cm above the carina. 2. Metastatic lung cancer again noted bilaterally. Opacities appear somewhat worsened from the prior study, raising concern for  superimposed pneumonia. Electronically Signed   By: Garald Balding M.D.   On: 09/28/2015 04:22    Medications: I have reviewed the patient's current medications.  Assessment/Plan:  1. Metastatic carcinoma with a dominant right lung mass, bilateral lung masses, chest/abdominal lymphadenopathy, and a pelvic mass  Bronchoscopy 09/01/2015 revealed endobronchial lesions at the right upper lobe and right middle lobe, biopsies revealed malignant cells without a definitive diagnosis  CT-guided biopsy of a respiratory lymph node 09/04/2015 confirmed poorly differential carcinoma, with some features of a neuroendocrine carcinoma.  Cycle 1 etoposide/carboplatin 09/10/2015  CTs chest, abdomen, and pelvis on 09/19/2015-mild decrease in dominant right lower lobe mass, new cell adenopathy, and pulmonary masses. Slight enlargement of the pelvic mass.  MRI cervical spine 09/22/2015 with diffuse bone metastases, no epidural tumor  2. Respiratory failure secondary to #1 and pneumonia, and possibly motor weakness secondary to deconditioning or a paraneoplastic syndrome  3. Renal failure-CRRT resumed on 09/27/2015  4. Left leg and right upper extremity DVTs 09/09/2015, treated with heparin, discontinued secondary to severe thrombocytopenia  5.  Altered mental status secondary to uremia, hypercalcemia, and respiratory failure  6.  Anemia secondary to phlebotomy, metastatic carcinoma, and renal failure, status post a red cell transfusion 09/15/2015  7.  Leukopenia/thrombocytopenia secondary to chemotherapy, improved, now with reactive leukocytosis  8.  Extremity weakness-most likely related to deconditioning/critical illness, paraneoplastic panel pending   9.  Fever, treated with Diflucan for treatment of Candida in the sputum, blood culture from 09/24/2015-positive for coag negative staph  10. Hypercalcemia-status post pamidronate 09/24/2015-improved  Ms. Zerby  is now at day 20 following a first  cycle of etoposide/carboplatin.   She remains critically ill with multiorgan failure. Her mental status has not improved with resumption of the CRRT. The etiology of her altered mental status is unclear, likely multifactorial. She could have unrecognized CNS metastases in addition to the renal failure, hypercarbia/hypoxia, and critical illness delirium.  I had a long discussion with her husband, or other, and father. I explained the poor prognosis. I do not recommend further chemotherapy. I offered a formal second oncology opinion and they declined this. I have reviewed the case with multiple medical oncology colleagues.  They are interested in the opinion of neurology regarding her unresponsiveness. The serum paraneoplastic panel is pending.  The family remains hopeful she will regain her consciousness and be able to communicate with them.         LOS: 30 days   Caress Reffitt, Dominica Severin  09/29/2015, 12:27 PM

## 2015-09-30 ENCOUNTER — Inpatient Hospital Stay (HOSPITAL_COMMUNITY): Payer: BC Managed Care – PPO

## 2015-09-30 LAB — CBC WITH DIFFERENTIAL/PLATELET
Band Neutrophils: 15 %
Basophils Absolute: 0 10*3/uL (ref 0.0–0.1)
Basophils Relative: 0 %
Blasts: 3 %
EOS PCT: 0 %
Eosinophils Absolute: 0 10*3/uL (ref 0.0–0.7)
HEMATOCRIT: 26.3 % — AB (ref 36.0–46.0)
HEMOGLOBIN: 8.1 g/dL — AB (ref 12.0–15.0)
LYMPHS ABS: 3.7 10*3/uL (ref 0.7–4.0)
LYMPHS PCT: 6 %
MCH: 28.3 pg (ref 26.0–34.0)
MCHC: 30.8 g/dL (ref 30.0–36.0)
MCV: 92 fL (ref 78.0–100.0)
MONOS PCT: 4 %
Metamyelocytes Relative: 0 %
Monocytes Absolute: 2.5 10*3/uL — ABNORMAL HIGH (ref 0.1–1.0)
Myelocytes: 10 %
NEUTROS ABS: 53.7 10*3/uL — AB (ref 1.7–7.7)
NEUTROS PCT: 56 %
NRBC: 14 /100{WBCs} — AB
Other: 0 %
Platelets: 66 10*3/uL — ABNORMAL LOW (ref 150–400)
Promyelocytes Absolute: 6 %
RBC: 2.86 MIL/uL — AB (ref 3.87–5.11)
RDW: 17.3 % — ABNORMAL HIGH (ref 11.5–15.5)
WBC: 61.7 10*3/uL — AB (ref 4.0–10.5)

## 2015-09-30 LAB — BLOOD GAS, ARTERIAL
Acid-base deficit: 11.4 mmol/L — ABNORMAL HIGH (ref 0.0–2.0)
Bicarbonate: 18 mEq/L — ABNORMAL LOW (ref 20.0–24.0)
DRAWN BY: 276051
FIO2: 1
O2 CONTENT: 35.6 L/min
O2 Saturation: 35.6 %
PEEP: 10 cmH2O
PH ART: 7.087 — AB (ref 7.350–7.450)
Patient temperature: 37
RATE: 35 resp/min
TCO2: 18.5 mmol/L (ref 0–100)
VT: 450 mL
pCO2 arterial: 62.6 mmHg (ref 35.0–45.0)

## 2015-09-30 LAB — RENAL FUNCTION PANEL
ANION GAP: 20 — AB (ref 5–15)
Albumin: 2.1 g/dL — ABNORMAL LOW (ref 3.5–5.0)
BUN: 23 mg/dL — ABNORMAL HIGH (ref 6–20)
CALCIUM: 8.1 mg/dL — AB (ref 8.9–10.3)
CO2: 18 mmol/L — AB (ref 22–32)
Chloride: 100 mmol/L — ABNORMAL LOW (ref 101–111)
Creatinine, Ser: 0.63 mg/dL (ref 0.44–1.00)
Glucose, Bld: 277 mg/dL — ABNORMAL HIGH (ref 65–99)
Phosphorus: 3.8 mg/dL (ref 2.5–4.6)
Potassium: 4.9 mmol/L (ref 3.5–5.1)
SODIUM: 138 mmol/L (ref 135–145)

## 2015-09-30 LAB — GLUCOSE, CAPILLARY: GLUCOSE-CAPILLARY: 265 mg/dL — AB (ref 65–99)

## 2015-09-30 LAB — CULTURE, RESPIRATORY W GRAM STAIN

## 2015-09-30 LAB — CULTURE, RESPIRATORY

## 2015-09-30 LAB — PTH-RELATED PEPTIDE: PTH-related peptide: <1.1 pmol/L

## 2015-09-30 LAB — MAGNESIUM: Magnesium: 2.7 mg/dL — ABNORMAL HIGH (ref 1.7–2.4)

## 2015-09-30 LAB — APTT: APTT: 28 s (ref 24–37)

## 2015-09-30 MED ORDER — SODIUM CHLORIDE 0.9 % IV SOLN
1.0000 mg/h | INTRAVENOUS | Status: DC
  Administered 2015-09-30: 2 mg/h via INTRAVENOUS
  Filled 2015-09-30: qty 10

## 2015-09-30 MED ORDER — LORAZEPAM 2 MG/ML IJ SOLN: 2.0000 mg | Freq: Once | INTRAMUSCULAR | Status: AC

## 2015-09-30 MED ORDER — LORAZEPAM 2 MG/ML IJ SOLN: 2.0000 mg | INTRAMUSCULAR | Status: DC | PRN

## 2015-09-30 MED ORDER — SODIUM BICARBONATE 8.4 % IV SOLN
INTRAVENOUS | Status: AC
  Administered 2015-09-30: 100 meq via INTRAVENOUS
  Filled 2015-09-30: qty 50

## 2015-09-30 MED ORDER — SODIUM BICARBONATE 8.4 % IV SOLN
100.0000 meq | Freq: Once | INTRAVENOUS | Status: AC
  Administered 2015-09-30: 100 meq via INTRAVENOUS

## 2015-09-30 MED ORDER — ATROPINE SULFATE 1 % OP SOLN
1.0000 [drp] | Freq: Four times a day (QID) | OPHTHALMIC | Status: DC | PRN
  Administered 2015-09-30: 1 [drp] via SUBLINGUAL
  Filled 2015-09-30: qty 2

## 2015-09-30 MED ORDER — LORAZEPAM 2 MG/ML IJ SOLN
INTRAMUSCULAR | Status: AC
  Administered 2015-09-30: 2 mg
  Filled 2015-09-30: qty 1

## 2015-09-30 MED FILL — Medication: Qty: 1 | Status: AC

## 2015-10-01 LAB — BLOOD GAS, VENOUS
Acid-base deficit: 13.8 mmol/L — ABNORMAL HIGH (ref 0.0–2.0)
Bicarbonate: 17.4 mEq/L — ABNORMAL LOW (ref 20.0–24.0)
DRAWN BY: 365271
FIO2: 1
MECHVT: 350 mL
O2 Saturation: 52.2 %
PATIENT TEMPERATURE: 36.5
PEEP/CPAP: 10 cmH2O
PH VEN: 6.982 — AB (ref 7.250–7.300)
RATE: 35 resp/min
TCO2: 18.6 mmol/L (ref 0–100)
pCO2, Ven: 76.5 mmHg (ref 45.0–50.0)

## 2015-10-02 ENCOUNTER — Telehealth: Payer: Self-pay

## 2015-10-02 NOTE — Telephone Encounter (Signed)
On 10/02/2015 I received a death certificate from [facility] (original). The death certificate is for cremation. The patient is a patient of [facility]. The death certificate will be taken to Pulmonary Unit @ [facility] this afternoon for signature. On 10/10/15 I received the death certificate back from [facility]. I got the death certificate ready and called the funeral home to let them know the death certificate is ready for pickup.

## 2015-10-03 LAB — CULTURE, BLOOD (ROUTINE X 2)
CULTURE: NO GROWTH
Culture: NO GROWTH

## 2015-10-15 NOTE — ED Provider Notes (Signed)
[facility]  Department of Emergency Medicine   Code Blue CONSULT NOTE  Chief Complaint: Cardiac arrest/unresponsive   Level V Caveat: Unresponsive  History of present illness: I was contacted by the hospital for a CODE BLUE cardiac arrest upstairs and presented to the patient's bedside.   ROS: Unable to obtain, Level V caveat  Scheduled Meds: . antiseptic oral rinse  7 mL Mouth Rinse QID  . ceFTAROline (TEFLARO) IV  600 mg Intravenous Q12H  . chlorhexidine  15 mL Mouth Rinse BID  . feeding supplement (NEPRO CARB STEADY)  1,000 mL Oral Q24H  . feeding supplement (PRO-STAT SUGAR FREE 64)  30 mL Per Tube TID  . levalbuterol  0.63 mg Nebulization Q6H  . meropenem (MERREM) IV  1 g Intravenous Q12H  . pantoprazole sodium  40 mg Per Tube Daily  . sodium bicarbonate  100 mEq Intravenous Once  . sodium chloride  10-40 mL Intracatheter Q12H   Continuous Infusions: . norepinephrine (LEVOPHED) Adult infusion 45 mcg/min (2015/10/21 0612)  . dialysis replacement fluid (prismasate) 400 mL/hr at Oct 21, 2015 0521  . dialysis replacement fluid (prismasate) 200 mL/hr at 09/29/15 1520  . dialysate (PRISMASATE) 1,400 mL/hr at 10/21/2015 0307  .  sodium bicarbonate  infusion 1000 mL 75 mL/hr at 09/29/15 1900  . vasopressin (PITRESSIN) infusion - *FOR SHOCK* 0.03 Units/min (10-21-15 0600)   PRN Meds:.acetaminophen (TYLENOL) oral liquid 160 mg/5 mL, alteplase, alteplase, artificial tears, fentaNYL (SUBLIMAZE) injection, heparin, heparin, heparin lock flush, heparin lock flush, levalbuterol, loperamide, metoprolol, [DISCONTINUED] ondansetron **OR** ondansetron (ZOFRAN) IV, polyvinyl alcohol, simethicone, sodium chloride, sodium chloride, sodium chloride flush, sodium chloride flush History reviewed. No pertinent past medical history. History reviewed. No pertinent past surgical history. Social History   Social History  . Marital Status: Married    Spouse Name: N/A  . Number of Children: N/A   . Years of Education: N/A   Occupational History  . Not on file.   Social History Main Topics  . Smoking status: Never Smoker   . Smokeless tobacco: Not on file  . Alcohol Use: Yes     Comment: rare  . Drug Use: No  . Sexual Activity: Not on file   Other Topics Concern  . Not on file   Social History Narrative   No Known Allergies  Last set of Vital Signs (not current) Filed Vitals:   2015/10/21 0530 10-21-15 0540  BP: 142/97 115/55  Pulse: 136 125  Temp: 97.9 F (36.6 C) 97.7 F (36.5 C)  Resp: 11 31      Physical Exam Gen: unresponsive Cardiovascular: pulseless  Abd: distended with striae Neuro: GCS 3, unresponsive to pain  Musculoskeletal: No deformity  Skin: warm  Procedures  CPR in progress 2 epis given.  With spontaneous return to circulation.  CBG 265 Labs sent.     Cardiopulmonary Resuscitation (CPR) Procedure Note Directed/Performed by: Carlisle Beers I personally directed ancillary staff and/or performed CPR in an effort to regain return of spontaneous circulation and to maintain cardiac, neuro and systemic perfusion.    Medical Decision making  Acidosis, from renal failure  Assessment and Plan  continue CVVH Increase tidal volume ABG   Dr. Jimmy Footman on [facility]   Charise Leinbach, MD 10/21/2015 [phone]

## 2015-10-15 NOTE — Progress Notes (Signed)
Called to bedside for seizure like activity.  Rapid movement of eyes, head and hands.     Plan: 107mIV ativan now PRN ativan for seizure Add atropine gtts for secretions    BNoe Gens NP-C [facility] Pgr: [phone] or if no answer [phone]/11/2015 10:06 AM

## 2015-10-15 NOTE — Progress Notes (Signed)
Approximately 0520 patients heart rate decreased from 120's rapidly down into the 30's. Code blue was called, pads placed, chest compressions initiated, ED MD to bedside, [facility] MD notified, and medications administered. SROC noted at 0530. Patient survived event. Will continue to monitor.

## 2015-10-15 NOTE — Progress Notes (Signed)
IP PROGRESS NOTE  Subjective:   She remains unresponsive. She had a cardiac arrest this morning. She remains on pressor support.  Objective: Vital signs in last 24 hours: Blood pressure 79/41, pulse 112, temperature 95.9 F (35.5 C), temperature source Core (Comment), resp. rate 28, height 5' 5.5" (1.664 m), weight 163 lb 12.8 oz (74.3 kg), last menstrual period 09/05/2015, SpO2 91 %.  Intake/Output from previous day: 02/13 0701 - 02/14 0700 In: 3878.6 [I.V.:1603.4; NG/GT:1060; IV Piggyback:965.2] Out: 4379 [Urine:26]  Physical Exam:  HEENT: ET tube in place Abdomen: Distended Lungs: Rhonchi/wheezing at the upper anterior chest bilaterally Cardiac: Distant heart sounds  Neurologic: Unresponsive  Lab Results:  Recent Labs  09/29/15 0449 10-16-15 0505  WBC 56.6* 61.7*  HGB 8.3* 8.1*  HCT 26.6* 26.3*  PLT 80* 66*   ANC 53.7   BMET  Recent Labs  09/29/15 1600 Oct 16, 2015 0505  NA 141 138  K 4.2 4.9  CL 103 100*  CO2 22 18*  GLUCOSE 285* 277*  BUN 28* 23*  CREATININE 0.67 0.63  CALCIUM 8.6* 8.1*    Studies/Results: Dg Chest Port 1 View  2015-10-16  CLINICAL DATA:  Respiratory failure.  Endotracheal tube positioning. EXAM: PORTABLE CHEST 1 VIEW COMPARISON:  09/28/2015 FINDINGS: The tip of the endotracheal tube is 4.6 cm above the carina. Nasogastric tube enters the stomach. The patient is rotated to the bright on today's radiograph, reducing diagnostic sensitivity and specificity. Dense consolidation at the right lung base potentially with pleural fluid on the right. Patchy and nodular airspace opacities in the left mid lung and left lung base with bilateral interstitial accentuation. There is some slight improvement in aeration in the right upper lobe. IMPRESSION: 1. Endotracheal tube satisfactorily position, tip 4.6 cm above the carina. 2. Minimal improved aeration in the right upper lobe, but otherwise stable bilateral interstitial and airspace opacities with patchy  nodular components compatible with known pulmonary metastatic disease. Electronically Signed   By: Van Clines M.D.   On: Oct 16, 2015 07:21    Medications: I have reviewed the patient's current medications.  Assessment/Plan:  1. Metastatic carcinoma with a dominant right lung mass, bilateral lung masses, chest/abdominal lymphadenopathy, and a pelvic mass  Bronchoscopy 09/01/2015 revealed endobronchial lesions at the right upper lobe and right middle lobe, biopsies revealed malignant cells without a definitive diagnosis  CT-guided biopsy of a respiratory lymph node 09/04/2015 confirmed poorly differential carcinoma, with some features of a neuroendocrine carcinoma.  Cycle 1 etoposide/carboplatin 09/10/2015  CTs chest, abdomen, and pelvis on 09/19/2015-mild decrease in dominant right lower lobe mass, new cell adenopathy, and pulmonary masses. Slight enlargement of the pelvic mass.  MRI cervical spine 09/22/2015 with diffuse bone metastases, no epidural tumor  2. Respiratory failure secondary to #1 and pneumonia, and possibly motor weakness secondary to deconditioning or a paraneoplastic syndrome  3. Renal failure-CRRT resumed on 09/27/2015  4. Left leg and right upper extremity DVTs 09/09/2015, treated with heparin, discontinued secondary to severe thrombocytopenia  5.  Altered mental status secondary to uremia, hypercalcemia, and respiratory failure  6.  Anemia secondary to phlebotomy, metastatic carcinoma, and renal failure, status post a red cell transfusion 09/15/2015  7.  Leukopenia/thrombocytopenia secondary to chemotherapy, improved, now with reactive leukocytosis  8.  Extremity weakness-most likely related to deconditioning/critical illness, paraneoplastic panel negative.  9.  Fever, treated with Diflucan for treatment of Candida in the sputum, blood culture from 09/24/2015-positive for coag negative staph  10. Hypercalcemia-status post pamidronate  09/24/2015-improved  Ms. Hamstra is now  at day 21 following a first cycle of etoposide/carboplatin.   She has progressive respiratory failure with respiratory acidosis. She had a cardiac arrest this morning. I discussed the situation with her family. She is not a candidate for further chemotherapy. I recommend comfort care. I discussed the case with the critical care service. The family agrees with comfort care.           LOS: 31 days   [place]  10-22-2015, 12:54 PM

## 2015-10-15 NOTE — Progress Notes (Signed)
Patient passed away at 1357. Patient's husband, brother, and father were at the bedside. It was explained to the family that the patient's heart had stopped beating. Loss of heartbeat was auscultated and patient's pulse was palpated for one full minute by myself, and Bri Mcnabb, RN for one full minute. MD was made aware and was present at bedside shortly after death.

## 2015-10-15 NOTE — Progress Notes (Signed)
eLink Physician-Brief Progress Note Patient Name: Monique [name] DOB: 1975/12/04 MRN: [id number]   Date of Service  October 15, 2015  HPI/Events of Note  Patient s/p brady/low sat arrest to PEA.  Received EPI and bicarb with ROSC.  Now with VBG showing ongoing resp acidosis with pH 6.97/78/   /17  eICU Interventions  Plan: Increase TV to 450 cc 2 additional amps of bicarb IVP Continue with CRRT and bicarb infusions Recheck ABG in 2 hours     Intervention Category Major Interventions: Acid-Base disturbance - evaluation and management;Code management / supervision  Lorik Guo,[place] 10/15/2015, 5:46 AM

## 2015-10-15 NOTE — Progress Notes (Signed)
PCCM PROGRESS NOTE  ADMISSION DATE: 08/21/2015 CONSULT DATE: 08/30/2015 REFERRING PROVIDER: Dr. Roel Cluck, [facility]  BRIEF Hx:  40 yo female with no PMH admitted 1/13 with progressive dyspnea, wheezing. 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  S/p chemo -carbo + etoposide.  Failed extubation x 1.  Now with diffuse weakness ? CIN vs paraneoplastic - no cord compression on MRI.   MICROBIOLOGY: Tracheal Asp Ctx 2/12>>> Blood Ctx x2 2/8:  1/2 Coag Neg Staph Trach Asp Ctx 2/4:  Candida Blood Ctx x2 2/4:  Negative Urine Ctx 2/4:  Yeast  BAL 1/22:  Candida Blood Ctx x2 1/21:  Negative  Trach Asp 1/21:  Candida Blood Ctx x2 1/13:  Negative HIV 1/14:  Negative Urine Strep Ag 1/14:  Negative Urine Legionella Ag 1/14:  Negative C diff PCR 1/15:  Negative  ANTIBIOTICS: Ceftaz 2/4 - 2/11 Unasyn 2/3 - 2/4 Vanc 2/4 - 2/9 Difulcan 2/7 - 2/9 Rocephin 1/13 Zithromax 1/13 Vancomycin 1/14 - 1/19, restart 1/21>>>off Zosyn 1/14 - 1/20 Ceftaz 1/21 >> off Dapto 2/12 >> 2/13 off (inactivated by lung surfactant) Vanoc 2/12 >> 2/13  Merrem 2/12 >>  Ceftaroline 2/13 >>     LINES/TUBES: R Fem Temp HD CVL 2/10 >> OETT 8.0 2/2 >> Foley 2/7 >> OGT 2/2 >> Rectal Tube >> PIV x1 Rt PICC 1/15 - 1/24 Left IJ CVL 1/24 - 2/9 HD cath 1/27 - 2/8  STUDIES: CT chest 1/13: multiple b/l nodules, mass like consolidation Rt mid lung, Rt hilar and subcarinal mass, Lt hilar LAN, 2.7 cm Rt paratracheal LAN CT abd/pelvis 1/14: 10 cm mass superior to uterus Tumor Markers 1/15: CA 19-9 453, CEA 3.9, CA 125 780.8 Bronchoscopy 1/16: crush artifact ?small cell lung cancer (non diagnostic) lower ext doppler 1/24: left DVT peroneal vein EEG 1/28:  Generalized nonspecific dysfunction/encephalopathy. No seizure activity. CT chest abdomen pelvis 2/4: Right lung mass smaller, surrounding consolidation noted,  left lung nodules smaller, pelvic mass smaller Port CXR 2/12:  ETT in good position. No change in bilateral nodules & RLL opacification.  EVENTS: 1/13 Admit 1/15 Gyn oncology consulted 1/16 Bronchoscopy in ICU; Placed on BiPAP for wheezing, dyspnea 1/19 IR biopsy >> Poorly differentiated high Grade Carcinoma >stains favor Neuroendocrine. TTF stain was NEGATIVE 1/22 bronch cytology: neg  1/25 Etoposide/carboplatin cycle #1 1/28 EEG>>> diffuse slowly (performed while uremic) 1/31 weaning  2/01 gave fluid challenge and changed to even volume status.  2/02 passed SBT. Extubated. Re-intubated 2/03 again approached family re: goals of care. They want full support 2/05 neurology consult  2/09 Off CVVHD, some UOP, rising BUN/CR, tmax 100 2/11 Restarted CVVHD due to worsening volume status & renal function 2/12 Increasing ETT secretions & peak pressures 2/14 Desaturations with brady to PEA arrest, 6 min CPR  SUBJECTIVE:   Episode of desaturation with brady to PEA arrest early am.  Required ~ 6 minutes CPR before ROSC.       REVIEW OF SYSTEMS:  Unable to obtain as patient is intubated.  VITAL SIGNS: BP 112/75 mmHg  Pulse 139  Temp(Src) 97 F (36.1 C) (Core (Comment))  Resp 35  Ht 5' 5.5" (1.664 m)  Wt 163 lb 12.8 oz (74.3 kg)  BMI 26.83 kg/m2  SpO2 96%  LMP 09/13/2015  INTAKE/OUTPUT: I/O last 3 completed shifts: In: 4921.8 [I.V.:1680.6; Other:460; NG/GT:1716; IV Piggyback:1065.2] Out: 7006 [Urine:30; DDUKG:2542]  General: Eyes closed. Husband, father, brother at bedside.  No distress. HENT: OETT in place. No scleral injection. PULM: Coarse breath sounds bilaterally. Symmetric chest rise on ventilator. CV: Regular rhythm. Unable to appreciate JVD. Anasarca. GI: Soft. Bowel sounds present. Protuberant. Integument:  Warm & dry. No rash on exposed skin. Neuro: Not following commands. Eyes closed. No response to voice. No withdrawal to pain in all 4 extremities.  No gag with suction.   Does not appear to be in pain.  CBC Recent Labs     09/28/15  0442  09/28/15  1930  09/29/15  0449  10-07-15  0505  WBC  43.0*   --   56.6*  61.7*  HGB  6.8*  8.0*  8.3*  8.1*  HCT  21.3*  25.2*  26.6*  26.3*  PLT  79*   --   80*  66*    Coag's Recent Labs     09/28/15  0442  09/29/15  0449  Oct 07, 2015  0505  APTT  '26  27  28    '$ BMET Recent Labs     09/29/15  0449  09/29/15  1600  07-Oct-2015  0505  NA  141  141  138  K  4.1  4.2  4.9  CL  102  103  100*  CO2  23  22  18*  BUN  31*  28*  23*  CREATININE  0.80  0.67  0.63  GLUCOSE  214*  285*  277*    Electrolytes Recent Labs     09/28/15  0442   09/29/15  0449  09/29/15  1600  October 07, 2015  0505  CALCIUM  8.7*   < >  8.9  8.6*  8.1*  MG  2.7*   --   2.7*   --   2.7*  PHOS  1.8*   < >  1.5*  3.0  3.8   < > = values in this interval not displayed.    Sepsis Markers No results for input(s): PROCALCITON, O2SATVEN in the last 72 hours.  Invalid input(s): LACTICACIDVEN  ABG Recent Labs     09/28/15  0425  09/29/15  1527  10/07/15  0818  PHART  7.261*  7.139*  7.087*  PCO2ART  53.3*  69.9*  62.6*  PO2ART  66.8*  52.3*  CRITICAL RESULT CALLED TO, READ BACK BY AND VERIFIED WITH:    Liver Enzymes Recent Labs     09/29/15  0449  09/29/15  1600  2015-10-07  0505  ALBUMIN  2.5*  2.4*  2.1*    Cardiac Enzymes No results for input(s): TROPONINI, PROBNP in the last 72 hours.  Glucose Recent Labs     2015/10/07  0533  GLUCAP  265*    Imaging Dg Chest Port 1 View  October 07, 2015  CLINICAL DATA:  Respiratory failure.  Endotracheal tube positioning. EXAM: PORTABLE CHEST 1 VIEW COMPARISON:  09/28/2015 FINDINGS: The tip of the endotracheal tube is 4.6 cm above the carina. Nasogastric tube enters the stomach. The patient is rotated to the bright on today's radiograph, reducing diagnostic sensitivity and specificity. Dense consolidation at the right lung base potentially with pleural fluid on the right. Patchy and  nodular airspace opacities in the left mid lung and left lung base with bilateral interstitial accentuation. There is some slight improvement in aeration in the right upper lobe. IMPRESSION: 1. Endotracheal tube satisfactorily position, tip 4.6 cm above the carina. 2. Minimal improved aeration in the right upper lobe, but otherwise stable bilateral interstitial and airspace opacities with patchy  nodular components compatible with known pulmonary metastatic disease. Electronically Signed   By: Van Clines M.D.   On: 10/06/15 07:21    ASSESSMENT/PLAN:  HEMATOLOGY/ONCOLOGY A: Poorly Differentiated High Grade Carcinoma - Advanced & treating as Small Cell, stains favor Neuroendocrine, chemotherapy completed on 1/25 x1 dose Anemia - No signs of active bleeding. Transfused 1/30. DVT - left peroneal vein and Right UE extending into the Subclavian, PICC removed. 2/6 Heparin stopped due to thrombocytopenia. Pancytopenia - Secondary to chemotherapy.  P: Oncology following, notes reflect they recommend no further chemotherapy Neupogen per Oncology Hgb/Hct post transfusion SCDs  PULMONARY A: Acute Hypoxic / Hypercarbic Respiratory Failure - Multifactorial from lung cancer & neuromuscular weakness. HCAP Lingula & LLL Lung - Seen on 2/4 CT. S/P BAL.  P: Full Support w/ 6cc/kg IBW Rate increased to 35 Xopenex nebs q6hr Add low dose morphine gtt for comfort - '2mg'$  /hr   NEUROLOGY A: Pain - Secondary to Cancer. Acute Encephalopathy - Improved after HD. Intermittent delirium due to hypercarbia.   Profound Neuromuscular Weakness - MRI C-spin w/ bone mets but w/o cord compression. 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  Paraneoplastic profile is negative.    P: Morphine gtt for comfort PT as tolerated Paraneoplastic Profile negative (see paper chart for results)  CARDIAC A: Sinus Tachycardia Loletha Grayer to PEA Arrest - 2/14 with 6 min CPR  P: Continue telemetry monitoring DNR  but continue other medical care for now.  Family feels stopping therapy at this point would be too quick of passing and they would like a little more time with her to say goodbye.   RENAL A: Acidosis - respiratory + metabolic  Acute Renal Failure - Oliguric. Restarted on CVVHD 2/11 Hypophosphatemia  P: Nephrology following Bicarb gtt CVVHD per recommendations Trending BUN/Creatinine daily for renal function Daily electrolyte panel Replace electrolytes as indicated   GASTROENTEROLOGY A: Severe Protein Calorie Malnutrition Loose Stool - Resolved. C diff negative. Pancreatic Inflammation on CT - Lipase normal.  P: Hold TF 2/14 Protonix via tube daily Protonix for SUP Imodium PRN   INFECTION A: LLL & Lingula HCAP - Seen on CT. FUO 2/8 - Line sepsis vs Tumor vs DVT. Thought to be secondary to timing of chemo. Oral Candidiasis - Completed Dilfucan. Possible Fungal UTI - Treated w/ Diflucan as well & foley replaced ~2/7.  P: Follow cultures as above Empiric Merrem Day #3 Fortaz finished 2/11 Finished line holiday. Hold vanco for now given renal concerns, unable to give dapto as inactivated by lung surfactant, no linezolid with thrombocytopenia >> empiric ceftaroline for now.  Although, no MRSA hx.   ENDOCRINE A: Hyperglycemia - BG controlled. No h/o DM.  P: Monitor glucose on BMP daily   Family:  Husband & brother updated on patients status.  Discussed plan of care post arrest.  They understand that she is likely to pass despite aggressive support.  At this time, they would like to initiate a morphine gtt for comfort and no further CPR in the event of arrest.  We discussed the option of withdrawal of mechanical ventilation and CVVHD but they feel it will be too quick and would like time with her to say goodbye and to allow her son time with his mother.     Noe Gens, NP-C [facility] Pgr: [phone] or if no answer [phone] 10-06-2015, 8:50  AM   PCCM Attending Note: I have seen and examined the patient with nurse practitioner. Please refer to her  note which I reviewed in detail. 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 Patient continues to decline. She suffered a cardiac arrest briefly this morning. Her family have been updated by medical oncology which I personally spoke with. Her condition appears terminal and family are very appreciative of the excellent care that nursing staff had been administering. Her acidemia continues to worsen despite bicarbonate infusion and maximal ventilatory support. I believe it's only a matter of time before she suffers another cardiac arrest. Family have agreed to initiate a morphine infusion for comfort and to not resuscitate.  I have spent a total of 31 minutes of critical care time today caring for the patient, reviewing the patient's electronic medical record,discussing the patient's current clinical state and prognosis as well as plan with her family and friend at bedside.  Sonia Baller Ashok Cordia, M.D. [facility] Pager: [phone] After 3pm or if no response, call [phone]

## 2015-10-15 NOTE — Discharge Summary (Signed)
DEATH NOTE: For a complete accounting of the patient's history and physical exam on presentation please refer to the H&P dictated on 09/10/2015. In brief the patient was a 40 year old Caucasian female presenting with approximately one-month history of worsening shortness of breath and wheezing. Patient was evaluated by her primary care physician and treated for presumed pneumonia based on x-ray imaging with a course of antibiotics. Ultimately the patient had undergone 3 separate courses of antibiotics without significant improvement in symptoms. At the time of admission the patient reported poor appetite with weight loss but no fevers, chills, or hemoptysis. Pulmonary medicine was consulted on 1/14 and the patient underwent bronchoscopy with endobronchial brushings from the right upper and middle lobes as well as forceps biopsies from the right upper lobe and right middle lobe bronchi with washings. A station 7 Wang needle biopsy was performed as well. Scattered atypical cells were noted favoring small cell lung cancer but there was insufficient tissue to further characterize. The patient became progressively more hypoxic on BiPAP and was electively intubated on 09/04/15 in preparation for interventional radiology biopsy of retroperitoneal lymph node. Biopsy demonstrated a poorly differentiated neuroendocrine malignancy. The patient was treated with 1 cycle of etoposide and carboplatin on 09/10/15. The patient's respiratory status improved such that she was able to pass a spontaneous breathing trial and on 2/2 was extubated briefly. She was reintubated for worsening respiratory failure the same day. The patient subsequently developed worsening acute renal failure that progressed to the point of requiring continuous renal replacement therapy. Patient's mental status to seem to fluctuate at times and a neurology consult along with EEG was performed. There was no evidence for epileptiform activity. With worsening  respiratory status and fever the patient was treated with multiple courses of broad-spectrum antibiotics. A repeat bronchoscopy was performed on 09/07/15 with repeat cultures performed on the right middle and lower lobes. The patient developed worsening neuromuscular weakness and although imaging showed metastasis of her malignancy there was no evidence for spinal cord compression. Paraneoplastic panel was sent and subsequently found to be negative. As the patient finished her last course of broad-spectrum antibiotics her mental status continued to remain poor. Sedation and pain medication was held at the request of her husband and family for many days prior to her demise to ensure this was not further confounding her neurological status. Continuous renal replacement therapy was briefly stopped to determine if the patient could demonstrate any evidence of renal recovery. It subsequently had to be restarted with worsening volume overload and continued renal failure. Just days after completing her last course of broad-spectrum antibiotics. The patient developed worsening respiratory secretions with worsening metabolic acidosis despite continuous renal replacement therapy and IV bicarbonate infusion. Her acute hypoxic respiratory failure rapidly deteriorated despite maximal efforts to optimize the patient on the ventilator. Septic shock continued to worsen as well and the patient suffered a brief cardiac arrest on the morning of 2/14. After multiple discussions with the critical care service and medical oncology the patient's family agreed to make her a full DO NOT RESUSCITATE and agreed to a morphine infusion to ensure patient's comfort. Ultimately the patient continued to deteriorate and at 1:57 PM on Oct 21, 2015 the patient had no heartbeat on auscultation or a palpable pulse. Spontaneous respirations had stopped. Hospital chaplain and family were at bedside.  DIAGNOSES AT DEATH: 1. Septic shock  2. Multisystem organ  failure  3. Acute hypoxic respiratory failure  4. Acute renal failure  5. Acute Encephalopathy 6. Metabolic Acidosis 7. Lactic  Acidosis 8. Postobstructive healthcare associated pneumonia 9. Intensive care unit polyneuropathy 10. Severe protein calorie malnutrition 11. Fungal urinary tract infection 12. Hyperglycemia 13. 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

## 2015-10-15 NOTE — Progress Notes (Signed)
   23-Oct-2015 1600  Clinical Encounter Type  Visited With Family  Visit Type Follow-up;Psychological support;Spiritual support;Critical Care;Death  Referral From Nurse  Consult/Referral To Chaplain  Spiritual Encounters  Spiritual Needs Grief support;Emotional;Literature;Prayer;Ritual  Stress Factors  Family Stress Factors Loss;Major life changes;Exhausted   I was called in to do hand prints of the patient for the patient's family members, particularly her son.  I did 5 hand prints on canvas for the family. All of the family members were present and her husband and friend participated in doing those which was healing for them.  The family requested prayer and we all held hands to pray around [place]. Her monitor started to sound off and the family believed that was her way of acknowledging that we were praying for her.  The husband stated that he was at peace, but that this was extremely painful. The entire family and friends exhibited appropriate grief for the patient.  I worked with the family until the patient died and continued to work with them after she passed.  I supplied the family with grief materials from [facility] on helping a child work through grief.  I also supplied them with a list of funeral homes in the area.  I provided the family with grief support and other needs while they were saying their goodbyes to the patient.    [place] M.Div.

## 2015-10-15 NOTE — Progress Notes (Signed)
Spoke with NP about patient's current status. Per MD do not increase vasopressors above current level of administration. Will continue to monitor patient.

## 2015-10-15 DEATH — deceased

## 2015-10-28 ENCOUNTER — Encounter: Payer: Self-pay | Admitting: Oncology

## 2015-10-28 ENCOUNTER — Encounter: Payer: Self-pay | Admitting: *Deleted

## 2015-10-28 NOTE — Progress Notes (Signed)
left in pod-patient deceased

## 2015-10-29 ENCOUNTER — Encounter: Payer: Self-pay | Admitting: Oncology

## 2015-10-29 NOTE — Progress Notes (Signed)
Placed cancer policy for dr to sign on his desk-sherrill

## 2015-10-31 ENCOUNTER — Encounter: Payer: Self-pay | Admitting: Oncology

## 2015-10-31 NOTE — Progress Notes (Signed)
left mess for chris form at front desk with ms wilma

## 2016-01-30 ENCOUNTER — Other Ambulatory Visit: Payer: Self-pay | Admitting: Nurse Practitioner

## 2017-07-06 IMAGING — CR DG CHEST 2V
2 series · 2 of 2 positions shown · non-contrast
Comparison: None in PACs

CLINICAL DATA: Persistent wheezing cough and shortness of breath
for the past 3 weeks; unresponsive to steroids.

EXAM:
CHEST  2 VIEW

[w chest pa]
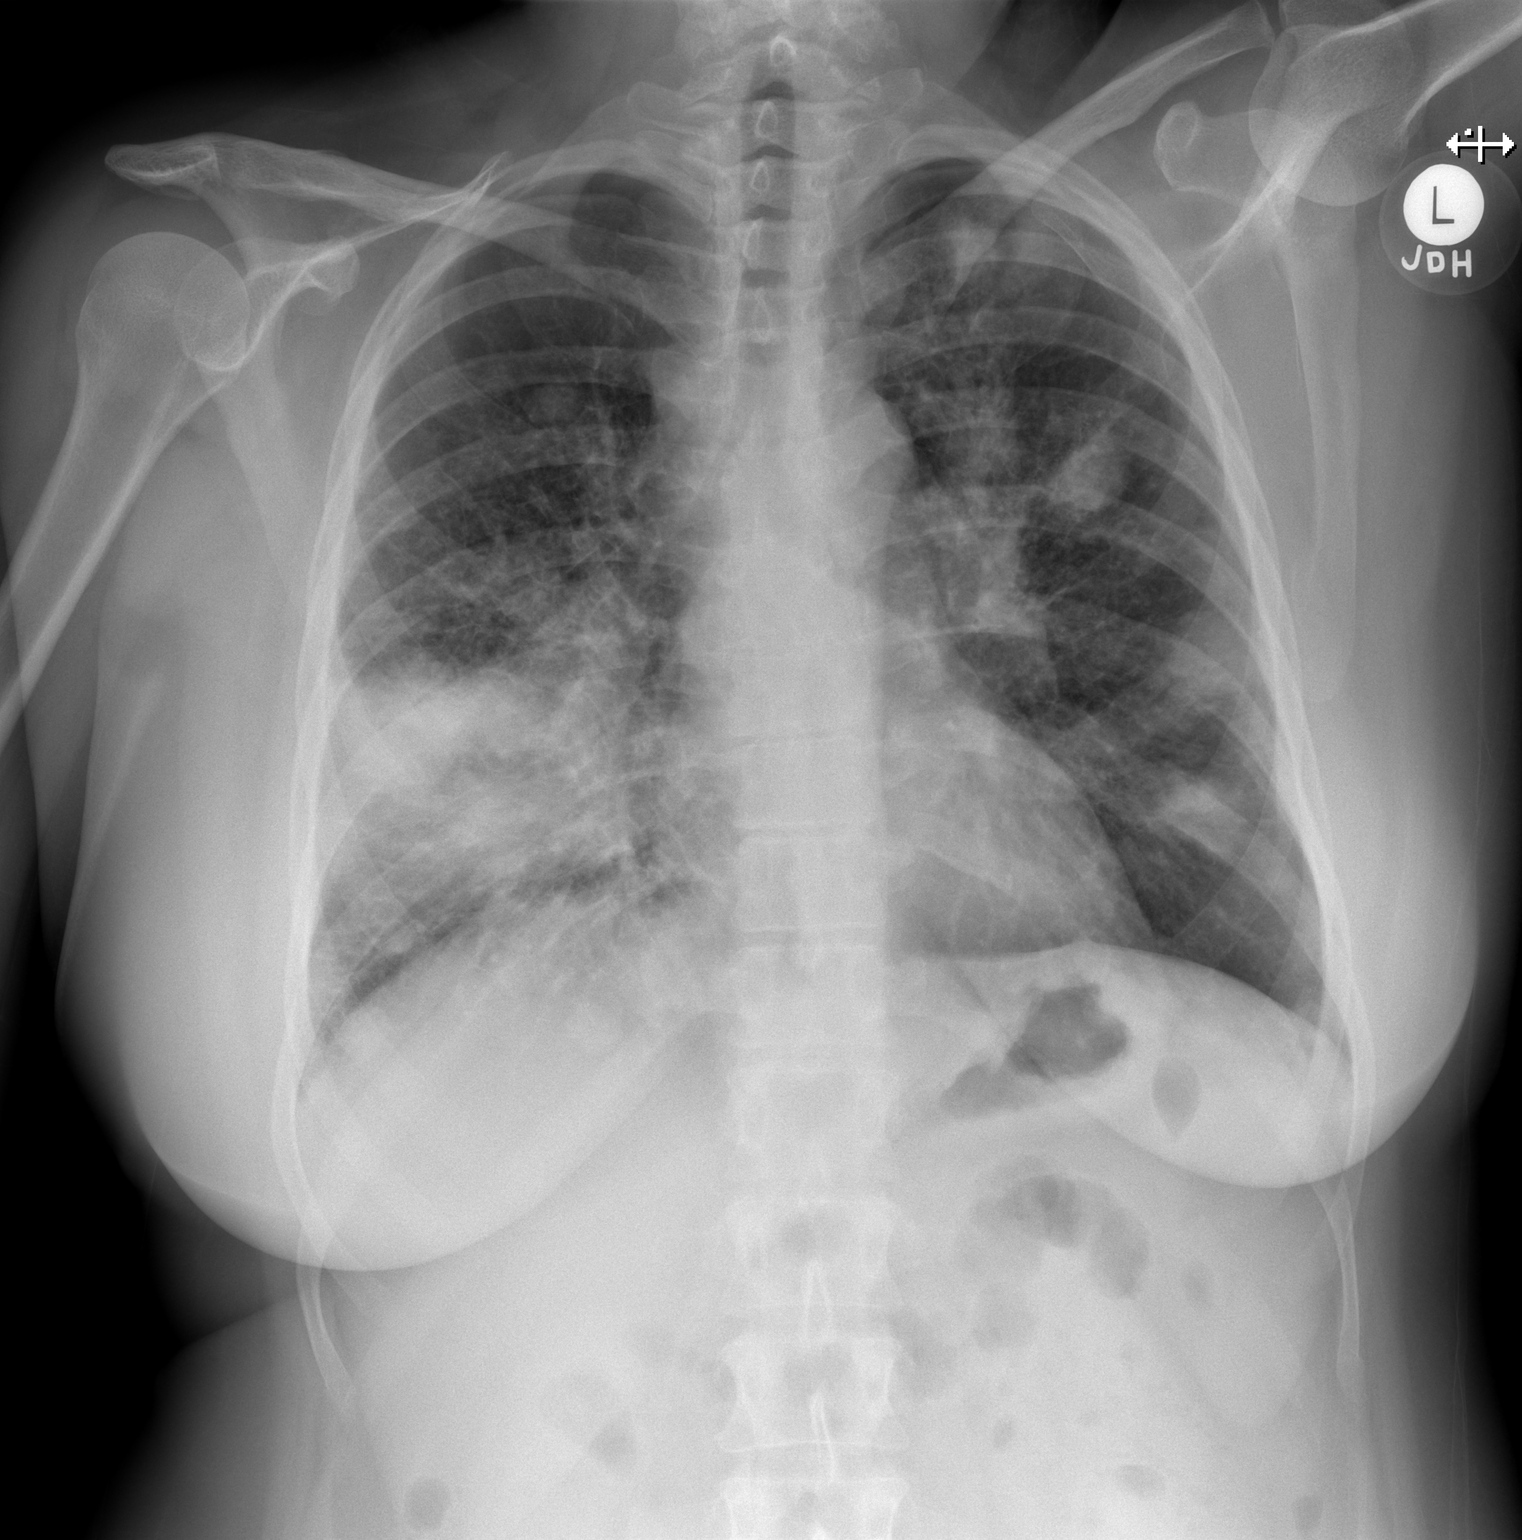

[w chest lat]
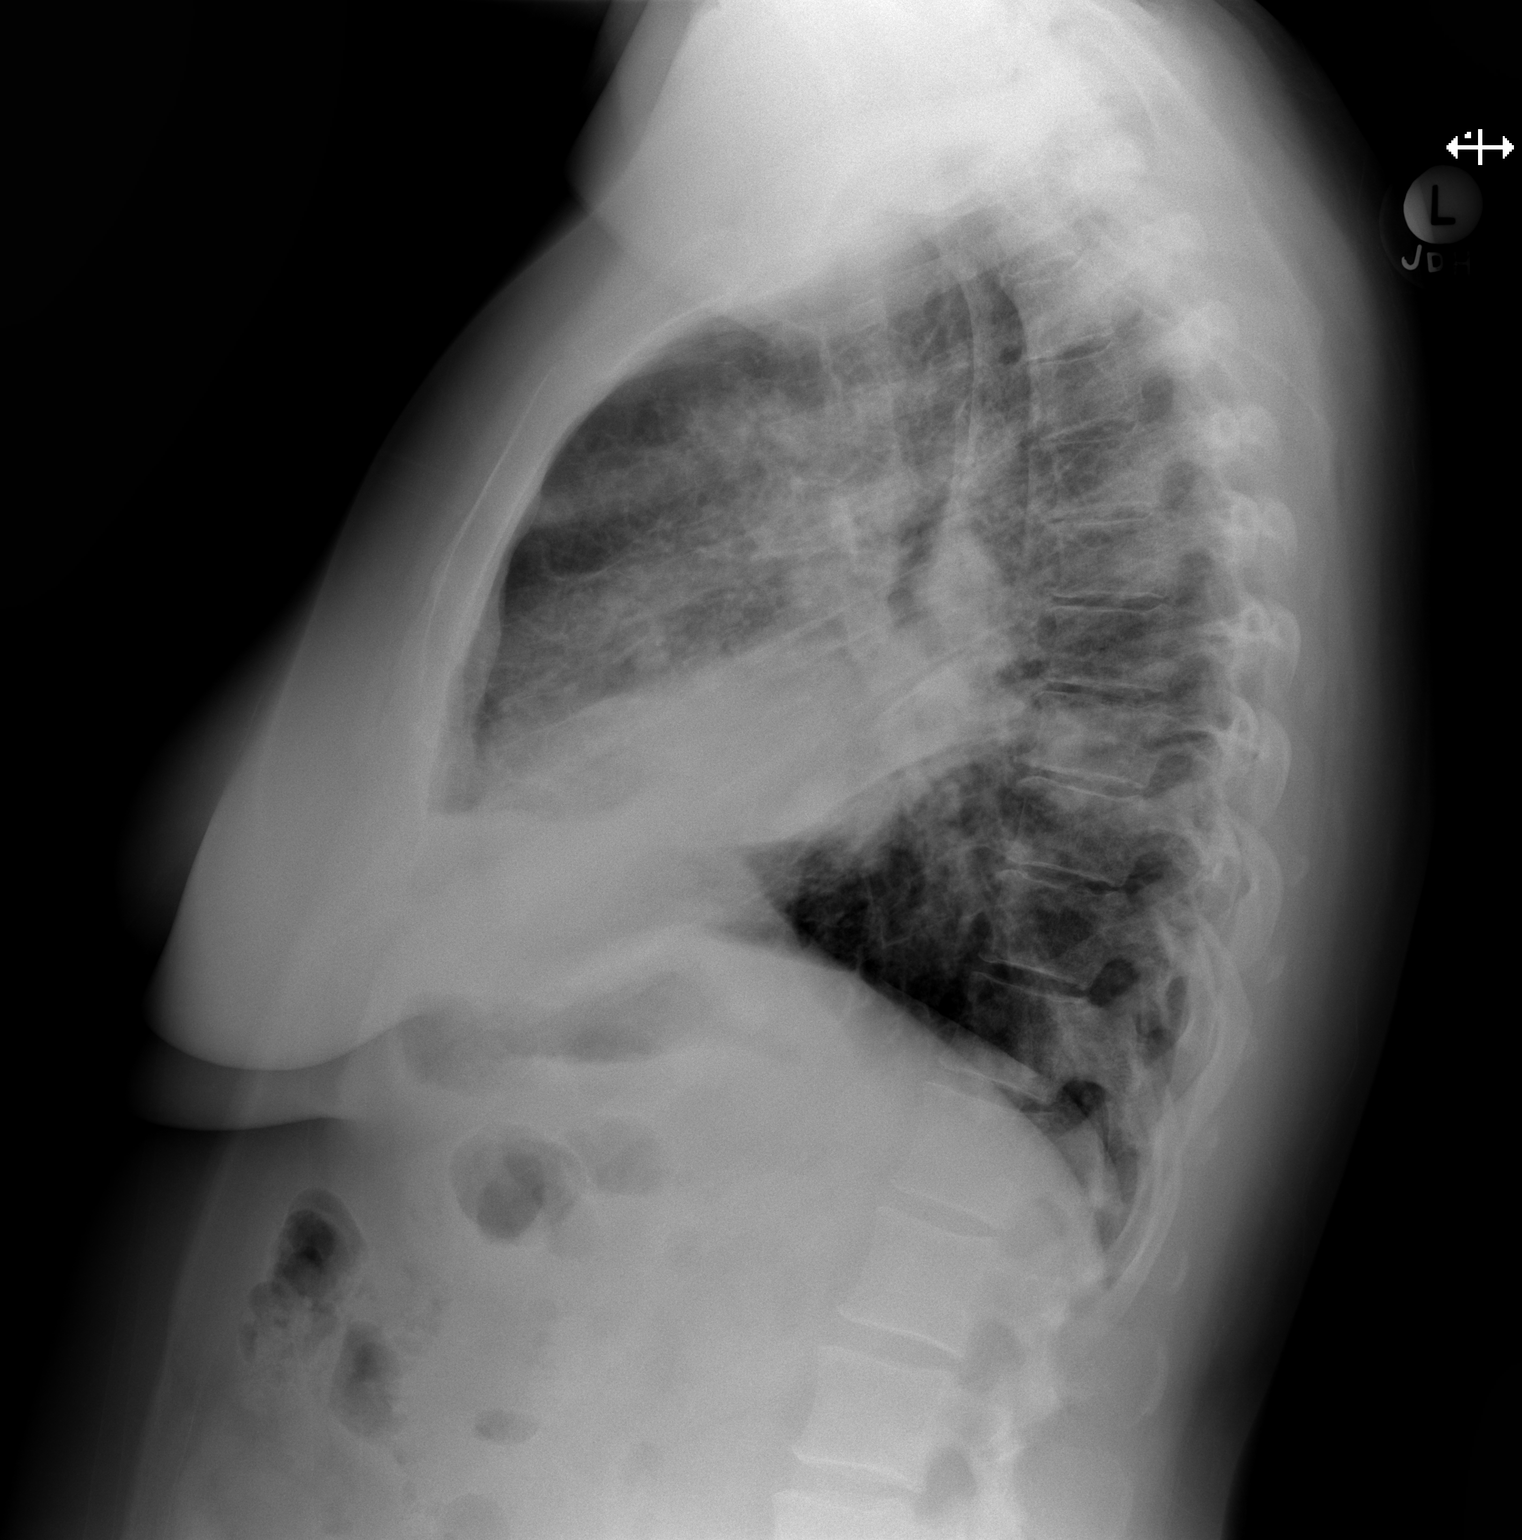

[2 of 2 positions shown; findings below may reference images not displayed]

FINDINGS: There are confluent alveolar opacities occupying much of the right
middle lobe with patchy areas of similar opacity in the left lung
and right upper lobe. There is no pleural effusion. The heart and
pulmonary vascularity are normal. The bony thorax is unremarkable.
IMPRESSION: Multifocal alveolar opacities bilaterally most compatible with
pneumonia. The right middle lobe appears the most significantly
involved.

These results will be called to the ordering clinician or
representative by the Radiologist Assistant, and communication
documented in the PACS or zVision Dashboard.

## 2017-07-09 IMAGING — CT CT ANGIO CHEST
2 of 6 series · 18 of 36 positions shown · IV contrast (OMNIPAQUE 350)
Comparison: Chest x-ray 08/29/2015 and 08/26/2015

CLINICAL DATA: Patient states ill since [REDACTED], diagnosed with
pneumonia 4 days ago on antibiotics with increase shortness of
breath this morning.

EXAM:
CT ANGIOGRAPHY CHEST WITH CONTRAST
TECHNIQUE: Multidetector CT imaging of the chest was performed using the
standard protocol during bolus administration of intravenous
contrast. Multiplanar CT image reconstructions and MIPs were
obtained to evaluate the vascular anatomy.
CONTRAST:  100mL OMNIPAQUE IOHEXOL 350 MG/ML SOLN

[Series 7: thins for pacs · axial · 0.64mm/px · z∈[+1840,+2085]mm · 17 of 273 slices shown]
[im 14/273  lung]
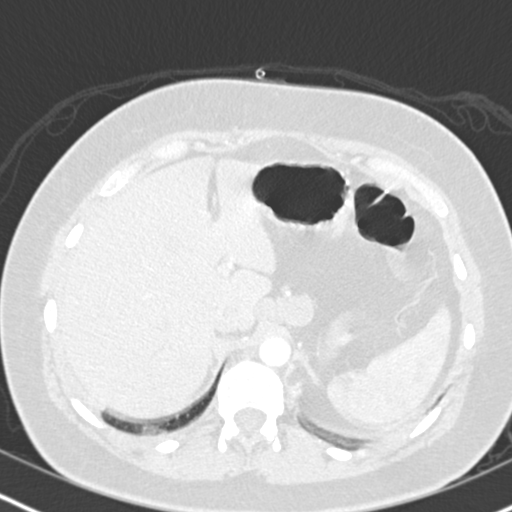
[im 28/273  mediastinal]
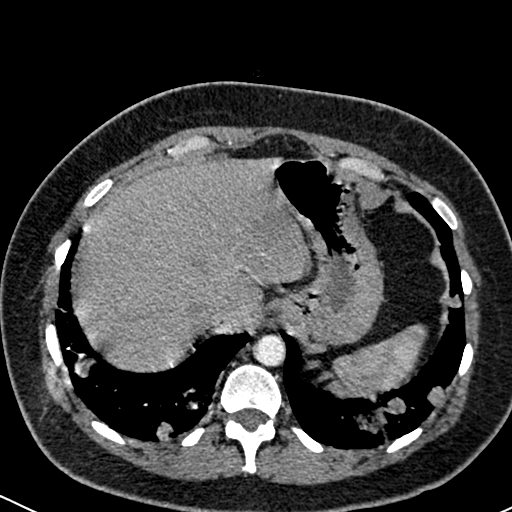
[im 41/273  lung]
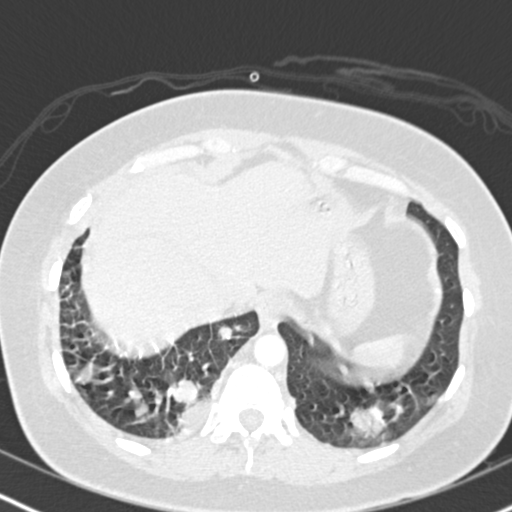
[im 55/273  mediastinal]
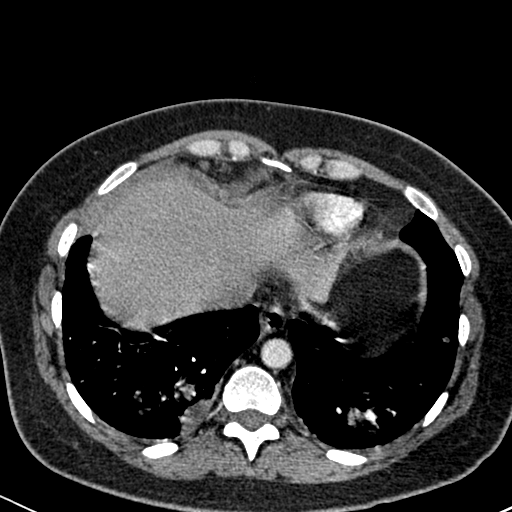
[im 82/273  lung]
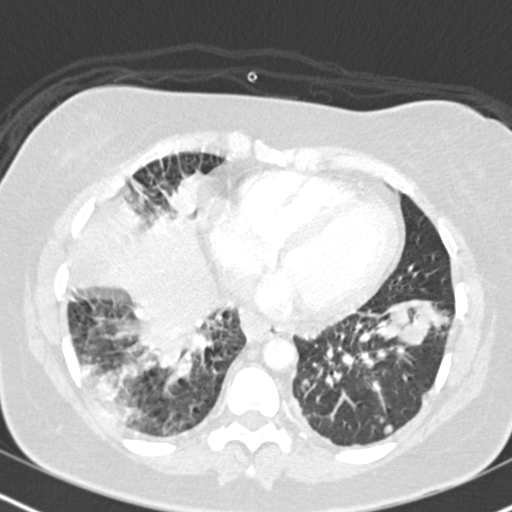
[im 96/273  mediastinal]
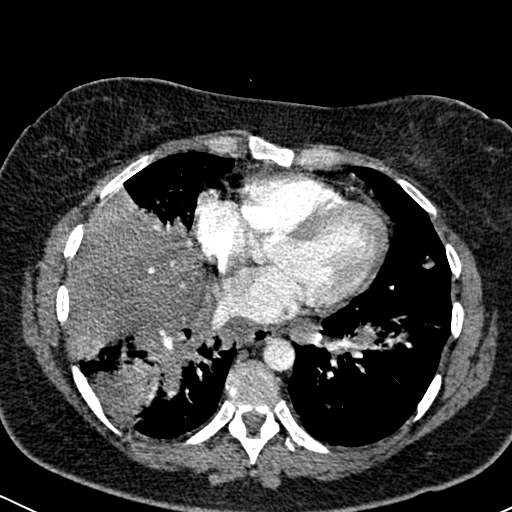
[im 109/273  lung]
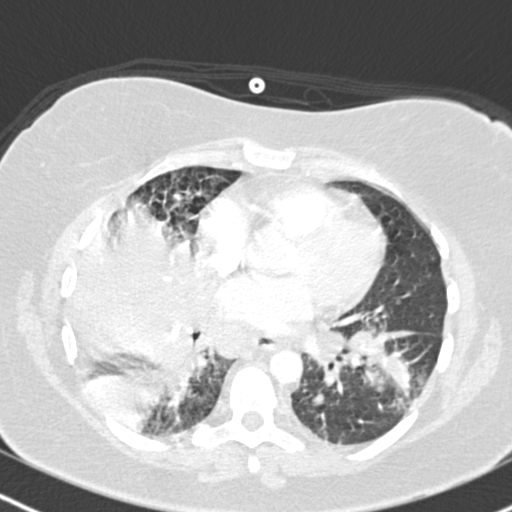
[im 123/273  mediastinal]
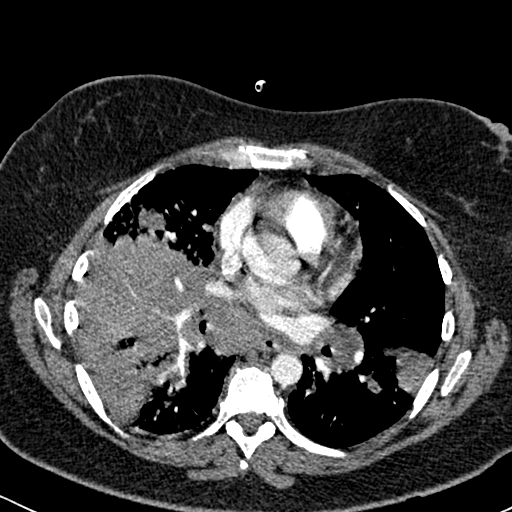
[im 137/273  lung]
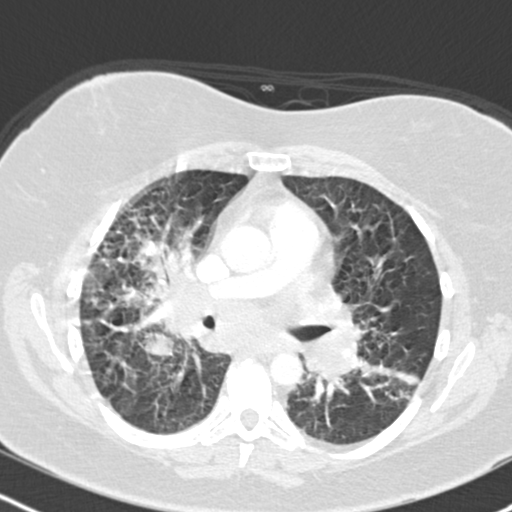
[im 150/273  mediastinal]
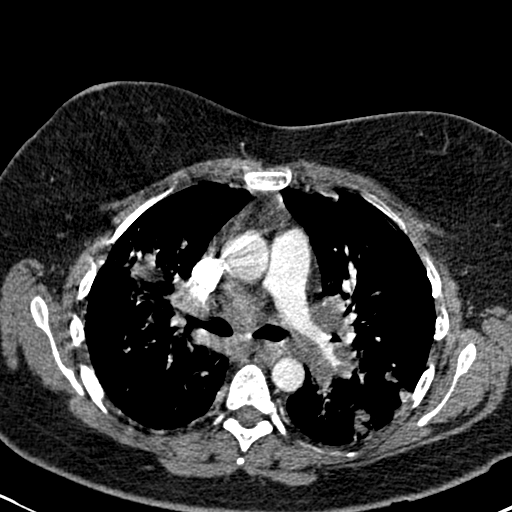
[im 164/273  lung]
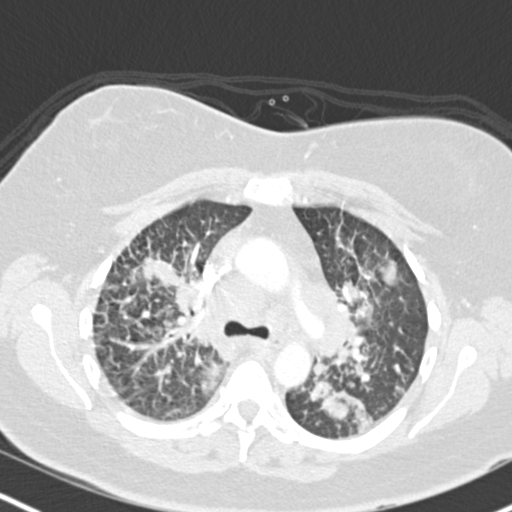
[im 177/273  mediastinal]
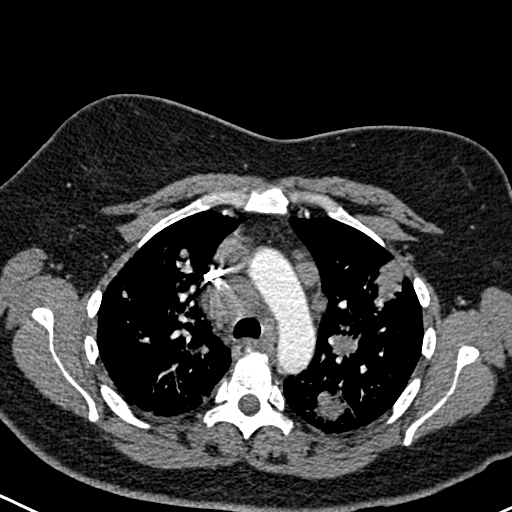
[im 191/273  lung]
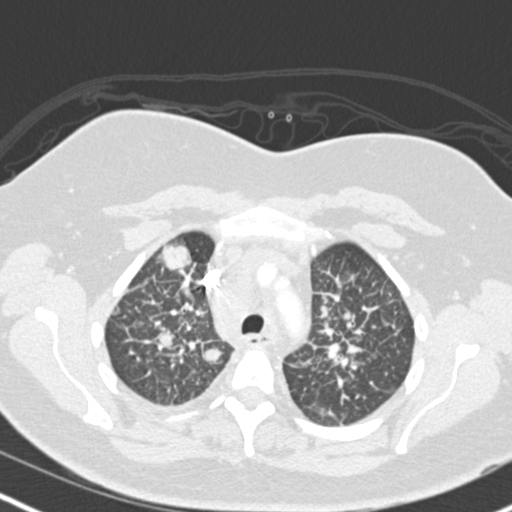
[im 218/273  mediastinal]
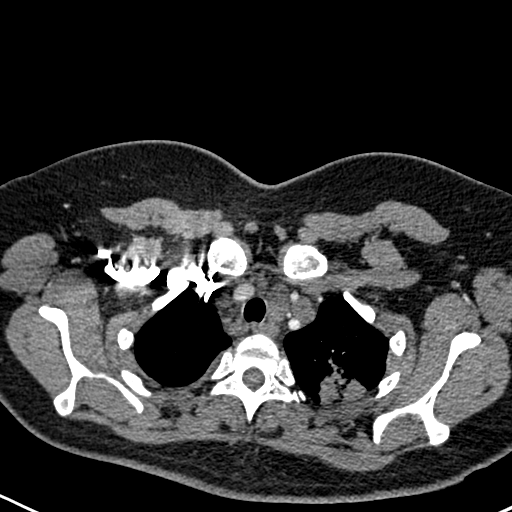
[im 232/273  lung]
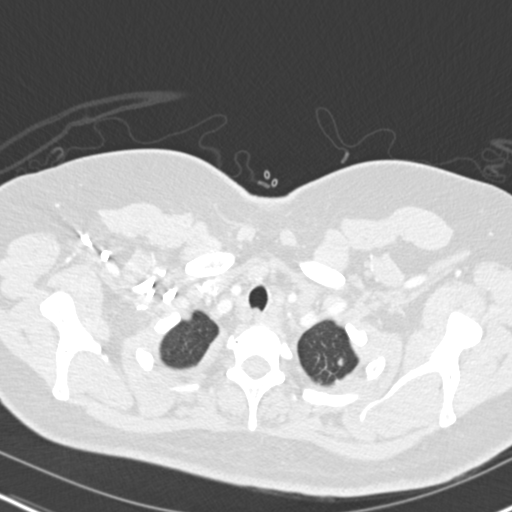
[im 245/273  mediastinal]
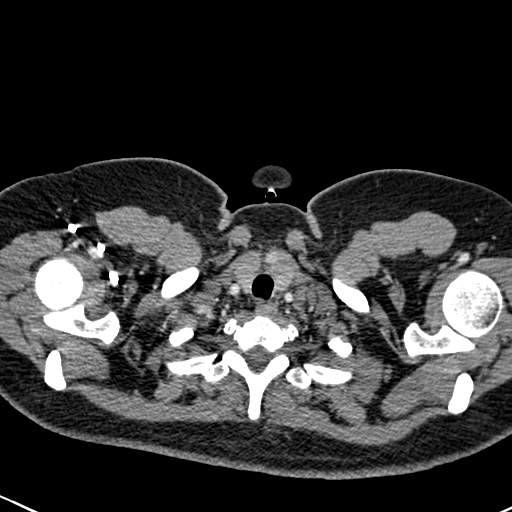
[im 259/273  lung]
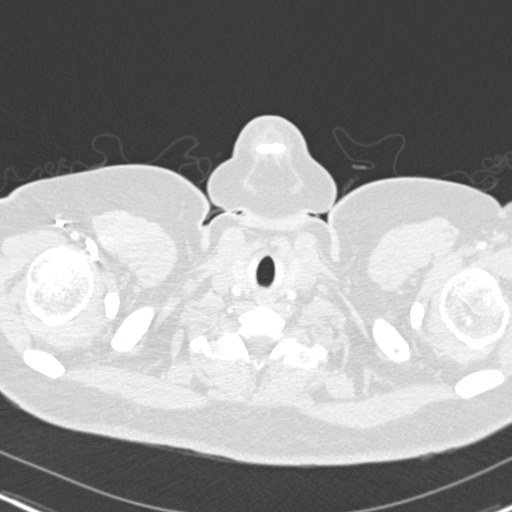

[Series 9: coronal mpr · coronal · 0.56mm/px · 1 of 131 slices shown]
[im 66/131  mediastinal]
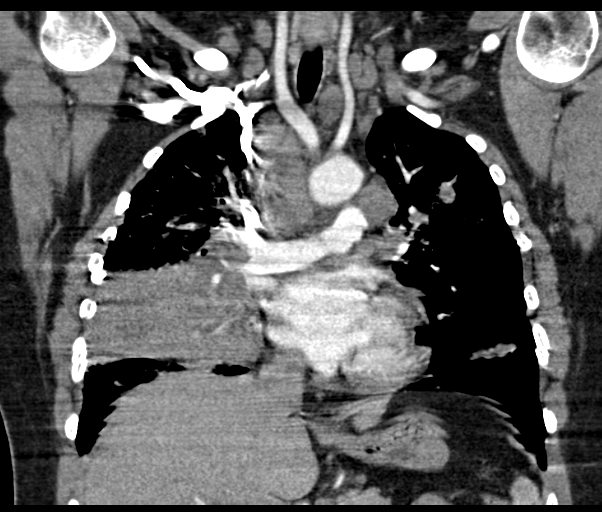

[18 of 36 positions shown; findings below may reference images not displayed]

FINDINGS: Lungs are adequately inflated demonstrate multiple bilateral
discrete nodules and nodular regions of airspace opacification.
There is masslike consolidation over the right midlung continuous
with soft tissue density over the right hilum and subcarinal region.
There is no pleural effusion. There is extensive mediastinal and
bilateral hilar adenopathy. Subcarinal adenopathy measures 3.2 cm by
short axis. A large right peritracheal node measures 2.7 cm by short
axis. Adenopathy extends into the superior mediastinum and neck base
at the level of the thyroid gland bilateral. Findings are concerning
for central right lung neoplasm with metastatic disease. Adenopathy
causes narrowing of the distal aspect of the main pulmonary arteries
bilaterally. There is associated airway obstruction over the central
right middle lobe.

Heart is normal in size. No definite evidence of pulmonary embolism.
No axillary adenopathy.

Images through the upper abdomen demonstrate a few small right
pericardial phrenic lymph nodes. Adenopathy adjacent the
gastroesophageal junction with lymph node measuring 1.8 cm by short
axis.

There are degenerative changes of the spine.

Review of the MIP images confirms the above findings.
IMPRESSION: Large central right middle lobe lung mass without clear borders
likely with mild associated postobstructive atelectasis. Bulky
mediastinal and bilateral hilar adenopathy as described. Multiple
bilateral discrete pulmonary nodules and nodular areas of airspace
opacification likely metastatic disease. Right lung mass obstructs
the right middle lobe bronchus. Adenopathy causes narrowing of the
main pulmonary arteries. Adenopathy over the upper abdomen near the
gastroesophageal junction likely metastatic disease. Recommend
tissue diagnosis.

No evidence of pulmonary embolism.
